# Patient Record
Sex: Female | Born: 1990 | Race: White | Hispanic: No | Marital: Single | Smoking: Current every day smoker
Health system: Southern US, Community
[De-identification: ages and names within clinical notes are randomized; demographics above are authoritative.]

## PROBLEM LIST (undated history)

## (undated) ENCOUNTER — Inpatient Hospital Stay (HOSPITAL_COMMUNITY): Payer: Self-pay

## (undated) ENCOUNTER — Ambulatory Visit (HOSPITAL_COMMUNITY): Admission: EM | Disposition: A | Discharge status: Home / Self Care | Payer: Medicaid Other

## (undated) DIAGNOSIS — B999 Unspecified infectious disease: Secondary | ICD-10-CM

## (undated) DIAGNOSIS — G43909 Migraine, unspecified, not intractable, without status migrainosus: Secondary | ICD-10-CM

## (undated) DIAGNOSIS — D649 Anemia, unspecified: Secondary | ICD-10-CM

## (undated) DIAGNOSIS — J302 Other seasonal allergic rhinitis: Secondary | ICD-10-CM

## (undated) DIAGNOSIS — J45909 Unspecified asthma, uncomplicated: Secondary | ICD-10-CM

## (undated) DIAGNOSIS — S62101A Fracture of unspecified carpal bone, right wrist, initial encounter for closed fracture: Secondary | ICD-10-CM

## (undated) DIAGNOSIS — Z9109 Other allergy status, other than to drugs and biological substances: Secondary | ICD-10-CM

## (undated) HISTORY — PX: INDUCED ABORTION: SHX677

---
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
## 2011-02-02 LAB — URINALYSIS, ROUTINE W REFLEX MICROSCOPIC
Bilirubin Urine: NEGATIVE
Glucose, UA: NEGATIVE mg/dL
Ketones, ur: 15 mg/dL — AB
Protein, ur: NEGATIVE mg/dL
pH: 7.5 (ref 5.0–8.0)

## 2011-02-02 LAB — URINE MICROSCOPIC-ADD ON

## 2011-02-03 LAB — URINE CULTURE
Colony Count: 7000
Culture  Setup Time: 201112110108

## 2011-02-03 LAB — HCG, QUANTITATIVE, PREGNANCY: hCG, Beta Chain, Quant, S: <2 m[IU]/mL (ref <5)

## 2011-02-03 LAB — POCT I-STAT, CHEM 8
BUN: 12 mg/dL (ref 6–23)
Hemoglobin: 14.3 g/dL (ref 12.0–15.0)
Potassium: 3.7 mEq/L (ref 3.5–5.1)
Sodium: 142 mEq/L (ref 135–145)
TCO2: 27 mmol/L (ref 0–100)

## 2011-02-03 LAB — DIFFERENTIAL
Basophils Absolute: 0 10*3/uL (ref 0.0–0.1)
Lymphocytes Relative: 23 % (ref 12–46)
Lymphocytes Relative: 23 % (ref 12–46)
Lymphs Abs: 1.5 10*3/uL (ref 0.7–4.0)
Lymphs Abs: 1.6 10*3/uL (ref 0.7–4.0)
Monocytes Absolute: 0.4 10*3/uL (ref 0.1–1.0)
Monocytes Relative: 5 % (ref 3–12)
Neutro Abs: 4.8 10*3/uL (ref 1.7–7.7)
Neutro Abs: 4.8 10*3/uL (ref 1.7–7.7)
Neutrophils Relative %: 71 % (ref 43–77)

## 2011-02-03 LAB — WET PREP, GENITAL
Trich, Wet Prep: NONE SEEN
Yeast Wet Prep HPF POC: NONE SEEN

## 2011-02-03 LAB — CBC
Hemoglobin: 13.2 g/dL (ref 12.0–15.0)
MCH: 29.3 pg (ref 26.0–34.0)
MCV: 88.6 fL (ref 78.0–100.0)
Platelets: 213 10*3/uL (ref 150–400)
Platelets: 213 10*3/uL (ref 150–400)
RBC: 4.47 MIL/uL (ref 3.87–5.11)
RDW: 13.1 % (ref 11.5–15.5)

## 2011-02-03 LAB — APTT: aPTT: 23 seconds — ABNORMAL LOW (ref 24–37)

## 2011-02-03 LAB — BASIC METABOLIC PANEL
BUN: 9 mg/dL (ref 6–23)
Calcium: 8.9 mg/dL (ref 8.4–10.5)
Chloride: 104 mEq/L (ref 96–112)
Creatinine, Ser: 0.78 mg/dL (ref 0.4–1.2)
GFR calc Af Amer: >60 mL/min (ref >60)
GFR calc non Af Amer: >60 mL/min (ref >60)
Glucose, Bld: 91 mg/dL (ref 70–99)
Potassium: 3.8 mEq/L (ref 3.5–5.1)

## 2011-02-03 LAB — URINALYSIS, ROUTINE W REFLEX MICROSCOPIC
Glucose, UA: NEGATIVE mg/dL
Protein, ur: 30 mg/dL — AB
Specific Gravity, Urine: 1.029 (ref 1.005–1.030)
pH: 6 (ref 5.0–8.0)

## 2011-02-03 LAB — URINE MICROSCOPIC-ADD ON

## 2011-02-03 LAB — PROTIME-INR
INR: 1.02 (ref 0.00–1.49)
Prothrombin Time: 13.6 seconds (ref 11.6–15.2)

## 2011-02-03 LAB — POCT PREGNANCY, URINE: Preg Test, Ur: NEGATIVE

## 2011-02-03 LAB — GC/CHLAMYDIA PROBE AMP, GENITAL: GC Probe Amp, Genital: NEGATIVE

## 2011-02-03 LAB — ABO/RH: ABO/RH(D): O NEG

## 2011-02-08 LAB — WET PREP, GENITAL
Clue Cells Wet Prep HPF POC: NONE SEEN
Trich, Wet Prep: NONE SEEN

## 2011-02-08 LAB — URINALYSIS, ROUTINE W REFLEX MICROSCOPIC
Ketones, ur: 40 mg/dL — AB
Specific Gravity, Urine: 1.025 (ref 1.005–1.030)
pH: 5.5 (ref 5.0–8.0)

## 2011-02-08 LAB — GC/CHLAMYDIA PROBE AMP, GENITAL: GC Probe Amp, Genital: NEGATIVE

## 2011-02-08 LAB — URINE CULTURE

## 2011-02-08 LAB — URINE MICROSCOPIC-ADD ON

## 2011-03-03 LAB — CBC
HCT: 36.8 % (ref 36.0–49.0)
Platelets: 232 10*3/uL (ref 150–400)
WBC: 7.2 10*3/uL (ref 4.5–13.5)

## 2011-03-03 LAB — COMPREHENSIVE METABOLIC PANEL
AST: 23 U/L (ref 0–37)
Albumin: 4.2 g/dL (ref 3.5–5.2)
Alkaline Phosphatase: 67 U/L (ref 47–119)
BUN: 11 mg/dL (ref 6–23)
Chloride: 103 mEq/L (ref 96–112)
Potassium: 3.9 mEq/L (ref 3.5–5.1)
Total Bilirubin: 0.6 mg/dL (ref 0.3–1.2)

## 2011-03-03 LAB — DIFFERENTIAL
Basophils Absolute: 0.1 10*3/uL (ref 0.0–0.1)
Basophils Relative: 1 % (ref 0–1)
Eosinophils Absolute: 0.1 10*3/uL (ref 0.0–1.2)
Eosinophils Relative: 2 % (ref 0–5)
Monocytes Absolute: 0.5 10*3/uL (ref 0.2–1.2)
Neutro Abs: 4.2 10*3/uL (ref 1.7–8.0)

## 2011-03-03 LAB — URINALYSIS, ROUTINE W REFLEX MICROSCOPIC
Bilirubin Urine: NEGATIVE
Hgb urine dipstick: NEGATIVE
Ketones, ur: 15 mg/dL — AB
Protein, ur: NEGATIVE mg/dL
Specific Gravity, Urine: 1.027 (ref 1.005–1.030)
Urobilinogen, UA: 0.2 mg/dL (ref 0.0–1.0)

## 2011-03-03 LAB — URINE MICROSCOPIC-ADD ON

## 2011-03-03 LAB — PREGNANCY, URINE: Preg Test, Ur: NEGATIVE

## 2011-03-09 ENCOUNTER — Inpatient Hospital Stay (INDEPENDENT_AMBULATORY_CARE_PROVIDER_SITE_OTHER)
Admission: RE | Admit: 2011-03-09 | Discharge: 2011-03-09 | Disposition: A | Discharge status: Home / Self Care | Payer: Medicaid Other | Source: Ambulatory Visit | Attending: Family Medicine | Admitting: Family Medicine

## 2011-03-09 DIAGNOSIS — J40 Bronchitis, not specified as acute or chronic: Secondary | ICD-10-CM

## 2011-04-25 ENCOUNTER — Ambulatory Visit (INDEPENDENT_AMBULATORY_CARE_PROVIDER_SITE_OTHER): Payer: Medicaid Other

## 2011-04-25 ENCOUNTER — Inpatient Hospital Stay (INDEPENDENT_AMBULATORY_CARE_PROVIDER_SITE_OTHER)
Admission: RE | Admit: 2011-04-25 | Discharge: 2011-04-25 | Disposition: A | Discharge status: Home / Self Care | Payer: Medicaid Other | Source: Ambulatory Visit | Attending: Family Medicine | Admitting: Family Medicine

## 2011-04-25 DIAGNOSIS — S60229A Contusion of unspecified hand, initial encounter: Secondary | ICD-10-CM

## 2011-04-25 IMAGING — CR DG HAND COMPLETE 3+V*R*
3 series · 3 of 3 positions shown · non-contrast
Comparison: [DATE]

CLINICAL DATA: Injury to the right hand.  Pain across the
metacarpal bones.  History of right wrist fracture.

RIGHT HAND - COMPLETE 3+ VIEW

[view not recorded (1 of 3)]
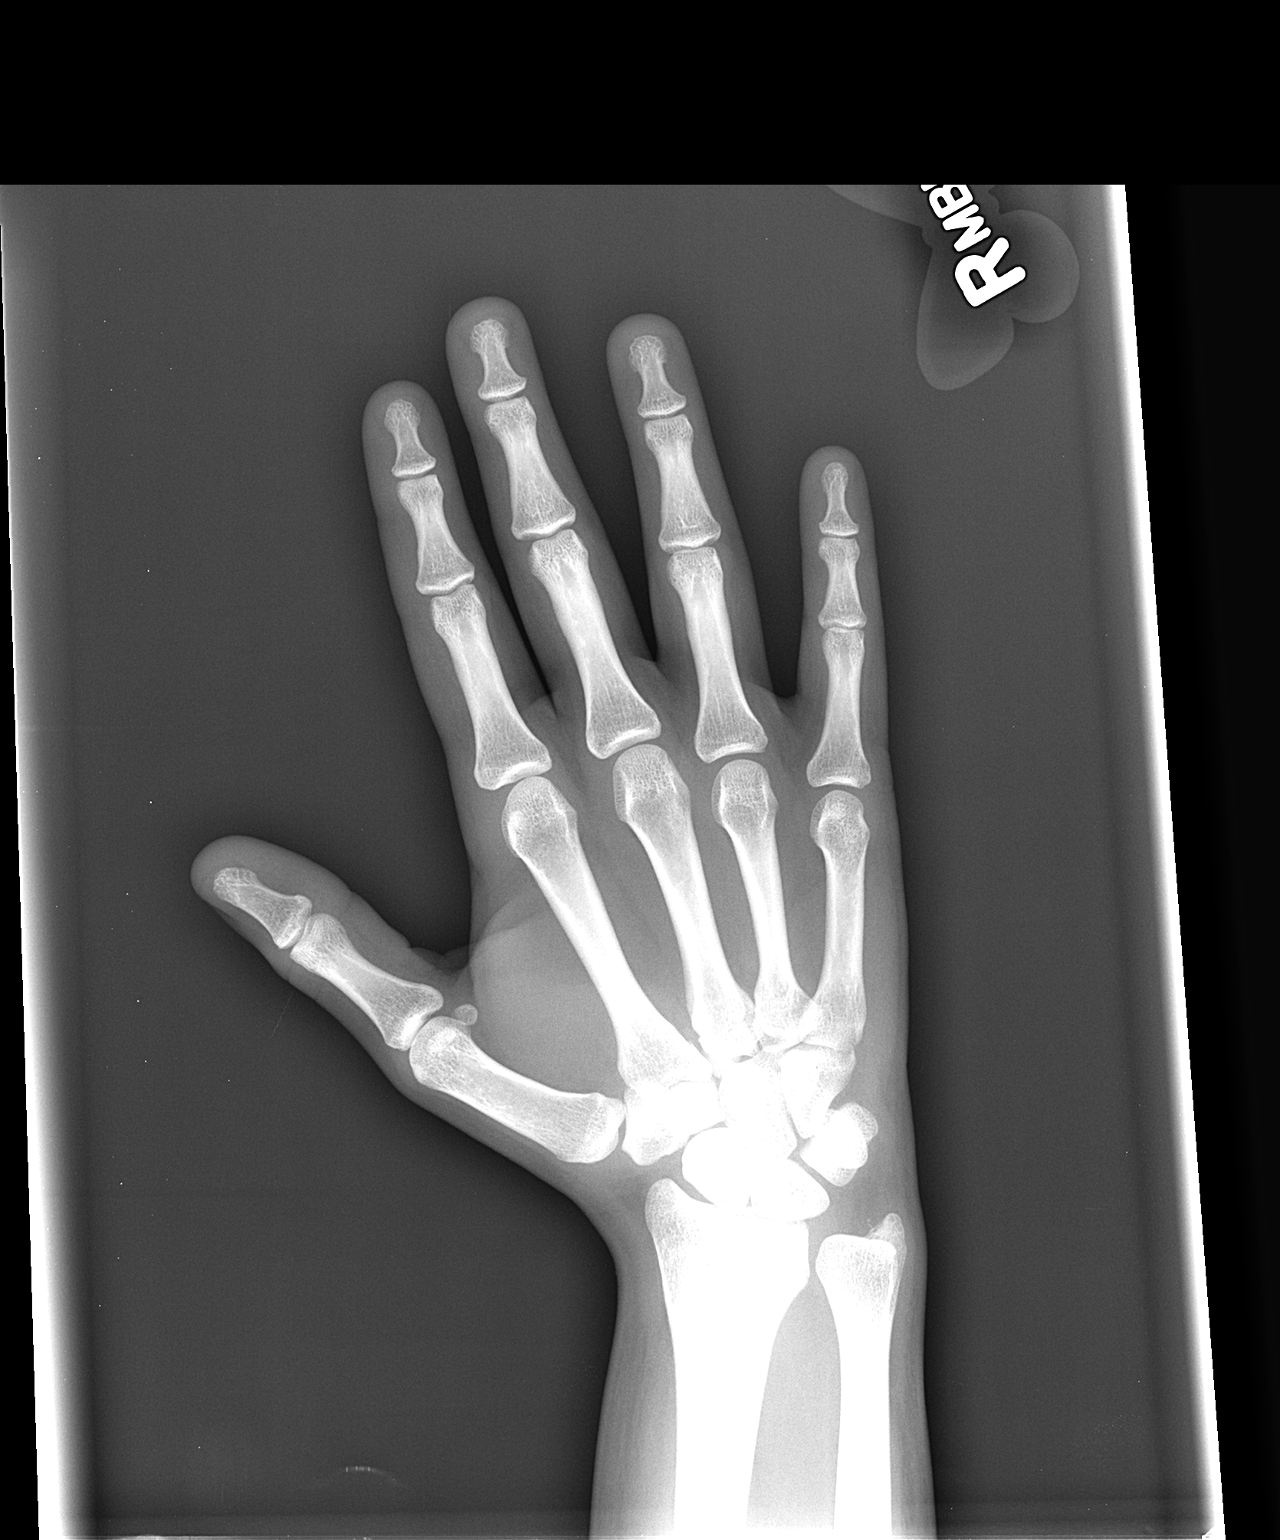

[view not recorded (2 of 3)]
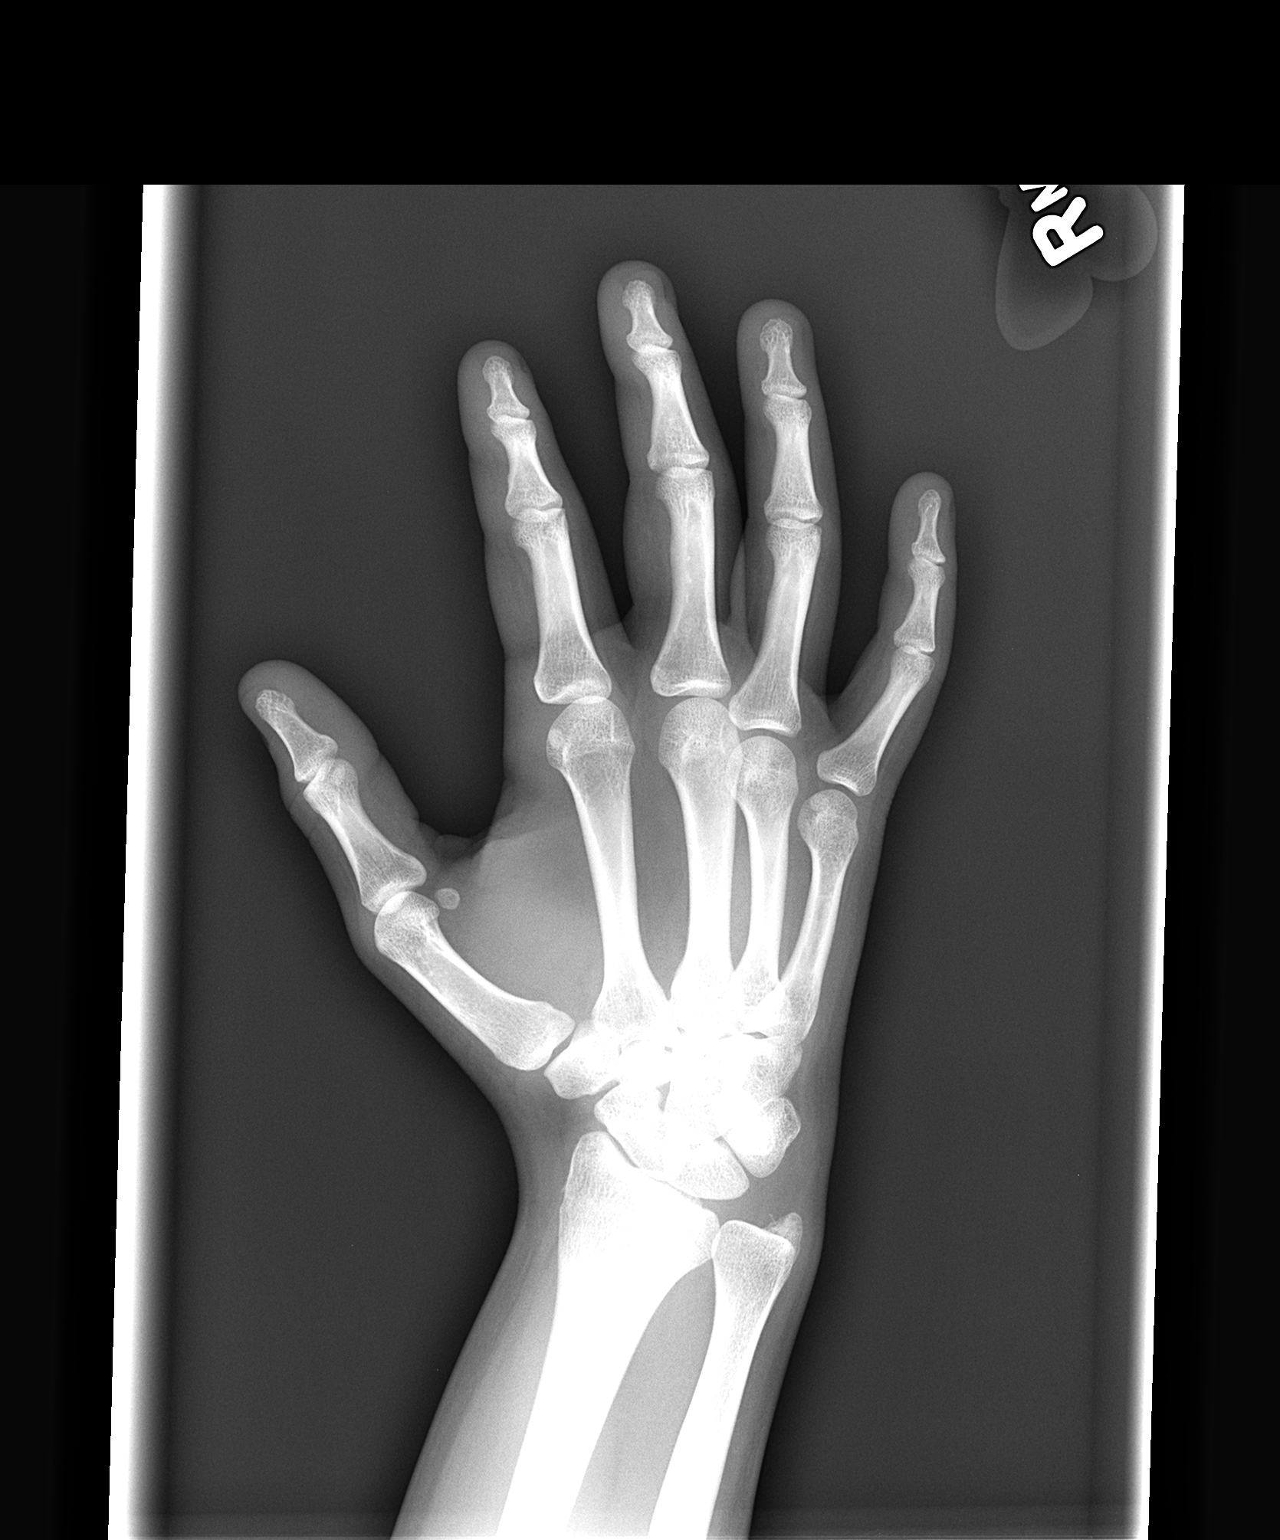

[view not recorded (3 of 3)]
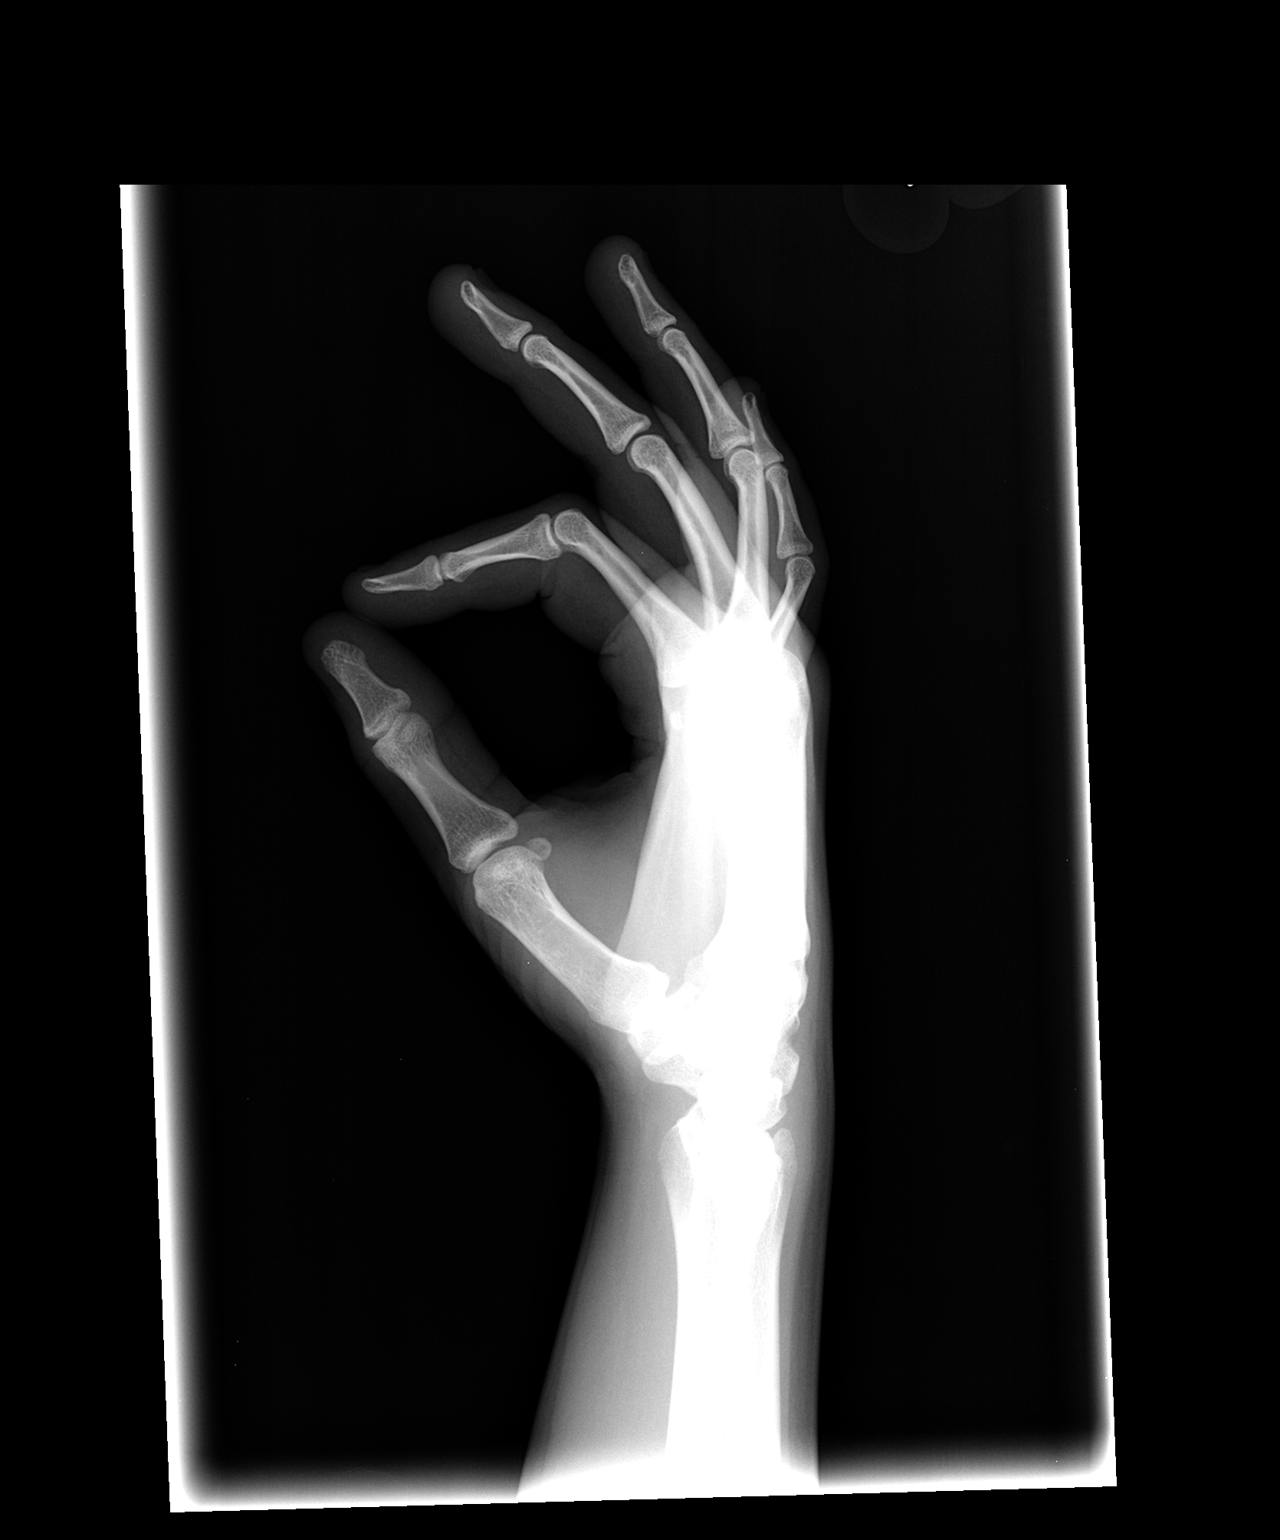

[3 of 3 positions shown; findings below may reference images not displayed]

FINDINGS: The distal radial and ulnar fractures have healed.  Small
ossifications along the ulnar styloid are probably related to
previous fracture.  No evidence for acute fracture or dislocation.
IMPRESSION: No acute osseous abnormality to the right hand.

## 2011-05-05 ENCOUNTER — Observation Stay (HOSPITAL_COMMUNITY)
Admission: EM | Admit: 2011-05-05 | Discharge: 2011-05-06 | Disposition: A | Discharge status: Home / Self Care | Payer: Medicaid Other | Attending: Emergency Medicine | Admitting: Emergency Medicine

## 2011-05-05 DIAGNOSIS — R21 Rash and other nonspecific skin eruption: Secondary | ICD-10-CM | POA: Insufficient documentation

## 2011-05-05 DIAGNOSIS — R197 Diarrhea, unspecified: Secondary | ICD-10-CM | POA: Insufficient documentation

## 2011-05-05 DIAGNOSIS — R112 Nausea with vomiting, unspecified: Principal | ICD-10-CM | POA: Insufficient documentation

## 2011-05-05 DIAGNOSIS — E860 Dehydration: Secondary | ICD-10-CM | POA: Insufficient documentation

## 2011-05-05 DIAGNOSIS — A938 Other specified arthropod-borne viral fevers: Secondary | ICD-10-CM | POA: Insufficient documentation

## 2011-05-05 LAB — POCT I-STAT, CHEM 8
Chloride: 106 mEq/L (ref 96–112)
HCT: 43 % (ref 36.0–46.0)
Hemoglobin: 14.6 g/dL (ref 12.0–15.0)
Potassium: 3.5 mEq/L (ref 3.5–5.1)

## 2011-05-05 LAB — BASIC METABOLIC PANEL
CO2: 24 mEq/L (ref 19–32)
CO2: 24 mEq/L (ref 19–32)
Chloride: 103 mEq/L (ref 96–112)
Creatinine, Ser: 0.49 mg/dL (ref 0.4–1.2)
GFR calc Af Amer: >60 mL/min (ref >60)
GFR calc non Af Amer: >60 mL/min (ref >60)
Glucose, Bld: 92 mg/dL (ref 70–99)
Potassium: 3.2 mEq/L — ABNORMAL LOW (ref 3.5–5.1)
Potassium: 3.1 mEq/L — ABNORMAL LOW (ref 3.5–5.1)
Sodium: 135 mEq/L (ref 135–145)

## 2011-05-05 LAB — URINE MICROSCOPIC-ADD ON

## 2011-05-05 LAB — DIFFERENTIAL
Basophils Absolute: 0 10*3/uL (ref 0.0–0.1)
Basophils Absolute: 0 10*3/uL (ref 0.0–0.1)
Basophils Relative: 0 % (ref 0–1)
Eosinophils Absolute: 0 10*3/uL (ref 0.0–0.7)
Lymphocytes Relative: 4 % — ABNORMAL LOW (ref 12–46)
Lymphs Abs: 0.4 10*3/uL — ABNORMAL LOW (ref 0.7–4.0)
Neutro Abs: 4.9 10*3/uL (ref 1.7–7.7)
Neutrophils Relative %: 92 % — ABNORMAL HIGH (ref 43–77)
Neutrophils Relative %: 79 % — ABNORMAL HIGH (ref 43–77)

## 2011-05-05 LAB — URINALYSIS, ROUTINE W REFLEX MICROSCOPIC
Glucose, UA: NEGATIVE mg/dL
Ketones, ur: >80 mg/dL — AB
pH: 5.5 (ref 5.0–8.0)

## 2011-05-05 LAB — CBC
Hemoglobin: 11.6 g/dL — ABNORMAL LOW (ref 12.0–15.0)
MCV: 89.9 fL (ref 78.0–100.0)
Platelets: 183 10*3/uL (ref 150–400)
RBC: 4.44 MIL/uL (ref 3.87–5.11)
RBC: 3.91 MIL/uL (ref 3.87–5.11)
WBC: 8.8 10*3/uL (ref 4.0–10.5)
WBC: 6.3 10*3/uL (ref 4.0–10.5)

## 2011-05-08 ENCOUNTER — Other Ambulatory Visit: Payer: Self-pay | Admitting: Obstetrics and Gynecology

## 2011-05-08 DIAGNOSIS — N6459 Other signs and symptoms in breast: Secondary | ICD-10-CM

## 2011-05-13 ENCOUNTER — Ambulatory Visit
Admission: RE | Admit: 2011-05-13 | Discharge: 2011-05-13 | Disposition: A | Discharge status: Home / Self Care | Payer: Medicaid Other | Source: Ambulatory Visit | Attending: Obstetrics and Gynecology | Admitting: Obstetrics and Gynecology

## 2011-05-13 DIAGNOSIS — N6459 Other signs and symptoms in breast: Secondary | ICD-10-CM

## 2011-05-22 ENCOUNTER — Other Ambulatory Visit: Payer: Self-pay | Admitting: Orthopedic Surgery

## 2011-05-22 DIAGNOSIS — M25531 Pain in right wrist: Secondary | ICD-10-CM

## 2011-06-01 ENCOUNTER — Other Ambulatory Visit: Payer: Medicaid Other

## 2011-08-24 HISTORY — PX: DILATION AND CURETTAGE OF UTERUS: SHX78

## 2011-11-07 ENCOUNTER — Encounter: Payer: Self-pay | Admitting: Emergency Medicine

## 2011-11-07 ENCOUNTER — Emergency Department (HOSPITAL_COMMUNITY)
Admission: EM | Admit: 2011-11-07 | Discharge: 2011-11-07 | Disposition: A | Discharge status: Home / Self Care | Payer: Self-pay | Attending: Emergency Medicine | Admitting: Emergency Medicine

## 2011-11-07 DIAGNOSIS — F17200 Nicotine dependence, unspecified, uncomplicated: Secondary | ICD-10-CM | POA: Insufficient documentation

## 2011-11-07 DIAGNOSIS — R1032 Left lower quadrant pain: Secondary | ICD-10-CM | POA: Insufficient documentation

## 2011-11-07 DIAGNOSIS — R319 Hematuria, unspecified: Secondary | ICD-10-CM | POA: Insufficient documentation

## 2011-11-07 DIAGNOSIS — R10819 Abdominal tenderness, unspecified site: Secondary | ICD-10-CM | POA: Insufficient documentation

## 2011-11-07 DIAGNOSIS — N390 Urinary tract infection, site not specified: Secondary | ICD-10-CM | POA: Insufficient documentation

## 2011-11-07 HISTORY — DX: Other allergy status, other than to drugs and biological substances: Z91.09

## 2011-11-07 HISTORY — DX: Other seasonal allergic rhinitis: J30.2

## 2011-11-07 LAB — POCT PREGNANCY, URINE: Preg Test, Ur: NEGATIVE

## 2011-11-07 LAB — URINE MICROSCOPIC-ADD ON

## 2011-11-07 LAB — URINALYSIS, ROUTINE W REFLEX MICROSCOPIC
Specific Gravity, Urine: 1.017 (ref 1.005–1.030)
Urobilinogen, UA: 0.2 mg/dL (ref 0.0–1.0)

## 2011-11-07 MED ORDER — CEFTRIAXONE SODIUM 1 G IJ SOLR
1.0000 g | Freq: Once | INTRAMUSCULAR | Status: AC
  Administered 2011-11-07: 1 g via INTRAMUSCULAR
  Filled 2011-11-07: qty 10

## 2011-11-07 MED ORDER — PHENAZOPYRIDINE HCL 200 MG PO TABS
200.0000 mg | ORAL_TABLET | Freq: Once | ORAL | Status: AC
  Administered 2011-11-07: 200 mg via ORAL
  Filled 2011-11-07: qty 1

## 2011-11-07 MED ORDER — CEPHALEXIN 500 MG PO CAPS: 500.0000 mg | ORAL_CAPSULE | Freq: Three times a day (TID) | ORAL | Status: AC

## 2011-11-07 MED ORDER — PHENAZOPYRIDINE HCL 200 MG PO TABS: 200.0000 mg | ORAL_TABLET | Freq: Three times a day (TID) | ORAL | Status: AC

## 2011-11-07 MED ORDER — LIDOCAINE HCL 1 % IJ SOLN
INTRAMUSCULAR | Status: AC
  Administered 2011-11-07: 2 mL
  Filled 2011-11-07: qty 20

## 2011-11-07 MED ORDER — KETOROLAC TROMETHAMINE 60 MG/2ML IM SOLN
60.0000 mg | Freq: Once | INTRAMUSCULAR | Status: AC
  Administered 2011-11-07: 60 mg via INTRAMUSCULAR
  Filled 2011-11-07: qty 2

## 2011-11-07 NOTE — ED Notes (Signed)
Pt states that she has been feeling lower back pain on her left side for approximately 5 days and bleeding while she urinates for the past 4 days. Her urine today was bright red and has now turned a brownish color. Patient states that the pain has awoken her from sleep. Pain at this time 8/10. Describes the pain as a shooting pain in her back. States no hx of UTI's but she does have hx of gallbladder issues. Patient in NAD, VSS.

## 2011-11-07 NOTE — ED Provider Notes (Signed)
History     CSN: [id number] Arrival date & time: 11/07/2011  4:36 PM   First MD Initiated Contact with Patient 11/07/11 1750      Chief Complaint  Patient presents with  . Hematuria  . Back Pain    (Consider location/radiation/quality/duration/timing/severity/associated sxs/prior treatment) HPI  Patient relates about 4 days ago she started seeing some blood in her urine is getting heavier today. Chest some pain in her left lower back. She has dysuria, frequency, dribbling, but states she's not having any nausea, vomiting or fever. She states she was seen at [facility] 2-3 times last year for blood in her urine and states "they just told me to go home". Patient had CT abdomen pelvis done July 2001 which was normal. She states her flank pain hurts with range of motion.  Primary care physician none   Past Medical History  Diagnosis Date  . Seasonal allergies   . Environmental allergies     Past Surgical History  Procedure Date  . Cesarean section 2008  . Dilation and curettage of uterus 08/2011    No family history on file.  History  Substance Use Topics  . Smoking status: Current Everyday Smoker -- 0.5 packs/day    Types: Cigarettes  . Smokeless tobacco: Not on file  . Alcohol Use: No   student and works part-time  OB History    Grav Para Term Preterm Abortions TAB SAB Ect Mult Living                  Review of Systems  All other systems reviewed and are negative.    Allergies  Review of patient's allergies indicates no known allergies.  Home Medications   Current Outpatient Rx  Name Route Sig Dispense Refill  . FLUCONAZOLE 100 MG PO TABS Oral Take 100 mg by mouth daily.        BP 113/60  Pulse 78  Temp(Src) 98 F (36.7 C) (Oral)  SpO2 99%  LMP 10/29/2011 Vital signs normal  Physical Exam  Nursing note and vitals reviewed. Constitutional: She is oriented to person, place, and time. She appears well-developed and well-nourished.  Non-toxic  appearance. She does not appear ill. No distress.  HENT:  Head: Normocephalic and atraumatic.  Right Ear: External ear normal.  Left Ear: External ear normal.  Nose: Nose normal. No mucosal edema or rhinorrhea.  Mouth/Throat: Oropharynx is clear and moist and mucous membranes are normal. No dental abscesses or uvula swelling.  Eyes: Conjunctivae and EOM are normal. Pupils are equal, round, and reactive to light.  Neck: Normal range of motion and full passive range of motion without pain. Neck supple.  Cardiovascular: Normal rate, regular rhythm and normal heart sounds.  Exam reveals no gallop and no friction rub.   No murmur heard. Pulmonary/Chest: Effort normal and breath sounds normal. No respiratory distress. She has no wheezes. She has no rhonchi. She has no rales. She exhibits no tenderness and no crepitus.  Abdominal: Soft. Normal appearance and bowel sounds are normal. She exhibits no distension. There is tenderness. There is no rebound and no guarding.       Patient has some tenderness to palpation over the bladder there is no guarding or rebound.  Genitourinary:       Patient has mild left lower flank pain to percussion.  Musculoskeletal: Normal range of motion. She exhibits no edema and no tenderness.       Moves all extremities well.   Neurological: She is alert  and oriented to person, place, and time. She has normal strength. No cranial nerve deficit.  Skin: Skin is warm, dry and intact. No rash noted. No erythema. No pallor.  Psychiatric: She has a normal mood and affect. Her speech is normal and behavior is normal. Her mood appears not anxious.    ED Course  Procedures (including critical care time)  1850 patient's boyfriend has arrived and gave patient food she was given Toradol 60 mg IM and Rocephin 1 g IM.   Results for orders placed during the hospital encounter of 11/07/11  URINALYSIS, ROUTINE W REFLEX MICROSCOPIC      Component Value Range   Color, Urine AMBER (*)  YELLOW    APPearance TURBID (*) CLEAR    Specific Gravity, Urine 1.017  1.005 - 1.030    pH 6.0  5.0 - 8.0    Glucose, UA NEGATIVE  NEGATIVE (mg/dL)   Hgb urine dipstick LARGE (*) NEGATIVE    Bilirubin Urine NEGATIVE  NEGATIVE    Ketones, ur NEGATIVE  NEGATIVE (mg/dL)   Protein, ur 30 (*) NEGATIVE (mg/dL)   Urobilinogen, UA 0.2  0.0 - 1.0 (mg/dL)   Nitrite POSITIVE (*) NEGATIVE    Leukocytes, UA LARGE (*) NEGATIVE   POCT PREGNANCY, URINE      Component Value Range   Preg Test, Ur NEGATIVE    URINE MICROSCOPIC-ADD ON      Component Value Range   Squamous Epithelial / LPF FEW (*) RARE    WBC, UA TOO NUMEROUS TO COUNT  <3 (WBC/hpf)   RBC / HPF TOO NUMEROUS TO COUNT  <3 (RBC/hpf)   Bacteria, UA MANY (*) RARE    Laboratory analysis consistent with a UTI  Diagnoses that have been ruled out:  Diagnoses that are still under consideration:  Final diagnoses:  Urinary tract infection   New Prescriptions   CEPHALEXIN (KEFLEX) 500 MG CAPSULE    Take 1 capsule (500 mg total) by mouth 3 (three) times daily.   PHENAZOPYRIDINE (PYRIDIUM) 200 MG TABLET    Take 1 tablet (200 mg total) by mouth 3 (three) times daily.    Plan discharge  Devoria Albe, MD, FACEP   MDM          Ward Givens, MD 11/07/11 [phone]

## 2011-11-07 NOTE — ED Notes (Signed)
Lower left back pain x1 week, urinating blood x 3-4 days worse today

## 2011-11-09 LAB — URINE CULTURE
Colony Count: >=100000
Culture  Setup Time: 201212160150

## 2011-11-10 NOTE — ED Notes (Signed)
+   Urine Patient treated with Keflex-sensitive to same-chart appended per protocol MD. 

## 2012-10-31 ENCOUNTER — Inpatient Hospital Stay (HOSPITAL_COMMUNITY)
Admission: AD | Admit: 2012-10-31 | Discharge: 2012-10-31 | Disposition: A | Discharge status: Hospice | Payer: Medicaid Other | Source: Ambulatory Visit | Attending: Obstetrics & Gynecology | Admitting: Obstetrics & Gynecology

## 2012-10-31 ENCOUNTER — Encounter (HOSPITAL_COMMUNITY): Payer: Self-pay | Admitting: *Deleted

## 2012-10-31 DIAGNOSIS — O468X9 Other antepartum hemorrhage, unspecified trimester: Secondary | ICD-10-CM

## 2012-10-31 DIAGNOSIS — O210 Mild hyperemesis gravidarum: Secondary | ICD-10-CM | POA: Insufficient documentation

## 2012-10-31 DIAGNOSIS — O219 Vomiting of pregnancy, unspecified: Secondary | ICD-10-CM

## 2012-10-31 DIAGNOSIS — O0930 Supervision of pregnancy with insufficient antenatal care, unspecified trimester: Secondary | ICD-10-CM | POA: Insufficient documentation

## 2012-10-31 HISTORY — DX: Anemia, unspecified: D64.9

## 2012-10-31 LAB — URINALYSIS, ROUTINE W REFLEX MICROSCOPIC
Ketones, ur: NEGATIVE mg/dL
Leukocytes, UA: NEGATIVE
Nitrite: NEGATIVE
Protein, ur: NEGATIVE mg/dL
Urobilinogen, UA: 0.2 mg/dL (ref 0.0–1.0)
pH: 7.5 (ref 5.0–8.0)

## 2012-10-31 LAB — CBC
MCH: 30.6 pg (ref 26.0–34.0)
MCHC: 33.6 g/dL (ref 30.0–36.0)
Platelets: 194 10*3/uL (ref 150–400)
RDW: 12.5 % (ref 11.5–15.5)

## 2012-10-31 LAB — HCG, QUANTITATIVE, PREGNANCY: hCG, Beta Chain, Quant, S: 32512 m[IU]/mL — ABNORMAL HIGH (ref <5)

## 2012-10-31 MED ORDER — PROMETHAZINE HCL 12.5 MG PO TABS: 12.5000 mg | ORAL_TABLET | Freq: Four times a day (QID) | ORAL | Status: DC | PRN

## 2012-10-31 MED ORDER — PROMETHAZINE HCL 25 MG PO TABS
12.5000 mg | ORAL_TABLET | Freq: Once | ORAL | Status: AC
  Administered 2012-10-31: 12.5 mg via ORAL
  Filled 2012-10-31: qty 1

## 2012-10-31 NOTE — MAU Note (Signed)
Pt says she missed her period in October, spotted for three days in November and one day in December.  Has had mid bilateral pain in her abdomen for the past two weeks she thinks is related to vomiting throughout the day every day.  Denies any bleeding or spotting.

## 2012-10-31 NOTE — MAU Note (Signed)
+  HPT one month ago, vomitting for past 2 weeks, much worse now.  Mid abd pain x 2 days.  Denies vag bleeding.

## 2012-10-31 NOTE — ED Provider Notes (Signed)
History     CSN: [id number]  Arrival date and time: 10/31/12 1603  HPI Michele Avila is a 21 y.o. G62P1021 female of unknown gestational age presenting with c/o N/V and abdominal pain off and on for the past 2 weeks. The patient states that these symptoms are mostly in the morning when they occur however they do last throughout the day on occasion. Today she was nauseous and fatigued all day and vomited x 4. The patient also describes a loose stool this morning. She has had loose stools approximately 3 times over the past 2 weeks. She has also had periods of constipation. The patient has not started [facility] for this pregnancy yet, but states that she would like to contact Dr. Henderson Cloud with [facility] when she gets her medicaid approved.   She denies pelvic pain or pressure. She does not have any LOF, abnormal discharge or VB today. She does describe 2 episodes of spotting that would have been consistent with her periods the last two months. The most recent of which was a month ago. Small amount of spotting noticed with wiping. No bright red blood seen.   Chief Complaint  Patient presents with  . Emesis  . Abdominal Pain     OB History    Grav Para Term Preterm Abortions TAB SAB Ect Mult Living   4 1 1  2 2    1        Past Medical History  Diagnosis Date  . Seasonal allergies   . Environmental allergies     Past Surgical History  Procedure Date  . Cesarean section 2008  . Dilation and curettage of uterus 08/2011    Family History  Problem Relation Age of Onset  . Hypertension Mother   . Cancer Father     liver  . Diabetes Father   . Hypertension Father   . Hypertension Maternal Grandmother   . Kidney disease Maternal Grandmother   . Diabetes Paternal Grandmother   . Hypertension Paternal Grandmother     History   Social History  . Marital Status: Single    Spouse Name: N/A    Number of Children: N/A  . Years of Education: N/A   Occupational History  . Not on  file.   Social History Main Topics  . Smoking status: Former Smoker -- 0.5 packs/day    Types: Cigarettes    Quit date: 08/31/2012  . Smokeless tobacco: Never Used  . Alcohol Use: No  . Drug Use: Yes    Special: Marijuana     Comment: not since before she found out she was pregnant  . Sexually Active: Yes   Other Topics Concern  . Not on file   Social History Narrative  . No narrative on file    Allergies: No Known Allergies  Prescriptions prior to admission  Medication Sig Dispense Refill  . fluconazole (DIFLUCAN) 100 MG tablet Take 100 mg by mouth daily.          Review of Systems   Physical Exam   Blood pressure 129/68, pulse 88, temperature 97.9 F (36.6 C), temperature source Oral, resp. rate 20, height 5\' 7"  (1.702 m), weight 167 lb 12.8 oz (76.114 kg).  Physical Exam  Constitutional: She is oriented to person, place, and time. She appears well-developed and well-nourished. No distress.  HENT:  Head: Normocephalic.  Cardiovascular: Normal rate.   Respiratory: Effort normal.  GI: Soft. She exhibits no distension and no mass. There is tenderness (RUQ and  LUQ). There is no rebound and no guarding.  Neurological: She is oriented to person, place, and time.  Skin: Skin is warm and dry. No rash noted. No erythema. No pallor.  Psychiatric: She has a normal mood and affect.    MAU Course  Procedures  MDM Patient cannot afford Zofran Rx. Phenergan given in MAU. Nausea has improved with phenergan. Patient able to eat and drink here today without N/V  Assessment and Plan  A: N/V in pregnancy No prenatal care  P: Pregnancy confirmation letter given Contact information for local OB providers given. Patient informed to contact OB provider of her choice to discuss starting prenatal care.  Discussed increasing dietary fiber, water intake and starting prenatal vitamins and probiotics Rx for Phenergan e-Rx'd to [facility] at [employer].     ETHIER, Ronnika Collett  N. 10/31/2012, 5:43 PM   Freddi Starr, PA-C 11/08/12 0820  Freddi Starr, PA-C 11/08/12 [phone]

## 2012-11-08 NOTE — ED Provider Notes (Signed)
Attestation of Attending Supervision of Advanced Practitioner (CNM/NP): Evaluation and management procedures were performed by the Advanced Practitioner under my supervision and collaboration. I have reviewed the Advanced Practitioner's note and chart, and I agree with the management and plan.  Chancy Claros H. 11:04 AM

## 2012-11-13 ENCOUNTER — Inpatient Hospital Stay (HOSPITAL_COMMUNITY)
Admission: AD | Admit: 2012-11-13 | Discharge: 2012-11-13 | Disposition: A | Discharge status: Home / Self Care | Payer: Medicaid Other | Source: Ambulatory Visit | Attending: Obstetrics & Gynecology | Admitting: Obstetrics & Gynecology

## 2012-11-13 ENCOUNTER — Encounter (HOSPITAL_COMMUNITY): Payer: Self-pay | Admitting: Obstetrics and Gynecology

## 2012-11-13 ENCOUNTER — Inpatient Hospital Stay (HOSPITAL_COMMUNITY): Payer: Medicaid Other

## 2012-11-13 DIAGNOSIS — O468X9 Other antepartum hemorrhage, unspecified trimester: Secondary | ICD-10-CM

## 2012-11-13 DIAGNOSIS — O209 Hemorrhage in early pregnancy, unspecified: Secondary | ICD-10-CM | POA: Insufficient documentation

## 2012-11-13 DIAGNOSIS — R109 Unspecified abdominal pain: Secondary | ICD-10-CM | POA: Insufficient documentation

## 2012-11-13 DIAGNOSIS — O418X90 Other specified disorders of amniotic fluid and membranes, unspecified trimester, not applicable or unspecified: Secondary | ICD-10-CM

## 2012-11-13 LAB — CBC
Hemoglobin: 12 g/dL (ref 12.0–15.0)
MCH: 30.2 pg (ref 26.0–34.0)
MCHC: 33.3 g/dL (ref 30.0–36.0)
Platelets: 186 10*3/uL (ref 150–400)
RDW: 12.6 % (ref 11.5–15.5)

## 2012-11-13 LAB — HCG, QUANTITATIVE, PREGNANCY: hCG, Beta Chain, Quant, S: 72042 m[IU]/mL — ABNORMAL HIGH (ref <5)

## 2012-11-13 IMAGING — US US OB COMP LESS 14 WK
1 series · 14 of 21 positions shown · non-contrast
Comparison: None for this pregnancy

CLINICAL DATA: Pregnant, bleeding, cramping

OBSTETRIC <14 WK ULTRASOUND
TECHNIQUE: Transabdominal ultrasound was performed for evaluation
of the gestation as well as the maternal uterus and adnexal
regions.

[Series 1: us ob comp less 14 wks · 14 of 21 slices shown]
[im 1/21]
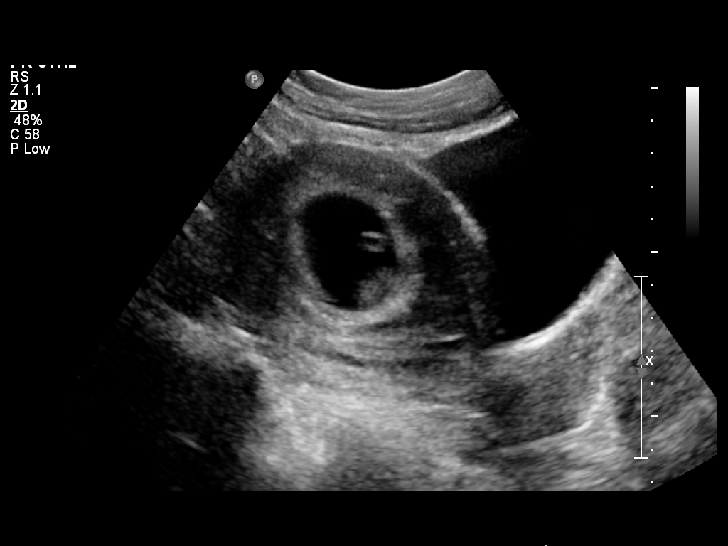
[im 3/21]
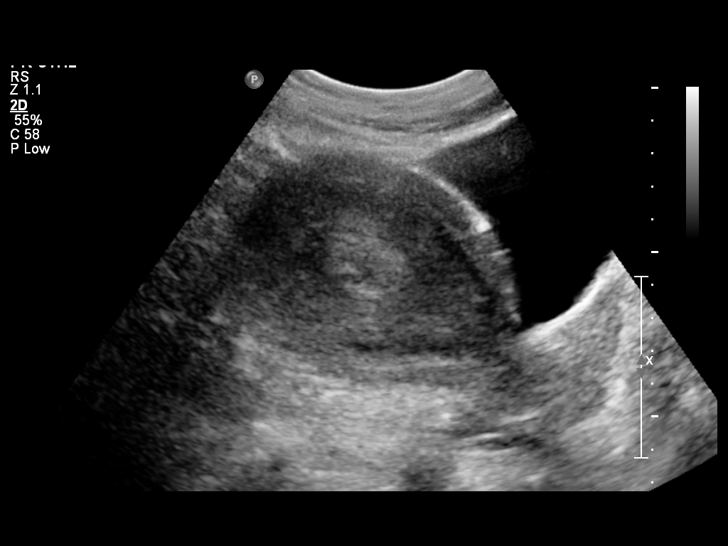
[im 4/21]
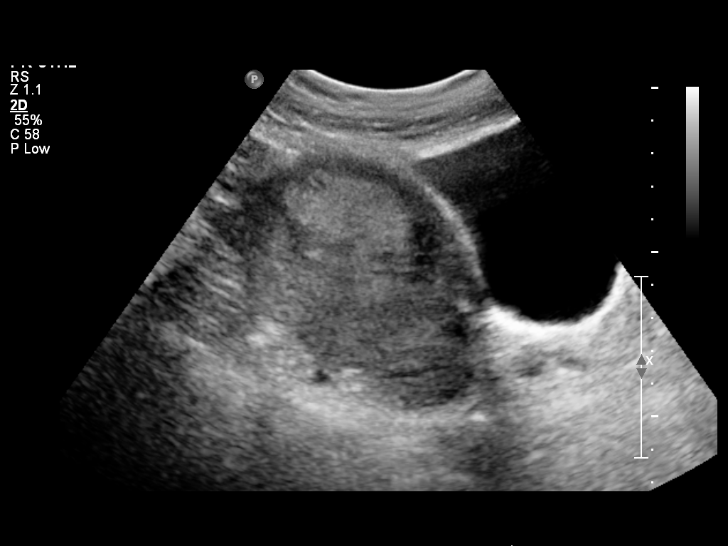
[im 6/21]
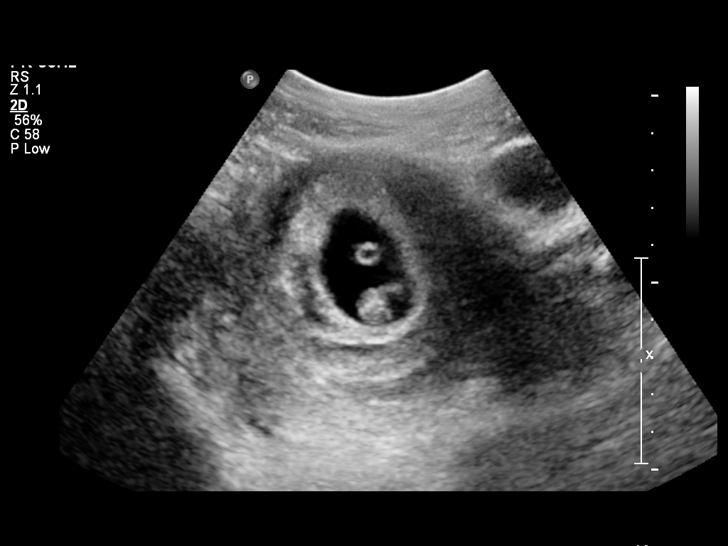
[im 7/21]
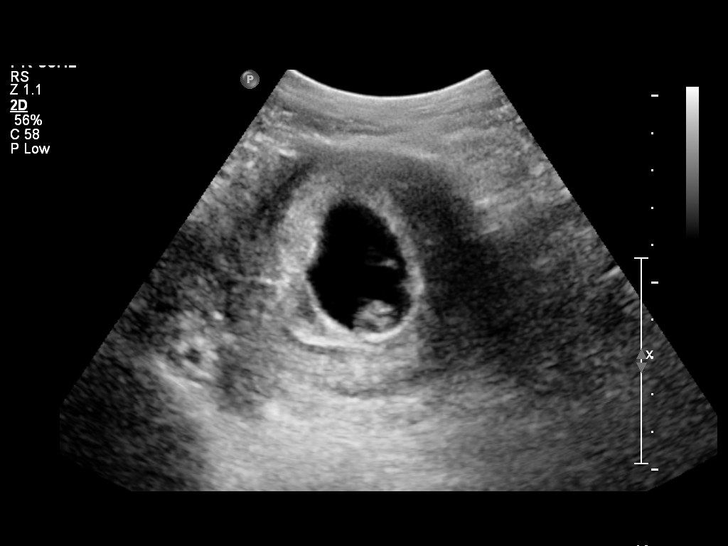
[im 9/21]
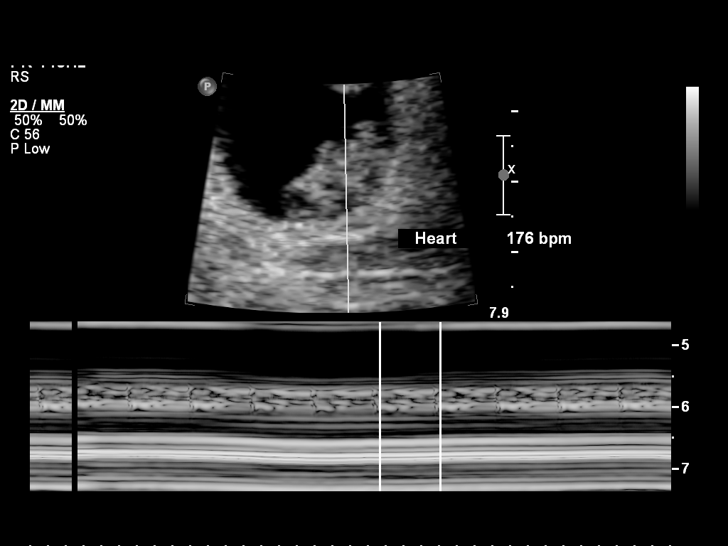
[im 10/21]
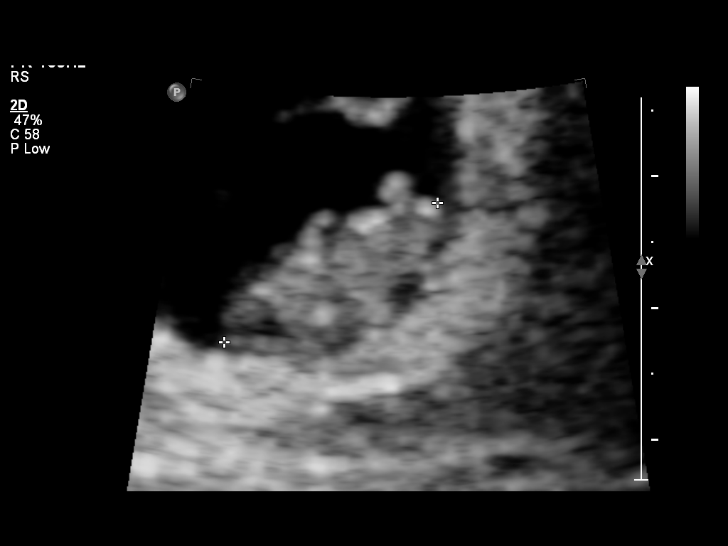
[im 12/21]
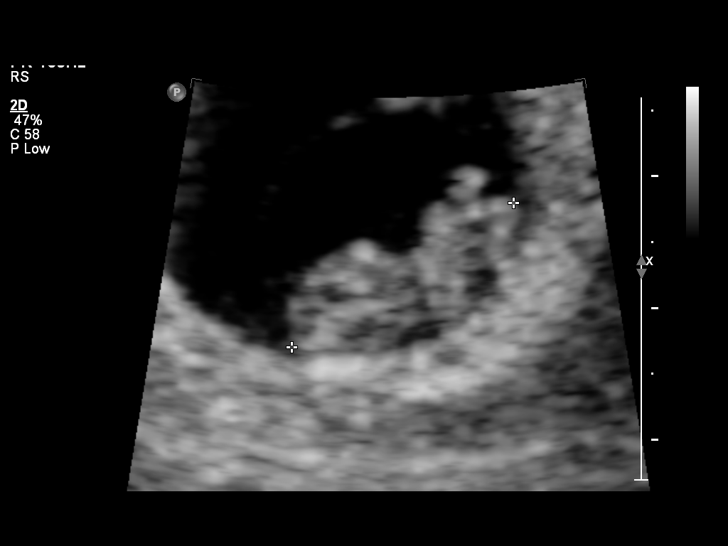
[im 13/21]
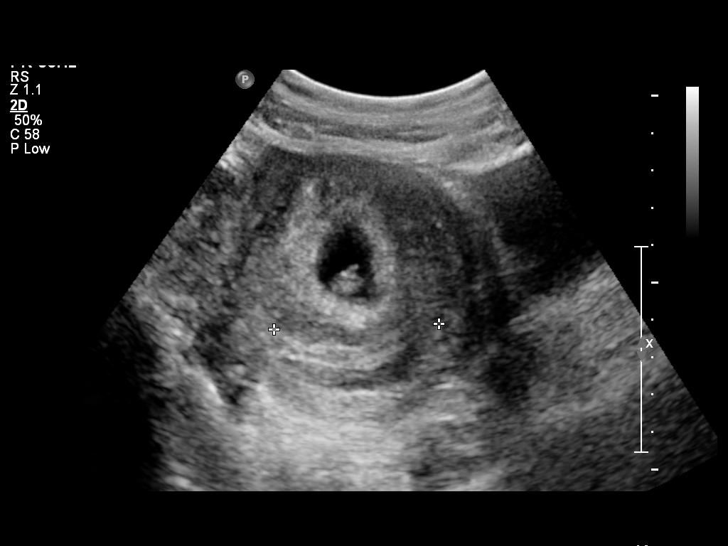
[im 15/21]
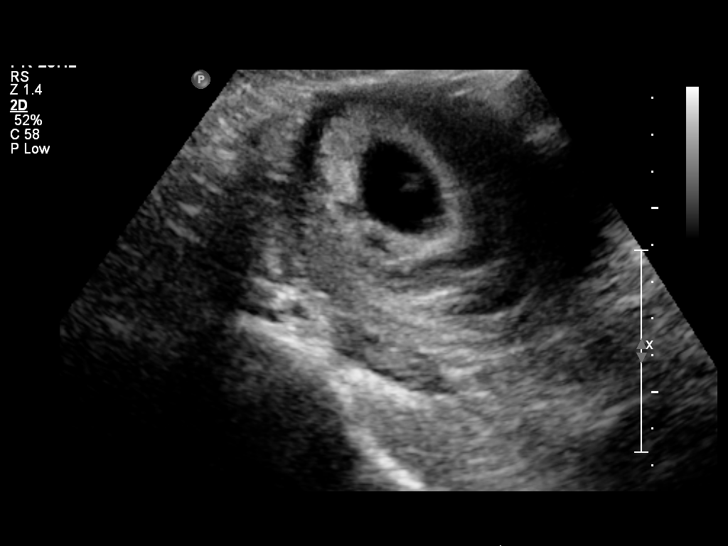
[im 16/21]
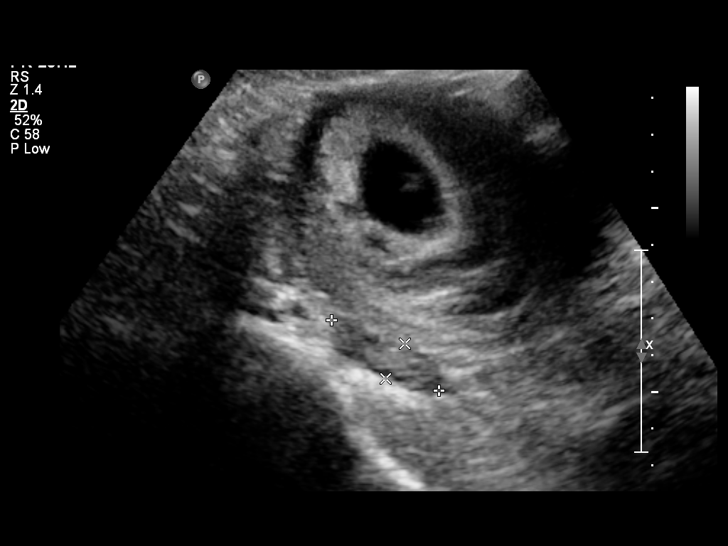
[im 18/21]
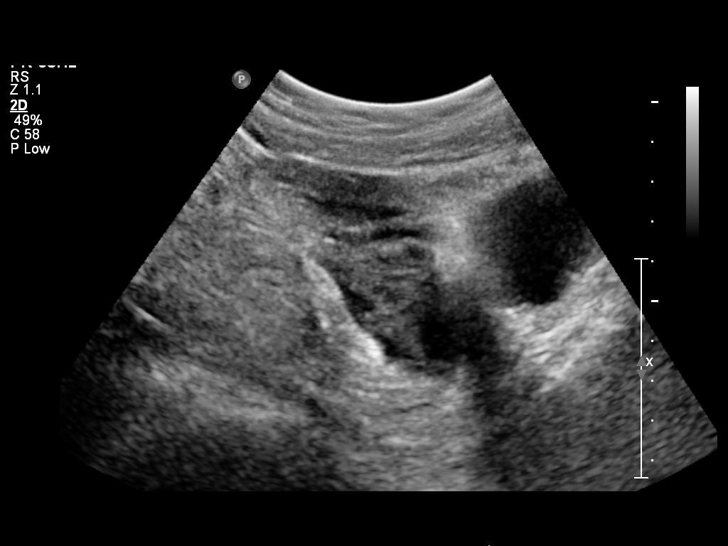
[im 19/21]
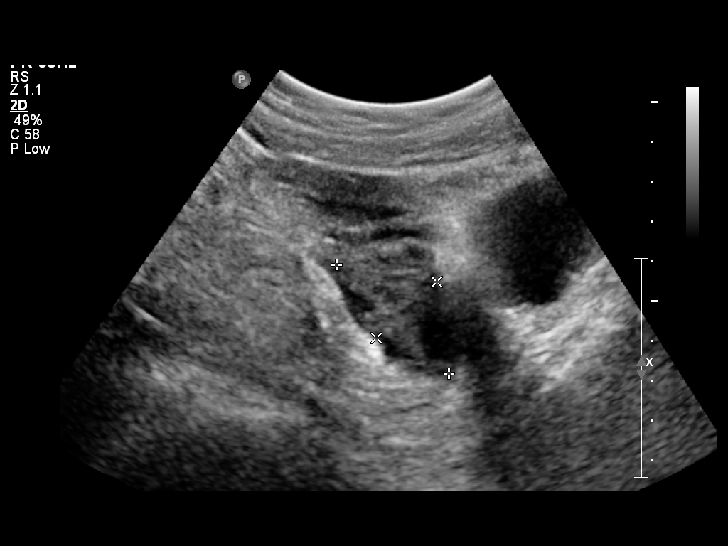
[im 21/21]
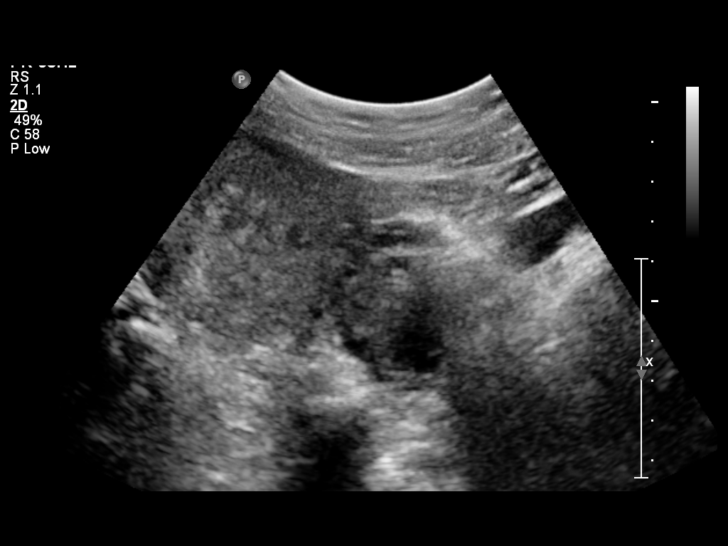

[14 of 21 positions shown; findings below may reference images not displayed]

Intrauterine gestational sac: Visualized/normal in shape.
Yolk sac: Present
Embryo: Present
Cardiac Activity: Present
Heart Rate: 176 bpm

CRL:  20 mm   8 w  5 d        US EDC: [DATE]

Maternal uterus/Adnexae:
Large subchorionic hemorrhage.
Right ovary normal size and morphology, 3.5 x 1.1 x 3.5 cm.
Left ovary normal size and morphology, 3.9 x 2.1 x 2.5 cm.
No adnexal masses or free pelvic fluid.
IMPRESSION: Single live early intrauterine gestation measured at 8 weeks 5 days
EGA by crown-rump length.
Large subchorionic hemorrhage.

## 2012-11-13 NOTE — MAU Note (Signed)
Pt reports she started having abd cramping and heavy vaginal bleeding with clots  That started 1 hr ago

## 2012-11-13 NOTE — MAU Provider Note (Signed)
Chief Complaint: Vaginal Bleeding   First Provider Initiated Contact with Patient 11/13/12 1801     SUBJECTIVE HPI: Michele Avila is a 21 y.o. G4P1021 at [redacted]w[redacted]d by LMP who presents with vaginal bleeding and abdominal cramping. The bleeding and cramping started today around 1530. The patient states that she has had heavy bleeding with clots. She also has lower abdominal cramping and low back pain which she rates at a 8/10 now. The patient has not had prenatal care. She was seen in MAU previously for N/V and sent home with phenergan. She is unsure of her LMP and states that she has been told she is not as far along as she thought she was at a previous visit. She denies fever, N/V today or other abnormal discharge.   Past Medical History  Diagnosis Date  . Seasonal allergies   . Environmental allergies   . Anemia    OB History    Grav Para Term Preterm Abortions TAB SAB Ect Mult Living   4 1 1  2 2    1      # Outc Date GA Lbr Len/2nd Wgt Sex Del Anes PTL Lv   1 TRM 12/08    M LTCS      2 TAB 12/12           3 TAB 8/13           4 CUR              Past Surgical History  Procedure Date  . Cesarean section 2008  . Dilation and curettage of uterus 08/2011  . Induced abortion    History   Social History  . Marital Status: Single    Spouse Name: N/A    Number of Children: N/A  . Years of Education: N/A   Occupational History  . Not on file.   Social History Main Topics  . Smoking status: Former Smoker -- 0.5 packs/day    Types: Cigarettes    Quit date: 08/31/2012  . Smokeless tobacco: Never Used  . Alcohol Use: No  . Drug Use: Yes    Special: Marijuana     Comment: last used 2 days ago, "I'm trying to quit, but I'm around it a lot."  . Sexually Active: Yes   Other Topics Concern  . Not on file   Social History Narrative  . No narrative on file   No current facility-administered medications on file prior to encounter.   Current Outpatient Prescriptions on File Prior  to Encounter  Medication Sig Dispense Refill  . promethazine (PHENERGAN) 12.5 MG tablet Take 1 tablet (12.5 mg total) by mouth every 6 (six) hours as needed for nausea.  30 tablet  0   No Known Allergies  ROS: Pertinent items in HPI  OBJECTIVE Blood pressure 129/87, pulse 114, temperature 97 F (36.1 C), temperature source Oral, resp. rate 18, height 5\' 7"  (1.702 m), weight 168 lb 6.4 oz (76.386 kg), last menstrual period 08/01/2012. GENERAL: Well-developed, well-nourished female in no acute distress.  HEENT: Normocephalic HEART: normal rate and rhythm RESP: normal effort ABDOMEN: Soft, tender to palpation of the lower abdomen at midline. EXTREMITIES: Nontender, no edema NEURO: Alert and oriented SPECULUM EXAM: Moderate blood noted pooling in the vagina on exam. Blood at the cervical os.    BIMANUAL: cervix closed; uterus smaller than stated gestational age and moderately tender to palpation, Mild right adnexal tenderness; no masses  LAB RESULTS Results for orders placed during the hospital encounter  of 11/13/12 (from the past 24 hour(s))  WET PREP, GENITAL     Status: Abnormal   Collection Time   11/13/12  6:13 PM      Component Value Range   Yeast Wet Prep HPF POC NONE SEEN  NONE SEEN   Trich, Wet Prep NONE SEEN  NONE SEEN   Clue Cells Wet Prep HPF POC NONE SEEN  NONE SEEN   WBC, Wet Prep HPF POC FEW (*) NONE SEEN  CBC     Status: Normal   Collection Time   11/13/12  6:34 PM      Component Value Range   WBC 8.7  4.0 - 10.5 K/uL   RBC 3.98  3.87 - 5.11 MIL/uL   Hemoglobin 12.0  12.0 - 15.0 g/dL   HCT 16.1  09.6 - 04.5 %   MCV 90.5  78.0 - 100.0 fL   MCH 30.2  26.0 - 34.0 pg   MCHC 33.3  30.0 - 36.0 g/dL   RDW 40.9  81.1 - 91.4 %   Platelets 186  150 - 400 K/uL  ABO/RH     Status: Normal (Preliminary result)   Collection Time   11/13/12  6:34 PM      Component Value Range   ABO/RH(D) O NEG    HCG, QUANTITATIVE, PREGNANCY     Status: Abnormal   Collection Time    11/13/12  6:34 PM      Component Value Range   hCG, Beta Chain, Quant, S 72042 (*) <5 mIU/mL    IMAGING *RADIOLOGY REPORT*  Clinical Data: Pregnant, bleeding, cramping  OBSTETRIC <14 WK ULTRASOUND   Technique: Transabdominal ultrasound was performed for evaluation  of the gestation as well as the maternal uterus and adnexal  regions.   Comparison: None for this pregnancy  Intrauterine gestational sac: Visualized/normal in shape.  Yolk sac: Present  Embryo: Present  Cardiac Activity: Present  Heart Rate: 176 bpm  CRL: 20 mm 8 w 5 d [name] EDC: 06/20/2013   Maternal uterus/Adnexae:  Large subchorionic hemorrhage.  Right ovary normal size and morphology, 3.5 x 1.1 x 3.5 cm.  Left ovary normal size and morphology, 3.9 x 2.1 x 2.5 cm.  No adnexal masses or free pelvic fluid.   IMPRESSION:  Single live early intrauterine gestation measured at 8 weeks 5 days  EGA by crown-rump length.  Large subchorionic hemorrhage.   Original Report Authenticated By: Ulyses Southward, M.D.   MAU COURSE Unable to obtain fetal cardiac activity on bedside US  ASSESSMENT 1. Subchorionic hemorrhage     PLAN Discharge home Bleeding/miscarriage precautions discussed Patient encouraged to establish prenatal care as soon as possible Patient instructions include subchorionic hematoma, pelvic rest and vaginal bleeding in pregnancy Patient cautioned to return to MAU if her condition were to change or worsen       Follow-up Information    Follow up with [facility]. (As needed if symptoms worsen)    Contact information:   [address] [place] [address] [phone]          Medication List     As of 11/13/2012  7:37 PM    TAKE these medications         prenatal multivitamin Tabs   Take 1 tablet by mouth daily.      promethazine 12.5 MG tablet   Commonly known as: PHENERGAN   Take 1 tablet (12.5 mg total) by mouth every 6  (six)  hours as needed for nausea.           Freddi Starr, PA-C 11/13/2012  7:37 PM

## 2012-11-14 LAB — GC/CHLAMYDIA PROBE AMP: GC Probe RNA: NEGATIVE

## 2012-11-14 LAB — ABO/RH: ABO/RH(D): O NEG

## 2013-01-13 ENCOUNTER — Encounter (HOSPITAL_COMMUNITY): Payer: Self-pay | Admitting: *Deleted

## 2013-01-13 ENCOUNTER — Inpatient Hospital Stay (HOSPITAL_COMMUNITY)
Admission: AD | Admit: 2013-01-13 | Discharge: 2013-01-13 | Disposition: A | Discharge status: Home / Self Care | Payer: Self-pay | Source: Ambulatory Visit | Attending: Obstetrics and Gynecology | Admitting: Obstetrics and Gynecology

## 2013-01-13 DIAGNOSIS — N949 Unspecified condition associated with female genital organs and menstrual cycle: Secondary | ICD-10-CM

## 2013-01-13 DIAGNOSIS — R102 Pelvic and perineal pain: Secondary | ICD-10-CM

## 2013-01-13 DIAGNOSIS — R109 Unspecified abdominal pain: Secondary | ICD-10-CM | POA: Insufficient documentation

## 2013-01-13 DIAGNOSIS — O34219 Maternal care for unspecified type scar from previous cesarean delivery: Secondary | ICD-10-CM | POA: Insufficient documentation

## 2013-01-13 DIAGNOSIS — O26899 Other specified pregnancy related conditions, unspecified trimester: Secondary | ICD-10-CM

## 2013-01-13 DIAGNOSIS — O99891 Other specified diseases and conditions complicating pregnancy: Secondary | ICD-10-CM | POA: Insufficient documentation

## 2013-01-13 DIAGNOSIS — O9989 Other specified diseases and conditions complicating pregnancy, childbirth and the puerperium: Secondary | ICD-10-CM

## 2013-01-13 LAB — URINALYSIS, ROUTINE W REFLEX MICROSCOPIC
Bilirubin Urine: NEGATIVE
Hgb urine dipstick: NEGATIVE
Ketones, ur: 15 mg/dL — AB
Nitrite: NEGATIVE
Protein, ur: NEGATIVE mg/dL
Urobilinogen, UA: 0.2 mg/dL (ref 0.0–1.0)

## 2013-01-13 LAB — WET PREP, GENITAL: Trich, Wet Prep: NONE SEEN

## 2013-01-13 MED ORDER — ONDANSETRON 8 MG PO TBDP
8.0000 mg | ORAL_TABLET | Freq: Once | ORAL | Status: AC
  Administered 2013-01-13: 8 mg via ORAL
  Filled 2013-01-13: qty 1

## 2013-01-13 MED ORDER — OXYCODONE-ACETAMINOPHEN 5-325 MG PO TABS
1.0000 | ORAL_TABLET | Freq: Once | ORAL | Status: AC
  Administered 2013-01-13: 1 via ORAL
  Filled 2013-01-13: qty 1

## 2013-01-13 NOTE — MAU Note (Signed)
Pt has been having abd cramping x 1 week and has had a visus with V/D x 4 days ago.  Last vomiting yesterday and diarrhea was this am.  Pt was dx with subchorionic hem by ultrasound last visit here per pt.  Pt has had a H/A x 2 weeks.  Denies vaginal bleeding or ROM.

## 2013-01-13 NOTE — MAU Provider Note (Signed)
History     CSN: [id number]  Arrival date and time: 01/13/13 1843   None     Chief Complaint  Patient presents with  . Abdominal Cramping   HPI Michele Avila is 22 y.o. [id number] [redacted]w[redacted]d weeks presenting with lower lateral wall pain.  Hx of C-Section for large baby 2008.  Feels the pain there as well.   Recent viral illness with a little diarrhea, hot and cold.  Denies vaginal bleeding or discharge.  Has a headache.  Denies history of  Migraines.   Took 1 tylenol this am eases it for about an hour.  She is very concerned because she has not started prenatal care.  Hx of large baby, previous C-Section and late care with that pregnancy.  Tried to get in for care today without success because her medicaid is pending.    Past Medical History  Diagnosis Date  . Seasonal allergies   . Environmental allergies   . Anemia     Past Surgical History  Procedure Laterality Date  . Cesarean section  2008  . Dilation and curettage of uterus  08/2011  . Induced abortion      Family History  Problem Relation Age of Onset  . Hypertension Mother   . Cancer Father     liver  . Diabetes Father   . Hypertension Father   . Hypertension Maternal Grandmother   . Kidney disease Maternal Grandmother   . Diabetes Paternal Grandmother   . Hypertension Paternal Grandmother     History  Substance Use Topics  . Smoking status: Current Some Day Smoker -- 0.25 packs/day    Types: Cigarettes  . Smokeless tobacco: Never Used  . Alcohol Use: No     Comment: occassionally     Allergies:  Allergies  Allergen Reactions  . Latex     Rash at site    Prescriptions prior to admission  Medication Sig Dispense Refill  . acetaminophen (TYLENOL) 500 MG tablet Take 500 mg by mouth every 6 (six) hours as needed for pain.      . Prenatal Vit-Fe Fumarate-FA (PRENATAL MULTIVITAMIN) TABS Take 1 tablet by mouth daily.      . promethazine (PHENERGAN) 12.5 MG tablet Take 1 tablet (12.5 mg total) by mouth every 6  (six) hours as needed for nausea.  30 tablet  0    Review of Systems  Constitutional: Negative.   Respiratory: Negative.   Cardiovascular: Negative.   Gastrointestinal: Positive for abdominal pain (over previous C-Sect scar, and lateral/side wall pain).  Genitourinary:       Neg for vaginal bleeding or discharge   Physical Exam   Blood pressure 126/84, pulse 90, temperature 98.4 F (36.9 C), temperature source Oral, resp. rate 16, height 5\' 7"  (1.702 m), weight 168 lb (76.204 kg), last menstrual period 08/01/2012, SpO2 100.00%.  Physical Exam  Constitutional: She is oriented to person, place, and time. She appears well-developed and well-nourished. No distress.  HENT:  Head: Normocephalic.  Neck: Normal range of motion.  Cardiovascular: Normal rate.   Respiratory: Effort normal.  GI: Soft. She exhibits no distension and no mass. There is tenderness (mild tenderness over the c-section incision). There is no rebound and no guarding.  Tenderness over the round ligaments bilaterally  Genitourinary: There is no rash, tenderness or lesion on the right labia. There is no rash, tenderness or lesion on the left labia. Uterus is enlarged (measures 17 week size, nontender). Cervix exhibits no motion tenderness and no discharge. No  bleeding around the vagina. No vaginal discharge found.  Neurological: She is alert and oriented to person, place, and time.  Skin: Skin is warm and dry.  Psychiatric: She has a normal mood and affect. Her behavior is normal.   FHR 150  Results for orders placed during the hospital encounter of 01/13/13 (from the past 24 hour(s))  URINALYSIS, ROUTINE W REFLEX MICROSCOPIC     Status: Abnormal   Collection Time    01/13/13  7:16 PM      Result Value Range   Color, Urine YELLOW  YELLOW   APPearance CLEAR  CLEAR   Specific Gravity, Urine >1.030 (*) 1.005 - 1.030   pH 6.0  5.0 - 8.0   Glucose, UA NEGATIVE  NEGATIVE mg/dL   Hgb urine dipstick NEGATIVE  NEGATIVE    Bilirubin Urine NEGATIVE  NEGATIVE   Ketones, ur 15 (*) NEGATIVE mg/dL   Protein, ur NEGATIVE  NEGATIVE mg/dL   Urobilinogen, UA 0.2  0.0 - 1.0 mg/dL   Nitrite NEGATIVE  NEGATIVE   Leukocytes, UA NEGATIVE  NEGATIVE  WET PREP, GENITAL     Status: Abnormal   Collection Time    01/13/13  9:29 PM      Result Value Range   Yeast Wet Prep HPF POC NONE SEEN  NONE SEEN   Trich, Wet Prep NONE SEEN  NONE SEEN   Clue Cells Wet Prep HPF POC NONE SEEN  NONE SEEN   WBC, Wet Prep HPF POC MODERATE (*) NONE SEEN   MAU Course  Procedures  GC/CHL culture to lab  MDM  ZOfran 8mg  ODT and Percocet 1 tab po given for headache.  She admits to not being comfortable taking meds for her headache.  Medication helped relieve her headache sxs.    Assessment and Plan  A:  109w3d gestation       Round ligament pain  P:  Tylenol prn discomfort       Referral to [facility] to begin prenatal care       Jovoni Borkenhagen,EVE M 01/13/2013, 9:58 PM

## 2013-01-14 NOTE — MAU Provider Note (Signed)
Attestation of Attending Supervision of Advanced Practitioner: Evaluation and management procedures were performed by the PA/NP/CNM/OB Fellow under my supervision/collaboration. Chart reviewed and agree with management and plan.  Vihan Santagata V 01/14/2013 5:50 AM

## 2013-01-18 LAB — GC/CHLAMYDIA PROBE AMP
CT Probe RNA: NEGATIVE
GC Probe RNA: NEGATIVE

## 2013-02-11 ENCOUNTER — Inpatient Hospital Stay (HOSPITAL_COMMUNITY): Payer: Medicaid Other

## 2013-02-11 ENCOUNTER — Inpatient Hospital Stay (HOSPITAL_COMMUNITY)
Admission: AD | Admit: 2013-02-11 | Discharge: 2013-02-11 | Disposition: A | Discharge status: Home / Self Care | Payer: Medicaid Other | Source: Ambulatory Visit | Attending: Obstetrics and Gynecology | Admitting: Obstetrics and Gynecology

## 2013-02-11 ENCOUNTER — Encounter (HOSPITAL_COMMUNITY): Payer: Self-pay | Admitting: Obstetrics and Gynecology

## 2013-02-11 DIAGNOSIS — N898 Other specified noninflammatory disorders of vagina: Secondary | ICD-10-CM

## 2013-02-11 DIAGNOSIS — R109 Unspecified abdominal pain: Secondary | ICD-10-CM | POA: Insufficient documentation

## 2013-02-11 DIAGNOSIS — O99891 Other specified diseases and conditions complicating pregnancy: Secondary | ICD-10-CM | POA: Insufficient documentation

## 2013-02-11 DIAGNOSIS — O26899 Other specified pregnancy related conditions, unspecified trimester: Secondary | ICD-10-CM

## 2013-02-11 DIAGNOSIS — N949 Unspecified condition associated with female genital organs and menstrual cycle: Secondary | ICD-10-CM | POA: Insufficient documentation

## 2013-02-11 LAB — WET PREP, GENITAL
Clue Cells Wet Prep HPF POC: NONE SEEN
Yeast Wet Prep HPF POC: NONE SEEN

## 2013-02-11 NOTE — MAU Note (Signed)
Pt is here today with complaints of increased vaginal discharge. She noticed a large gush yesterday of clear, mucousy discharge. She has not had to wear a pad with the discharge, its just more than normal.

## 2013-02-11 NOTE — MAU Provider Note (Signed)
History     CSN: [id number]  Arrival date and time: 02/11/13 1149   None     Chief Complaint  Patient presents with  . Vaginal Discharge   HPI Michele Avila is 22 y.o. [phone] [redacted]w[redacted]d weeks presenting with noticed a large amount of mucous when she wiped yesterday.  Since that time she has notices clear leaking of fluid that soaked her pants.  Does not have an odor.  Denies vaginal bleeding.  Reports crampy feeling for 4 days in her lower back.  Felt fetal movement last night.     Past Medical History  Diagnosis Date  . Seasonal allergies   . Environmental allergies   . Anemia     Past Surgical History  Procedure Laterality Date  . Cesarean section  2008  . Dilation and curettage of uterus  08/2011  . Induced abortion      Family History  Problem Relation Age of Onset  . Hypertension Mother   . Cancer Father     liver  . Diabetes Father   . Hypertension Father   . Hypertension Maternal Grandmother   . Kidney disease Maternal Grandmother   . Diabetes Paternal Grandmother   . Hypertension Paternal Grandmother     History  Substance Use Topics  . Smoking status: Current Some Day Smoker -- 0.25 packs/day    Types: Cigarettes  . Smokeless tobacco: Never Used  . Alcohol Use: No     Comment: occassionally     Allergies:  Allergies  Allergen Reactions  . Latex Hives and Rash    Prescriptions prior to admission  Medication Sig Dispense Refill  . acetaminophen (TYLENOL) 500 MG tablet Take 500 mg by mouth daily as needed for pain.       . Prenatal Vit-Fe Fumarate-FA (PRENATAL MULTIVITAMIN) TABS Take 1 tablet by mouth daily.      . promethazine (PHENERGAN) 12.5 MG tablet Take 1 tablet (12.5 mg total) by mouth every 6 (six) hours as needed for nausea.  30 tablet  0    Review of Systems  Constitutional: Positive for fever. Negative for chills.  Gastrointestinal: Positive for vomiting. Negative for nausea (X1 today) and abdominal pain.  Genitourinary: Negative for  dysuria and frequency.       Neg for vaginal bleeding  Musculoskeletal: Positive for back pain (lower back ).   Physical Exam   Blood pressure 127/83, pulse 86, temperature 98.1 F (36.7 C), temperature source Oral, resp. rate 18, last menstrual period 08/01/2012.  Physical Exam  Constitutional: She is oriented to person, place, and time. She appears well-developed and well-nourished. No distress.  HENT:  Head: Normocephalic.  Neck: Normal range of motion.  Cardiovascular: Normal rate.   Respiratory: Effort normal.  GI: Soft. She exhibits no distension and no mass. There is no tenderness. There is no rebound and no guarding.  Genitourinary: There is no rash, tenderness or lesion on the right labia. There is no rash, tenderness or lesion on the left labia. Uterus is enlarged. Uterus is not tender. Cervix exhibits no motion tenderness, no discharge and no friability. No bleeding around the vagina. Vaginal discharge (small amount of white mucous without odor,  No pooling of lfuid) found.  Cervix is long, closed, nontender  Neurological: She is alert and oriented to person, place, and time.  Skin: Skin is warm and dry.  Psychiatric: She has a normal mood and affect. Her behavior is normal.   Results for orders placed during the hospital encounter of 02/11/13 (  from the past 24 hour(s))  WET PREP, GENITAL     Status: Abnormal   Collection Time    02/11/13  1:02 PM      Result Value Range   Yeast Wet Prep HPF POC NONE SEEN  NONE SEEN   Trich, Wet Prep NONE SEEN  NONE SEEN   Clue Cells Wet Prep HPF POC NONE SEEN  NONE SEEN   WBC, Wet Prep HPF POC FEW (*) NONE SEEN  POCT FERN TEST     Status: None   Collection Time    02/11/13  1:16 PM      Result Value Range   POCT Fern Test Negative = intact amniotic membranes      MAU Course  Procedures  MDM 13:18  Reported MSE to Dr. Henderson Cloud.  Order given for ultrasound for fluid level and cervical length.   Patient now ready for ultrasound.   Care turned over to D.Stewart Pimenta at 14:00 Assessment and Plan    KEY,EVE M 02/11/2013, 12:24 PM   *RADIOLOGY REPORT*  Clinical Data: Evaluate amniotic fluid and cervical length,  question premature rupture of membranes  ULTRAOUND OB LIMITED - NRPT [facility]  Technique: Limited OB ultrasound  Comparison: 11/13/2012  Findings: There is a single live intrauterine gestation in cephalic  presentation. Anterior placenta is noted. No placenta previa.  Fetal heart rate is 150 bpm. Normal amniotic fluid is noted.  Largest vertical pocket measures 4.5 cm.  The cervix is closed. Cervical length measures 3.9 cm.  IMPRESSION:  Single live intrauterine gestation. Fetal heart150 bpm. Cephalic  presentation. Normal amniotic fluid with the largest vertical  pocket measuring 4.5 cm. The cervix is closed measuring 3.9 cm in  length.  Original Report Authenticated By: Natasha Mead, M.D.   1515: pt reassured and Dr. Henderson Cloud notified of [name] report.

## 2013-03-20 LAB — OB RESULTS CONSOLE RUBELLA ANTIBODY, IGM: Rubella: IMMUNE

## 2013-03-20 LAB — OB RESULTS CONSOLE HEPATITIS B SURFACE ANTIGEN: Hepatitis B Surface Ag: NEGATIVE

## 2013-05-07 ENCOUNTER — Inpatient Hospital Stay (HOSPITAL_COMMUNITY)
Admission: AD | Admit: 2013-05-07 | Discharge: 2013-05-07 | Disposition: A | Discharge status: Home / Self Care | Payer: Medicaid Other | Source: Ambulatory Visit | Attending: Obstetrics & Gynecology | Admitting: Obstetrics & Gynecology

## 2013-05-07 ENCOUNTER — Encounter (HOSPITAL_COMMUNITY): Payer: Self-pay | Admitting: *Deleted

## 2013-05-07 DIAGNOSIS — N390 Urinary tract infection, site not specified: Secondary | ICD-10-CM | POA: Insufficient documentation

## 2013-05-07 DIAGNOSIS — O2343 Unspecified infection of urinary tract in pregnancy, third trimester: Secondary | ICD-10-CM

## 2013-05-07 DIAGNOSIS — O4700 False labor before 37 completed weeks of gestation, unspecified trimester: Secondary | ICD-10-CM | POA: Insufficient documentation

## 2013-05-07 DIAGNOSIS — O2390 Unspecified genitourinary tract infection in pregnancy, unspecified trimester: Secondary | ICD-10-CM | POA: Insufficient documentation

## 2013-05-07 LAB — URINALYSIS, ROUTINE W REFLEX MICROSCOPIC
Bilirubin Urine: NEGATIVE
Glucose, UA: NEGATIVE mg/dL
Hgb urine dipstick: NEGATIVE
Protein, ur: 30 mg/dL — AB
Specific Gravity, Urine: 1.02 (ref 1.005–1.030)
Urobilinogen, UA: 1 mg/dL (ref 0.0–1.0)

## 2013-05-07 LAB — URINE MICROSCOPIC-ADD ON

## 2013-05-07 MED ORDER — NITROFURANTOIN MONOHYD MACRO 100 MG PO CAPS: 100.0000 mg | ORAL_CAPSULE | Freq: Two times a day (BID) | ORAL | Status: DC

## 2013-05-07 NOTE — MAU Note (Signed)
Pt states she has constant lower back pain and has pains that come and go in her abd

## 2013-05-07 NOTE — MAU Provider Note (Signed)
History     CSN: [id number]  Arrival date and time: 05/07/13 4098   First Provider Initiated Contact with Patient 05/07/13 [phone]      Chief Complaint  Patient presents with  . Contractions   HPI  Pt is a [id number] here at 33.5 wks IUP with report of intermittent cramping that started this AM.  No report of abnormal vaginal discharge, vaginal bleeding, leaking of fluid, or recent intercourse.  Denies problems with current pregnancy.    Past Medical History  Diagnosis Date  . Seasonal allergies   . Environmental allergies   . Anemia     Past Surgical History  Procedure Laterality Date  . Cesarean section  2008  . Dilation and curettage of uterus  08/2011  . Induced abortion      Family History  Problem Relation Age of Onset  . Hypertension Mother   . Cancer Father     liver  . Diabetes Father   . Hypertension Father   . Hypertension Maternal Grandmother   . Kidney disease Maternal Grandmother   . Diabetes Paternal Grandmother   . Hypertension Paternal Grandmother     History  Substance Use Topics  . Smoking status: Current Some Day Smoker -- 0.25 packs/day    Types: Cigarettes  . Smokeless tobacco: Never Used  . Alcohol Use: No     Comment: occassionally     Allergies:  Allergies  Allergen Reactions  . Latex Hives and Rash    Prescriptions prior to admission  Medication Sig Dispense Refill  . acetaminophen (TYLENOL) 500 MG tablet Take 500 mg by mouth daily as needed for pain.       . Prenatal Vit-Fe Fumarate-FA (PRENATAL MULTIVITAMIN) TABS Take 1 tablet by mouth daily.      . promethazine (PHENERGAN) 12.5 MG tablet Take 1 tablet (12.5 mg total) by mouth every 6 (six) hours as needed for nausea.  30 tablet  0    Review of Systems  Gastrointestinal: Positive for abdominal pain (cramping). Negative for nausea, vomiting and diarrhea.  Genitourinary: Negative.   All other systems reviewed and are negative.   Physical Exam   Blood pressure 111/68, pulse  96, temperature 98 F (36.7 C), temperature source Oral, resp. rate 16, height 5\' 7"  (1.702 m), weight 91.343 kg (201 lb 6 oz), last menstrual period 08/01/2012, SpO2 100.00%.  Physical Exam  Constitutional: She is oriented to person, place, and time. She appears well-developed and well-nourished. No distress.  HENT:  Head: Normocephalic.  Neck: Normal range of motion. Neck supple.  Cardiovascular: Normal rate, regular rhythm and normal heart sounds.   Respiratory: Effort normal and breath sounds normal.  GI: Soft. There is no tenderness.  Genitourinary: No bleeding around the vagina. Vaginal discharge (mucusy) found.  Neurological: She is alert and oriented to person, place, and time.  Skin: Skin is warm and dry.   Dilation: Closed Exam by:: Muhummad CNM  MAU Course  Procedures  Results for orders placed during the hospital encounter of 05/07/13 (from the past 24 hour(s))  URINALYSIS, ROUTINE W REFLEX MICROSCOPIC     Status: Abnormal   Collection Time    05/07/13  7:00 AM      Result Value Range   Color, Urine YELLOW  YELLOW   APPearance CLEAR  CLEAR   Specific Gravity, Urine 1.020  1.005 - 1.030   pH 7.0  5.0 - 8.0   Glucose, UA NEGATIVE  NEGATIVE mg/dL   Hgb urine dipstick NEGATIVE  NEGATIVE  Bilirubin Urine NEGATIVE  NEGATIVE   Ketones, ur NEGATIVE  NEGATIVE mg/dL   Protein, ur 30 (*) NEGATIVE mg/dL   Urobilinogen, UA 1.0  0.0 - 1.0 mg/dL   Nitrite NEGATIVE  NEGATIVE   Leukocytes, UA MODERATE (*) NEGATIVE  URINE MICROSCOPIC-ADD ON     Status: Abnormal   Collection Time    05/07/13  7:00 AM      Result Value Range   Squamous Epithelial / LPF MANY (*) RARE   WBC, UA 21-50  <3 WBC/hpf   RBC / HPF 0-2  <3 RBC/hpf   Bacteria, UA MANY (*) RARE   FHR 120's, +accels Toco - irregular   Assessment and Plan  Urinary Tract Infection Category I FHR Tracing  Plan: DC to home RX Macrobid Urine culture Follow-up prn  [facility] 05/07/2013, 7:28 AM

## 2013-05-08 LAB — URINE CULTURE

## 2013-05-22 ENCOUNTER — Other Ambulatory Visit: Payer: Self-pay | Admitting: Obstetrics and Gynecology

## 2013-05-22 LAB — OB RESULTS CONSOLE GBS: GBS: NEGATIVE

## 2013-06-05 ENCOUNTER — Inpatient Hospital Stay (HOSPITAL_COMMUNITY)
Admission: AD | Admit: 2013-06-05 | Discharge: 2013-06-05 | Disposition: A | Discharge status: Home / Self Care | Payer: Medicaid Other | Source: Ambulatory Visit | Attending: Obstetrics & Gynecology | Admitting: Obstetrics & Gynecology

## 2013-06-05 ENCOUNTER — Encounter (HOSPITAL_COMMUNITY): Payer: Self-pay | Admitting: *Deleted

## 2013-06-05 DIAGNOSIS — O479 False labor, unspecified: Secondary | ICD-10-CM | POA: Insufficient documentation

## 2013-06-05 HISTORY — DX: Unspecified infectious disease: B99.9

## 2013-06-05 HISTORY — DX: Fracture of unspecified carpal bone, right wrist, initial encounter for closed fracture: S62.101A

## 2013-06-05 NOTE — MAU Note (Signed)
Contractions woke her at 0530.  No leaking or bleeding.

## 2013-06-08 ENCOUNTER — Encounter (HOSPITAL_COMMUNITY): Payer: Self-pay | Admitting: Pharmacy Technician

## 2013-06-14 ENCOUNTER — Encounter (HOSPITAL_COMMUNITY)
Admission: RE | Admit: 2013-06-14 | Discharge: 2013-06-14 | Disposition: A | Discharge status: Home / Self Care | Payer: Medicaid Other | Source: Ambulatory Visit | Attending: Obstetrics & Gynecology | Admitting: Obstetrics & Gynecology

## 2013-06-14 ENCOUNTER — Encounter (HOSPITAL_COMMUNITY): Payer: Self-pay

## 2013-06-14 HISTORY — DX: Unspecified asthma, uncomplicated: J45.909

## 2013-06-14 LAB — CBC
MCV: 87.7 fL (ref 78.0–100.0)
Platelets: 226 10*3/uL (ref 150–400)
RBC: 4.06 MIL/uL (ref 3.87–5.11)
WBC: 8.3 10*3/uL (ref 4.0–10.5)

## 2013-06-14 LAB — TYPE AND SCREEN: ABO/RH(D): O NEG

## 2013-06-14 LAB — RPR: RPR Ser Ql: NONREACTIVE

## 2013-06-14 NOTE — H&P (Signed)
22 y.o. [id number]  Estimated Date of Delivery: 06/18/13 admitted at 39/[redacted] weeks gestation for repeat C/S.  Prenatal Transfer Tool  Maternal Diabetes: No Genetic Screening: Normal Maternal Ultrasounds/Referrals: Normal Fetal Ultrasounds or other Referrals:  None Maternal Substance Abuse:  No Significant Maternal Medications:  None Significant Maternal Lab Results: None Other Significant Pregnancy Complications:  Previous cesarean delivery.  Afebrile, VSS Heart and Lungs: No active disease Abdomen: soft, gravid, EFW LGA (>90%ile). Cervical exam:  2/50  Impression: Previous cesarean delivery.  Patient desires repeat.  Plan:  Repeat C/S.

## 2013-06-14 NOTE — Patient Instructions (Addendum)
21 Joany Khatib  06/14/2013   Your procedure is scheduled on:  06/16/13  Enter through the Main Entrance of [facility] at 11 AM.  Pick up the phone at the desk and dial [phone].   Call this number if you have problems the morning of surgery: [phone]   Remember:   Do not eat food:After Midnight.  Do not drink clear liquids: 4 Hours before arrival.  Take these medicines the morning of surgery with A SIP OF WATER: NA    Do not wear jewelry, make-up or nail polish.  Do not wear lotions, powders, or perfumes. You may wear deodorant.  Do not shave 48 hours prior to surgery.  Do not bring valuables to the hospital.  [facility] is not responsible                  for any belongings or valuables brought to the hospital.  Contacts, dentures or bridgework may not be worn into surgery.  Leave suitcase in the car. After surgery it may be brought to your room.  For patients admitted to the hospital, checkout time is 11:00 AM the day of                discharge.   Patients discharged the day of surgery will not be allowed to drive                   home.  Name and phone number of your driver: NA  Special Instructions: Shower using CHG 2 nights before surgery and the night before surgery.  If you shower the day of surgery use CHG.  Use special wash - you have one bottle of CHG for all showers.  You should use approximately 1/3 of the bottle for each shower.   Please read over the following fact sheets that you were given: Surgical Site Infection Prevention

## 2013-06-15 ENCOUNTER — Encounter (HOSPITAL_COMMUNITY): Payer: Self-pay | Admitting: Anesthesiology

## 2013-06-15 ENCOUNTER — Encounter (HOSPITAL_COMMUNITY): Payer: Self-pay | Admitting: *Deleted

## 2013-06-15 ENCOUNTER — Inpatient Hospital Stay (HOSPITAL_COMMUNITY)
Admission: AD | Admit: 2013-06-15 | Discharge: 2013-06-17 | DRG: 766 | Disposition: A | Discharge status: Home / Self Care | Payer: Medicaid Other | Source: Ambulatory Visit | Attending: Obstetrics and Gynecology | Admitting: Obstetrics and Gynecology

## 2013-06-15 ENCOUNTER — Inpatient Hospital Stay (HOSPITAL_COMMUNITY): Payer: Medicaid Other | Admitting: Anesthesiology

## 2013-06-15 ENCOUNTER — Encounter (HOSPITAL_COMMUNITY)
Admission: AD | Disposition: A | Discharge status: Home / Self Care | Payer: Self-pay | Source: Ambulatory Visit | Attending: Obstetrics and Gynecology

## 2013-06-15 DIAGNOSIS — O34219 Maternal care for unspecified type scar from previous cesarean delivery: Principal | ICD-10-CM | POA: Diagnosis present

## 2013-06-15 SURGERY — Surgical Case
Anesthesia: Spinal | Site: Abdomen | Wound class: Clean Contaminated

## 2013-06-15 MED ORDER — ONDANSETRON HCL 4 MG/2ML IJ SOLN
INTRAMUSCULAR | Status: DC | PRN
  Administered 2013-06-15: 4 mg via INTRAVENOUS

## 2013-06-15 MED ORDER — SCOPOLAMINE 1 MG/3DAYS TD PT72
1.0000 | MEDICATED_PATCH | TRANSDERMAL | Status: DC
  Administered 2013-06-15: 1.5 mg via TRANSDERMAL
  Filled 2013-06-15: qty 1

## 2013-06-15 MED ORDER — KETOROLAC TROMETHAMINE 30 MG/ML IJ SOLN: 30.0000 mg | Freq: Four times a day (QID) | INTRAMUSCULAR | Status: DC | PRN

## 2013-06-15 MED ORDER — PHENYLEPHRINE 40 MCG/ML (10ML) SYRINGE FOR IV PUSH (FOR BLOOD PRESSURE SUPPORT)
PREFILLED_SYRINGE | INTRAVENOUS | Status: AC
  Filled 2013-06-15: qty 5

## 2013-06-15 MED ORDER — LACTATED RINGERS IV SOLN
INTRAVENOUS | Status: DC
  Administered 2013-06-15: 23:00:00 via INTRAVENOUS

## 2013-06-15 MED ORDER — DIPHENHYDRAMINE HCL 50 MG/ML IJ SOLN: 25.0000 mg | INTRAMUSCULAR | Status: DC | PRN

## 2013-06-15 MED ORDER — ONDANSETRON HCL 4 MG PO TABS: 4.0000 mg | ORAL_TABLET | ORAL | Status: DC | PRN

## 2013-06-15 MED ORDER — MEPERIDINE HCL 25 MG/ML IJ SOLN
INTRAMUSCULAR | Status: AC
  Administered 2013-06-15: 12.5 mg via INTRAVENOUS
  Filled 2013-06-15: qty 1

## 2013-06-15 MED ORDER — KETOROLAC TROMETHAMINE 60 MG/2ML IM SOLN
INTRAMUSCULAR | Status: AC
  Administered 2013-06-15: 60 mg via INTRAMUSCULAR
  Filled 2013-06-15: qty 2

## 2013-06-15 MED ORDER — DIPHENHYDRAMINE HCL 50 MG/ML IJ SOLN: 12.5000 mg | INTRAMUSCULAR | Status: DC | PRN

## 2013-06-15 MED ORDER — NALOXONE HCL 1 MG/ML IJ SOLN: 1.0000 ug/kg/h | INTRAVENOUS | Status: DC | PRN

## 2013-06-15 MED ORDER — NALBUPHINE HCL 10 MG/ML IJ SOLN
5.0000 mg | INTRAMUSCULAR | Status: DC | PRN
  Filled 2013-06-15: qty 1

## 2013-06-15 MED ORDER — DIPHENHYDRAMINE HCL 25 MG PO CAPS: 25.0000 mg | ORAL_CAPSULE | ORAL | Status: DC | PRN

## 2013-06-15 MED ORDER — SODIUM CHLORIDE 0.9 % IJ SOLN: 3.0000 mL | INTRAMUSCULAR | Status: DC | PRN

## 2013-06-15 MED ORDER — METOCLOPRAMIDE HCL 5 MG/ML IJ SOLN: 10.0000 mg | Freq: Three times a day (TID) | INTRAMUSCULAR | Status: DC | PRN

## 2013-06-15 MED ORDER — NALOXONE HCL 0.4 MG/ML IJ SOLN: 0.4000 mg | INTRAMUSCULAR | Status: DC | PRN

## 2013-06-15 MED ORDER — SIMETHICONE 80 MG PO CHEW
80.0000 mg | CHEWABLE_TABLET | Freq: Three times a day (TID) | ORAL | Status: DC
  Administered 2013-06-15 – 2013-06-17 (×7): 80 mg via ORAL

## 2013-06-15 MED ORDER — NALBUPHINE HCL 10 MG/ML IJ SOLN
5.0000 mg | INTRAMUSCULAR | Status: DC | PRN
  Filled 2013-06-15 (×2): qty 1

## 2013-06-15 MED ORDER — OXYTOCIN 10 UNIT/ML IJ SOLN
40.0000 [IU] | INTRAVENOUS | Status: DC | PRN
  Administered 2013-06-15: 40 [IU] via INTRAVENOUS

## 2013-06-15 MED ORDER — WITCH HAZEL-GLYCERIN EX PADS: 1.0000 "application " | MEDICATED_PAD | CUTANEOUS | Status: DC | PRN

## 2013-06-15 MED ORDER — MORPHINE SULFATE (PF) 0.5 MG/ML IJ SOLN
INTRAMUSCULAR | Status: DC | PRN
  Administered 2013-06-15: .15 mg via INTRATHECAL

## 2013-06-15 MED ORDER — 0.9 % SODIUM CHLORIDE (POUR BTL) OPTIME
TOPICAL | Status: DC | PRN
  Administered 2013-06-15: 1000 mL

## 2013-06-15 MED ORDER — SENNOSIDES-DOCUSATE SODIUM 8.6-50 MG PO TABS
2.0000 | ORAL_TABLET | Freq: Every day | ORAL | Status: DC
  Administered 2013-06-16: 2 via ORAL

## 2013-06-15 MED ORDER — OXYTOCIN 40 UNITS IN LACTATED RINGERS INFUSION - SIMPLE MED: 62.5000 mL/h | INTRAVENOUS | Status: AC

## 2013-06-15 MED ORDER — DIPHENHYDRAMINE HCL 25 MG PO CAPS
25.0000 mg | ORAL_CAPSULE | Freq: Four times a day (QID) | ORAL | Status: DC | PRN
  Administered 2013-06-16: 25 mg via ORAL
  Filled 2013-06-15: qty 1

## 2013-06-15 MED ORDER — PRENATAL MULTIVITAMIN CH
1.0000 | ORAL_TABLET | Freq: Every day | ORAL | Status: DC
  Administered 2013-06-16 – 2013-06-17 (×2): 1 via ORAL
  Filled 2013-06-15 (×2): qty 1

## 2013-06-15 MED ORDER — CITRIC ACID-SODIUM CITRATE 334-500 MG/5ML PO SOLN
30.0000 mL | Freq: Once | ORAL | Status: AC
  Administered 2013-06-15: 30 mL via ORAL
  Filled 2013-06-15: qty 15

## 2013-06-15 MED ORDER — SIMETHICONE 80 MG PO CHEW: 80.0000 mg | CHEWABLE_TABLET | ORAL | Status: DC | PRN

## 2013-06-15 MED ORDER — TETANUS-DIPHTH-ACELL PERTUSSIS 5-2.5-18.5 LF-MCG/0.5 IM SUSP
0.5000 mL | Freq: Once | INTRAMUSCULAR | Status: AC
  Administered 2013-06-16: 0.5 mL via INTRAMUSCULAR

## 2013-06-15 MED ORDER — MEPERIDINE HCL 25 MG/ML IJ SOLN: 6.2500 mg | INTRAMUSCULAR | Status: DC | PRN

## 2013-06-15 MED ORDER — ONDANSETRON HCL 4 MG/2ML IJ SOLN: 4.0000 mg | INTRAMUSCULAR | Status: DC | PRN

## 2013-06-15 MED ORDER — CEFAZOLIN SODIUM-DEXTROSE 2-3 GM-% IV SOLR
2.0000 g | Freq: Once | INTRAVENOUS | Status: AC
  Administered 2013-06-15: 2 g via INTRAVENOUS
  Filled 2013-06-15: qty 50

## 2013-06-15 MED ORDER — ZOLPIDEM TARTRATE 5 MG PO TABS: 5.0000 mg | ORAL_TABLET | Freq: Every evening | ORAL | Status: DC | PRN

## 2013-06-15 MED ORDER — FENTANYL CITRATE 0.05 MG/ML IJ SOLN
INTRAMUSCULAR | Status: AC
  Filled 2013-06-15: qty 2

## 2013-06-15 MED ORDER — FENTANYL CITRATE 0.05 MG/ML IJ SOLN
INTRAMUSCULAR | Status: DC | PRN
  Administered 2013-06-15: 25 ug via INTRATHECAL

## 2013-06-15 MED ORDER — ONDANSETRON HCL 4 MG/2ML IJ SOLN: 4.0000 mg | Freq: Three times a day (TID) | INTRAMUSCULAR | Status: DC | PRN

## 2013-06-15 MED ORDER — MENTHOL 3 MG MT LOZG: 1.0000 | LOZENGE | OROMUCOSAL | Status: DC | PRN

## 2013-06-15 MED ORDER — KETOROLAC TROMETHAMINE 30 MG/ML IJ SOLN: 30.0000 mg | Freq: Four times a day (QID) | INTRAMUSCULAR | Status: AC | PRN

## 2013-06-15 MED ORDER — LANOLIN HYDROUS EX OINT: 1.0000 "application " | TOPICAL_OINTMENT | CUTANEOUS | Status: DC | PRN

## 2013-06-15 MED ORDER — PHENYLEPHRINE HCL 10 MG/ML IJ SOLN
INTRAMUSCULAR | Status: DC | PRN
  Administered 2013-06-15: 80 ug via INTRAVENOUS
  Administered 2013-06-15 (×3): 40 ug via INTRAVENOUS

## 2013-06-15 MED ORDER — LACTATED RINGERS IV SOLN
INTRAVENOUS | Status: DC
  Administered 2013-06-15 (×5): via INTRAVENOUS

## 2013-06-15 MED ORDER — BUPIVACAINE IN DEXTROSE 0.75-8.25 % IT SOLN
INTRATHECAL | Status: DC | PRN
  Administered 2013-06-15: 14 mg via INTRATHECAL

## 2013-06-15 MED ORDER — OXYCODONE-ACETAMINOPHEN 5-325 MG PO TABS
1.0000 | ORAL_TABLET | ORAL | Status: DC | PRN
  Administered 2013-06-16: 1 via ORAL
  Administered 2013-06-16 (×2): 2 via ORAL
  Administered 2013-06-16: 1 via ORAL
  Administered 2013-06-17: 2 via ORAL
  Administered 2013-06-17: 1 via ORAL
  Administered 2013-06-17: 2 via ORAL
  Filled 2013-06-15: qty 2
  Filled 2013-06-15: qty 1
  Filled 2013-06-15 (×3): qty 2
  Filled 2013-06-15: qty 1

## 2013-06-15 MED ORDER — MORPHINE SULFATE 0.5 MG/ML IJ SOLN
INTRAMUSCULAR | Status: AC
  Filled 2013-06-15: qty 10

## 2013-06-15 MED ORDER — IBUPROFEN 600 MG PO TABS
600.0000 mg | ORAL_TABLET | Freq: Four times a day (QID) | ORAL | Status: DC
  Administered 2013-06-16 – 2013-06-17 (×7): 600 mg via ORAL
  Filled 2013-06-15 (×7): qty 1

## 2013-06-15 MED ORDER — ONDANSETRON HCL 4 MG/2ML IJ SOLN
INTRAMUSCULAR | Status: AC
  Filled 2013-06-15: qty 2

## 2013-06-15 MED ORDER — DIBUCAINE 1 % RE OINT: 1.0000 "application " | TOPICAL_OINTMENT | RECTAL | Status: DC | PRN

## 2013-06-15 MED ORDER — NALOXONE HCL 1 MG/ML IJ SOLN: 1.0000 ug/kg/h | INTRAMUSCULAR | Status: DC | PRN

## 2013-06-15 MED ORDER — HYDROMORPHONE HCL PF 1 MG/ML IJ SOLN
1.0000 mg | Freq: Once | INTRAMUSCULAR | Status: AC
  Administered 2013-06-15: 1 mg via INTRAVENOUS
  Filled 2013-06-15: qty 1

## 2013-06-15 MED ORDER — OXYTOCIN 10 UNIT/ML IJ SOLN
INTRAMUSCULAR | Status: AC
  Filled 2013-06-15: qty 4

## 2013-06-15 MED ORDER — FAMOTIDINE IN NACL 20-0.9 MG/50ML-% IV SOLN
20.0000 mg | Freq: Once | INTRAVENOUS | Status: AC
  Administered 2013-06-15: 20 mg via INTRAVENOUS
  Filled 2013-06-15: qty 50

## 2013-06-15 MED ORDER — NALBUPHINE HCL 10 MG/ML IJ SOLN
5.0000 mg | INTRAMUSCULAR | Status: DC | PRN
  Administered 2013-06-15: 5 mg via INTRAVENOUS
  Filled 2013-06-15: qty 1

## 2013-06-15 MED ORDER — FENTANYL CITRATE 0.05 MG/ML IJ SOLN: 25.0000 ug | INTRAMUSCULAR | Status: DC | PRN

## 2013-06-15 SURGICAL SUPPLY — 29 items
CLAMP CORD UMBIL (MISCELLANEOUS) ×2 IMPLANT
CLOTH BEACON ORANGE TIMEOUT ST (SAFETY) ×2 IMPLANT
DRAPE LG THREE QUARTER DISP (DRAPES) ×2 IMPLANT
DRSG OPSITE POSTOP 4X10 (GAUZE/BANDAGES/DRESSINGS) ×2 IMPLANT
DURAPREP 26ML APPLICATOR (WOUND CARE) ×2 IMPLANT
ELECT REM PT RETURN 9FT ADLT (ELECTROSURGICAL) ×2
ELECTRODE REM PT RTRN 9FT ADLT (ELECTROSURGICAL) ×1 IMPLANT
EXTRACTOR VACUUM M CUP 4 TUBE (SUCTIONS) IMPLANT
GLOVE BIO SURGEON STRL SZ 6.5 (GLOVE) ×2 IMPLANT
GLOVE BIOGEL PI IND STRL 6.5 (GLOVE) ×1 IMPLANT
GLOVE BIOGEL PI INDICATOR 6.5 (GLOVE) ×1
GOWN STRL REIN XL XLG (GOWN DISPOSABLE) ×4 IMPLANT
KIT ABG SYR 3ML LUER SLIP (SYRINGE) IMPLANT
NEEDLE HYPO 25X5/8 SAFETYGLIDE (NEEDLE) IMPLANT
NS IRRIG 1000ML POUR BTL (IV SOLUTION) ×2 IMPLANT
PACK C SECTION WH (CUSTOM PROCEDURE TRAY) ×2 IMPLANT
PAD OB MATERNITY 4.3X12.25 (PERSONAL CARE ITEMS) ×2 IMPLANT
RTRCTR C-SECT PINK 25CM LRG (MISCELLANEOUS) ×2 IMPLANT
STAPLER VISISTAT 35W (STAPLE) IMPLANT
SUT MON AB 2-0 CT1 27 (SUTURE) ×2 IMPLANT
SUT MON AB 4-0 PS1 27 (SUTURE) IMPLANT
SUT PDS AB 0 CTX 60 (SUTURE) IMPLANT
SUT PLAIN 2 0 XLH (SUTURE) IMPLANT
SUT VIC AB 0 CTX 36 (SUTURE) ×4
SUT VIC AB 0 CTX36XBRD ANBCTRL (SUTURE) ×4 IMPLANT
SUT VIC AB 4-0 KS 27 (SUTURE) IMPLANT
TOWEL OR 17X24 6PK STRL BLUE (TOWEL DISPOSABLE) ×6 IMPLANT
TRAY FOLEY CATH 14FR (SET/KITS/TRAYS/PACK) ×2 IMPLANT
WATER STERILE IRR 1000ML POUR (IV SOLUTION) IMPLANT

## 2013-06-15 NOTE — Anesthesia Postprocedure Evaluation (Signed)
  Anesthesia Post-op Note  Patient: Michele Avila  Procedure(s) Performed: Procedure(s): CESAREAN SECTION (N/A)  Patient Location: PACU  Anesthesia Type:Spinal  Level of Consciousness: awake, alert  and oriented  Airway and Oxygen Therapy: Patient Spontanous Breathing  Post-op Pain: none  Post-op Assessment: Post-op Vital signs reviewed, Patient's Cardiovascular Status Stable, Respiratory Function Stable, Patent Airway, No signs of Nausea or vomiting, Pain level controlled, No headache and No backache  Post-op Vital Signs: Reviewed and stable  Complications: No apparent anesthesia complications

## 2013-06-15 NOTE — MAU Note (Signed)
Patient states she has had mucus discharge and thinks her water might have broken while urinating. States she is having contractions every 7 minutes. Patient is scheduled for a repeat cesarean section tomorrow.

## 2013-06-15 NOTE — Brief Op Note (Signed)
06/15/2013  1:18 PM  PATIENT:  Michele Avila  22 y.o. female  PRE-OPERATIVE DIAGNOSIS:  previous cesarean section, ruptured in labor  POST-OPERATIVE DIAGNOSIS:  previous cesarean section, ruptured in labor  PROCEDURE:  Procedure(s): CESAREAN SECTION (N/A)  SURGEON:  Surgeon(s) and Role:    * Philip Aspen, DO - Primary  PHYSICIAN ASSISTANT:   ASSISTANTS: none   ANESTHESIA:   spinal  EBL:  Total I/O In: 3600 [I.V.:3600] Out: 800 [Urine:200; Blood:600]  SPECIMEN:  Source of Specimen:  cord blood  DISPOSITION OF SPECIMEN:  N/A  PLAN OF CARE: Admit to inpatient   PATIENT DISPOSITION:  PACU - hemodynamically stable.

## 2013-06-15 NOTE — Anesthesia Preprocedure Evaluation (Signed)
Anesthesia Evaluation  Patient identified by MRN, date of birth, ID band Patient awake    Reviewed: Allergy & Precautions, H&P , NPO status , Patient's Chart, lab work & pertinent test results  History of Anesthesia Complications (+) PONV  Airway Mallampati: III TM Distance: >3 FB Neck ROM: Full    Dental no notable dental hx. (+) Teeth Intact   Pulmonary neg pulmonary ROS, asthma ,  breath sounds clear to auscultation  Pulmonary exam normal       Cardiovascular negative cardio ROS  Rhythm:Regular Rate:Normal     Neuro/Psych negative neurological ROS  negative psych ROS   GI/Hepatic negative GI ROS, Neg liver ROS,   Endo/Other  negative endocrine ROS  Renal/GU negative Renal ROS  negative genitourinary   Musculoskeletal negative musculoskeletal ROS (+)   Abdominal   Peds  Hematology  (+) Blood dyscrasia, anemia ,   Anesthesia Other Findings   Reproductive/Obstetrics Previous C/Section in labor                           Anesthesia Physical Anesthesia Plan  ASA: II and emergent  Anesthesia Plan: Spinal   Post-op Pain Management:    Induction:   Airway Management Planned: Natural Airway  Additional Equipment:   Intra-op Plan:   Post-operative Plan:   Informed Consent: I have reviewed the patients History and Physical, chart, labs and discussed the procedure including the risks, benefits and alternatives for the proposed anesthesia with the patient or authorized representative who has indicated his/her understanding and acceptance.   Dental advisory given  Plan Discussed with: CRNA, Anesthesiologist and Surgeon  Anesthesia Plan Comments:         Anesthesia Quick Evaluation

## 2013-06-15 NOTE — MAU Note (Signed)
3cm at office visit on Tuesday. Is having bloody tinged mucus when wipes. Unsure if leaking; states has lost mucus plug yesterday. Contracting about every 7 minutes per patient.

## 2013-06-15 NOTE — Transfer of Care (Signed)
Immediate Anesthesia Transfer of Care Note  Patient: Michele Avila  Procedure(s) Performed: Procedure(s): CESAREAN SECTION (N/A)  Patient Location: PACU  Anesthesia Type:Spinal  Level of Consciousness: awake, alert  and oriented  Airway & Oxygen Therapy: Patient Spontanous Breathing  Post-op Assessment: Report given to PACU RN and Post -op Vital signs reviewed and stable  Post vital signs: Reviewed and stable  Complications: No apparent anesthesia complications

## 2013-06-15 NOTE — MAU Provider Note (Signed)
S: 22 y.o. [id number] @[redacted]w[redacted]d  presents to MAU with leaking of mucous and small amount of clear fluid this morning. This occurs only when when she uses the bathroom and has not required a pad. She reports good fetal movement, denies vaginal bleeding, vaginal itching/burning, urinary symptoms, h/a, dizziness, n/v, or fever/chills.    O: BP 125/87  Pulse 89  Temp(Src) 97.5 F (36.4 C) (Oral)  Resp 18  Ht 5\' 8"  (1.727 m)  Wt 94.348 kg (208 lb)  BMI 31.63 kg/m2  SpO2 99%  LMP 08/01/2012 Results for orders placed during the hospital encounter of 06/15/13 (from the past 24 hour(s))  AMNISURE RUPTURE OF MEMBRANE (ROM)     Status: None   Collection Time    06/15/13 10:22 AM      Result Value Range   Amnisure ROM POSITIVE    POCT FERN TEST     Status: None   Collection Time    06/15/13 10:37 AM      Result Value Range   POCT Fern Test Negative = intact amniotic membranes     Speculum exam: Negative pooling of fluid with cough/bearing down but significant mucous like discharge and shiny clear but thick discharge Dilation: 3 Effacement (%): 60 Cervical Position: Anterior Station: -3 Presentation: Vertex Exam by:: Estill Bamberg CNM A: Amnisure positive SROM at term with hx previous C/S and desired repeat  P: RN to call Dr Marko Stai Certified Nurse-Midwife

## 2013-06-15 NOTE — Anesthesia Procedure Notes (Signed)
Spinal  Patient location during procedure: OR Start time: 06/15/2013 12:09 PM Staffing Anesthesiologist: Quartez Lagos A. Performed by: anesthesiologist  Preanesthetic Checklist Completed: patient identified, site marked, surgical consent, pre-op evaluation, timeout performed, IV checked, risks and benefits discussed and monitors and equipment checked Spinal Block Patient position: sitting Prep: site prepped and draped and DuraPrep Patient monitoring: heart rate, cardiac monitor, continuous pulse ox and blood pressure Approach: midline Location: L3-4 Injection technique: single-shot Needle Needle type: Sprotte  Needle gauge: 24 G Needle length: 9 cm Assessment Sensory level: T3 Events: paresthesia Additional Notes Patient tolerated procedure well. Transient paresthesia right leg. Adequate sensory level.

## 2013-06-15 NOTE — H&P (Signed)
22 y.o. [id number] Estimated Date of Delivery: 06/18/13 admitted at 39/[redacted] weeks gestation for repeat C/S.  Presents with suspected SROM, fern neg but Amnisure positive.  She reports painful regular contractions.  Latex allergy.  Prenatal Transfer Tool   Maternal Diabetes: No  Genetic Screening: Normal  Maternal Ultrasounds/Referrals: Normal  Fetal Ultrasounds or other Referrals: None  Maternal Substance Abuse: No  Significant Maternal Medications: None  Significant Maternal Lab Results: None  Other Significant Pregnancy Complications: Previous cesarean delivery.   Uncomplicated pregnancy.  Afebrile, VSS  Heart and Lungs: No active disease  Abdomen: soft, gravid, EFW LGA (>90%ile).  Cervical exam: 2/50  Impression: Previous cesarean delivery. Patient desires repeat.  Plan: Repeat C/S. 2g Ancef, latex free OR setup

## 2013-06-16 ENCOUNTER — Encounter (HOSPITAL_COMMUNITY): Payer: Self-pay | Admitting: Obstetrics and Gynecology

## 2013-06-16 ENCOUNTER — Inpatient Hospital Stay (HOSPITAL_COMMUNITY)
Admission: RE | Admit: 2013-06-16 | Payer: Medicaid Other | Source: Ambulatory Visit | Admitting: Obstetrics & Gynecology

## 2013-06-16 LAB — CBC
MCV: 86.8 fL (ref 78.0–100.0)
Platelets: 183 10*3/uL (ref 150–400)
RBC: 3.26 MIL/uL — ABNORMAL LOW (ref 3.87–5.11)
WBC: 8 10*3/uL (ref 4.0–10.5)

## 2013-06-16 SURGERY — Surgical Case
Anesthesia: Regional

## 2013-06-16 MED ORDER — PNEUMOCOCCAL VAC POLYVALENT 25 MCG/0.5ML IJ INJ
0.5000 mL | INJECTION | Freq: Once | INTRAMUSCULAR | Status: AC
  Administered 2013-06-17: 0.5 mL via INTRAMUSCULAR
  Filled 2013-06-16: qty 0.5

## 2013-06-16 NOTE — Progress Notes (Signed)
Patient is eating, ambulating, voiding.  Pain control is good.  No flatus.  Appropriate lochia.  Filed Vitals:   06/16/13 0500 06/16/13 0535 06/16/13 0600 06/16/13 0810  BP:  106/73  116/76  Pulse:  62  67  Temp:  97.5 F (36.4 C)  97.7 F (36.5 C)  TempSrc:  Oral  Oral  Resp:  18  16  Height:      Weight:      SpO2: 99%  97% 99%    Fundus firm Perineum without swelling. No CT Inc: c/d/i  Lab Results  Component Value Date   WBC 8.0 06/16/2013   HGB 9.5* 06/16/2013   HCT 28.3* 06/16/2013   MCV 86.8 06/16/2013   PLT 183 06/16/2013    --/--/O NEG (07/23 0845)  A/P Post op day #1 s/p repeat c/s Circ in office.  Routine care.  Expect d/c 7/26.    Philip Aspen

## 2013-06-16 NOTE — Anesthesia Postprocedure Evaluation (Signed)
  Anesthesia Post-op Note  Patient: Michele Avila  Procedure(s) Performed: Procedure(s): CESAREAN SECTION (N/A)  Patient Location: PACU and Mother/Baby  Anesthesia Type:Spinal  Level of Consciousness: awake, alert  and oriented  Airway and Oxygen Therapy: Patient Spontanous Breathing  Post-op Pain: none  Post-op Assessment: Patient's Cardiovascular Status Stable and Respiratory Function Stable  Post-op Vital Signs: Reviewed and stable  Complications: No apparent anesthesia complications

## 2013-06-16 NOTE — Op Note (Signed)
NAMESLOAN, GALENTINE               ACCOUNT NO.:  [id number]  MEDICAL RECORD NO.:  [id number]  LOCATION:  9125                          FACILITY:  [facility]  PHYSICIAN:  Philip Aspen, DO    DATE OF BIRTH:  1991-10-03  DATE OF PROCEDURE:  06/15/2013 DATE OF DISCHARGE:                              OPERATIVE REPORT   PREOPERATIVE DIAGNOSIS:  Repeat cesarean section.  POSTOPERATIVE DIAGNOSIS:  Repeat cesarean section.  PROCEDURE:  Low-transverse cesarean section with lysis of adhesions.  SURGEON:  Philip Aspen, DO  ANESTHESIA:  Spinal.  FLUIDS:  3600 mL.  BLOOD LOSS:  600 mL.  URINE OUTPUT:  200 mL.  FINDINGS:  Normal uterus, bilateral tubes and ovaries, omental adhesions to the anterior abdominal wall, filmy adhesions lateral to the lower uterine segment, and a female infant in cephalic presentation with Apgars of 8 and 9.  COMPLICATIONS:  None.  CONDITION:  Stable to PACU.  DESCRIPTION OF PROCEDURE:  The patient was taken to the operating room, where spinal anesthesia was administered and found to be adequate.  She was then prepped and draped in the normal sterile fashion in the dorsal supine position with a leftward tilt.  A Pfannenstiel skin incision was made with a scalpel and carried down to the underlying layer of fascia with Bovie cautery.  The fascia was incised with a scalpel at the midline and extended laterally with Mayo scissors.  Kocher clamps were placed at the superior aspect of the fascial incision, and the rectus muscles were dissected off bluntly and sharply.  Of note, the rectus muscles were separated, and scar tissue involving the peritoneal cavity was coming through those rectus muscles.  Kocher clamps were then placed at the inferior aspect of the fascial incision, and the rectus muscles were dissected off bluntly and sharply.  The rectus muscles were separated, and the peritoneal cavity was entered bluntly.  This incision was extended by manual  lateral traction.  Metzenbaum scissors were used to further extend the peritoneal incision by transecting the urachus. The abdominal cavity was inspected manually, and no adhesive disease was found in the upper abdomen.  The [employer] was placed, and a bladder flap was then created with good visualization of the bladder. Scalpel was used to create a low-transverse cesarean incision.  The amniotic sac was entered bluntly with Allis clamps and the incision extended by caudal and cephalic traction.  The infant's head was elevated and delivered without difficulty through that incision followed by the remainder of the body.  The cord was clamped and cut, and the infant was handed off to awaiting Neonatology.  The placenta was removed by gentle traction on the umbilical cord and external uterine massage. All clot and debris were removed from the uterine cavity, and the uterine incision was closed with Vicryl in a running, locked fashion with a second layer of horizontal Lembert imbrication.  One additional figure-of-eight was placed along the uterine incision, just right of medial.  Excellent hemostasis was noted.  The Alexis self-retractor was removed, and additional clot and debris was removed from the pelvis. The peritoneum was grasped with curved Kelly clamps, and a large Richardson retractor was used to visualize  the upper most point.  It was noted that a loop of omentum was attached to the anterior abdominal wall.  This was suture ligated x2.  Cautery was used to separate in between.  Excellent hemostasis was noted.  The peritoneum was closed with 4-0 Monocryl in a running fashion followed by reapproximation of the rectus muscles continuous with the peritoneal closure.  Fascia was then reapproximated after inspection of fascial and musculature surfaces was found to be hemostatic.  Fascia was closed with Vicryl in a running fashion.  Subcutaneous tissue was irrigated, dried, and  minimal use of Bovie cautery was used for hemostasis.  Skin was reapproximated and closed with staples.  Sponge, lap, and needle counts were correct x2. The patient was taken to recovery in stable condition.          ______________________________ Philip Aspen, DO     [place]/MEDQ  D:  06/16/2013  T:  06/16/2013  Job:  [id number]

## 2013-06-16 NOTE — Progress Notes (Signed)
UR chart review completed.  

## 2013-06-16 NOTE — Progress Notes (Signed)
Clinical Social Work Department PSYCHOSOCIAL ASSESSMENT - MATERNAL/CHILD 06/16/2013  Patient:  Michele Avila,Michele Avila  Account Number:  [id number]  Admit Date:  06/15/2013  Childs Name:   Michele Avila    Clinical Social Worker:  Shyler Holzman, LCSW   Date/Time:  06/16/2013 10:30 AM  Date Referred:  06/16/2013   Referral source  CN     Referred reason  Substance Abuse   Other referral source:    I:  FAMILY / HOME ENVIRONMENT Child's legal guardian:  PARENT  Guardian - Name Guardian - Age Guardian - Address  Michele Avila 22 [address], [place], Michele Av[place] [address]  Michele Avila 25 same   Other household support members/support persons Name Relationship DOB  Michele Avila MOTHER    Other support:   MOB reports having a good support system.    II  PSYCHOSOCIAL DATA Information Source:  Family Interview  Financial and Community Resources Employment:   MOB-time off from [employer]  [employer]   Financial resources:  Medicaid If Medicaid - County:  [place]  School / Grade:   Maternity Care Coordinator / Child Services Coordination / Early Interventions:  Cultural issues impacting care:   None stated    III  STRENGTHS Strengths  Adequate Resources  Compliance with medical plan  Home prepared for Child (including basic supplies)  Other - See comment  Supportive family/friends   Strength comment:  Pediatric follow up will be with Dr. Rubin   IV  RISK FACTORS AND CURRENT PROBLEMS Current Problem:  YES   Risk Factor & Current Problem Patient Issue Family Issue Risk Factor / Current Problem Comment  Substance Abuse Y N MOB-marijuana   N N     V  SOCIAL WORK ASSESSMENT  CSW met with MOB and her mother in MOB's first floor room/125 to complete assessment for hx of marijuana use.  Chart notes last use 1 weeks ago.  MOB was very friendly and states we can discuss anything with her mother present.  She reports feeling well, but sore, and that baby is doing well also.  She  was open about her anxiety related to L&D and now related to caring for baby.  She feels it is a normal new mother feeling, and not extreme, but something CSW discussed in depth with her.  CSW discussed other signs and symptoms of PPD.  MOB was open about her son born almost 6 years ago who was adopted by a family who lives in [place].  MOB states she thought her emotions would be heightened at baby's delivery because of her hx and thought she would feel some guilt over her decision 6 years ago with baby's arrival.  She states this was true to some degree, but feels she is coping well at this time.  CSW asked if she had post adoption counseling and she said yes.  CSW discussed the benefits of ongoing counseling and inquired about MOB's interest in being referred to a counselor.  She states she is interested in having more information and feels she can make her own appointment if she feels it necessary at any point in time.  CSW provided her with a list of counselors in [place] who accept Medicaid.  CSW asked about MOB's hx of marijuana use and she states she cannot remember the last time she used, but reports using it due to nausea.  She states no plans to use going forward and was understanding of hospital drug screen policy explained by CSW.  CSW   informed her that baby's UDS was negative and that CSW will have to make a report to Child Protective Services in the event MDS is positive.  She stated understanding.  She reports having everything she needs for baby at home and seemed appreciative of CSW's concern for her wellbeing.  CSW identifies no further questions or barriers to discharge when MOB and baby are medically ready.   VI SOCIAL WORK PLAN Social Work Plan  No Further Intervention Required / No Barriers to Discharge  Patient/Family Education  Information/Referral to Community Resources   Type of pt/family education:   PPD signs and symptoms  Benefits of outpatient counseling  Hospital  drug screen policy   If child protective services report - county:   If child protective services report - date:   Information/referral to community resources comment:   Information for counselors in [place] accepting Medicaid   Other social work plan:   CSW will monitor MDS    

## 2013-06-16 NOTE — Lactation Note (Signed)
This note was copied from the chart of Michele Sumner Boesch. Lactation Consultation Note Mom states br feeding is not going well; states her nipples are flat and baby has not latched well; baby now 64 hours old; mom has given a little formula to supplement. Offered to assist, mom accepts. Attempted to latch baby on the left in cross cradle hold, mom's nipples are flat and baby did not latch. Mom can hand express large drops of colostrum.  Initiated nipple shield, baby latched right away and maintained his latch for 10 minutes. However, there was minimal colostrum evident in the nipple shield. Baby then fell asleep at the breast.  Set up DEBP, demonstrated to mom how to use, written instructions provided. Mom is to attempt latch with nipple shield, pump 4 to 6 times per day, and feed pumped breast milk if available, or formula to baby if he is still showing hunger cues. Mom states understanding and agreement with the feeding plan. Questions answered.   Patient Name: Michele Avila [id number] Date: 06/16/2013 Reason for consult: Follow-up assessment   Maternal Data    Feeding Feeding Type: Breast Milk Length of feed: 5 min  LATCH Score/Interventions Latch: Grasps breast easily, tongue down, lips flanged, rhythmical sucking.  Audible Swallowing: None  Type of Nipple: Flat  Comfort (Breast/Nipple): Soft / non-tender     Hold (Positioning): Assistance needed to correctly position infant at breast and maintain latch.  LATCH Score: 6  Lactation Tools Discussed/Used Tools: Nipple Shields Nipple shield size: 20 Pump Review: Setup, frequency, and cleaning;Milk Storage   Consult Status Consult Status: Follow-up Follow-up type: In-patient    Octavio Manns [facility] 06/16/2013, 3:41 PM

## 2013-06-17 MED ORDER — OXYCODONE-ACETAMINOPHEN 5-325 MG PO TABS: 1.0000 | ORAL_TABLET | ORAL | Status: DC | PRN

## 2013-06-17 NOTE — Discharge Summary (Signed)
Obstetric Discharge Summary Reason for Admission: cesarean section Prenatal Procedures: none Intrapartum Procedures: cesarean: low cervical, transverse Postpartum Procedures: none Complications-Operative and Postpartum: none Hemoglobin  Date Value Range Status  06/16/2013 9.5* 12.0 - 15.0 g/dL Final     REPEATED TO VERIFY     DELTA CHECK NOTED     HCT  Date Value Range Status  06/16/2013 28.3* 36.0 - 46.0 % Final    Physical Exam:  General: alert and cooperative Lochia: appropriate Uterine Fundus: firm Incision: healing well, no significant drainage, no significant erythema DVT Evaluation: No evidence of DVT seen on physical exam.  Discharge Diagnoses: Term Pregnancy-delivered  Discharge Information: Date: 06/17/2013 Activity: pelvic rest Diet: routine Medications: PNV, Ibuprofen and Percocet Condition: stable Instructions: refer to practice specific booklet Discharge to: home Follow-up Information   Follow up with Michele Eppinger, DO In 2 weeks.   Contact information:   [address] [place] [address] [phone]       Newborn Data: Live born female  Birth Weight: 8 lb 13.6 oz (4015 g) APGAR: 8, 9  Home with mother.  Philip Aspen 06/17/2013, 7:59 AM

## 2013-12-18 ENCOUNTER — Emergency Department (HOSPITAL_COMMUNITY): Payer: Self-pay

## 2013-12-18 ENCOUNTER — Encounter (HOSPITAL_COMMUNITY): Payer: Self-pay | Admitting: Emergency Medicine

## 2013-12-18 ENCOUNTER — Emergency Department (HOSPITAL_COMMUNITY)
Admission: EM | Admit: 2013-12-18 | Discharge: 2013-12-18 | Disposition: A | Discharge status: Home / Self Care | Payer: Self-pay | Attending: Emergency Medicine | Admitting: Emergency Medicine

## 2013-12-18 DIAGNOSIS — J069 Acute upper respiratory infection, unspecified: Secondary | ICD-10-CM | POA: Insufficient documentation

## 2013-12-18 DIAGNOSIS — J988 Other specified respiratory disorders: Secondary | ICD-10-CM

## 2013-12-18 DIAGNOSIS — J45901 Unspecified asthma with (acute) exacerbation: Secondary | ICD-10-CM | POA: Insufficient documentation

## 2013-12-18 DIAGNOSIS — R1110 Vomiting, unspecified: Secondary | ICD-10-CM | POA: Insufficient documentation

## 2013-12-18 DIAGNOSIS — Z8781 Personal history of (healed) traumatic fracture: Secondary | ICD-10-CM | POA: Insufficient documentation

## 2013-12-18 DIAGNOSIS — B9789 Other viral agents as the cause of diseases classified elsewhere: Secondary | ICD-10-CM

## 2013-12-18 DIAGNOSIS — R079 Chest pain, unspecified: Secondary | ICD-10-CM | POA: Insufficient documentation

## 2013-12-18 DIAGNOSIS — Z87891 Personal history of nicotine dependence: Secondary | ICD-10-CM | POA: Insufficient documentation

## 2013-12-18 DIAGNOSIS — Z79899 Other long term (current) drug therapy: Secondary | ICD-10-CM | POA: Insufficient documentation

## 2013-12-18 DIAGNOSIS — Z862 Personal history of diseases of the blood and blood-forming organs and certain disorders involving the immune mechanism: Secondary | ICD-10-CM | POA: Insufficient documentation

## 2013-12-18 DIAGNOSIS — Z87440 Personal history of urinary (tract) infections: Secondary | ICD-10-CM | POA: Insufficient documentation

## 2013-12-18 DIAGNOSIS — Z91040 Latex allergy status: Secondary | ICD-10-CM | POA: Insufficient documentation

## 2013-12-18 DIAGNOSIS — Z9109 Other allergy status, other than to drugs and biological substances: Secondary | ICD-10-CM | POA: Insufficient documentation

## 2013-12-18 IMAGING — CR DG CHEST 2V
2 series · 2 of 2 positions shown · non-contrast
Comparison: [DATE].

CLINICAL DATA: Chest tightness.  Fever.  Cough.

EXAM:
CHEST  2 VIEW

[w chest pa]
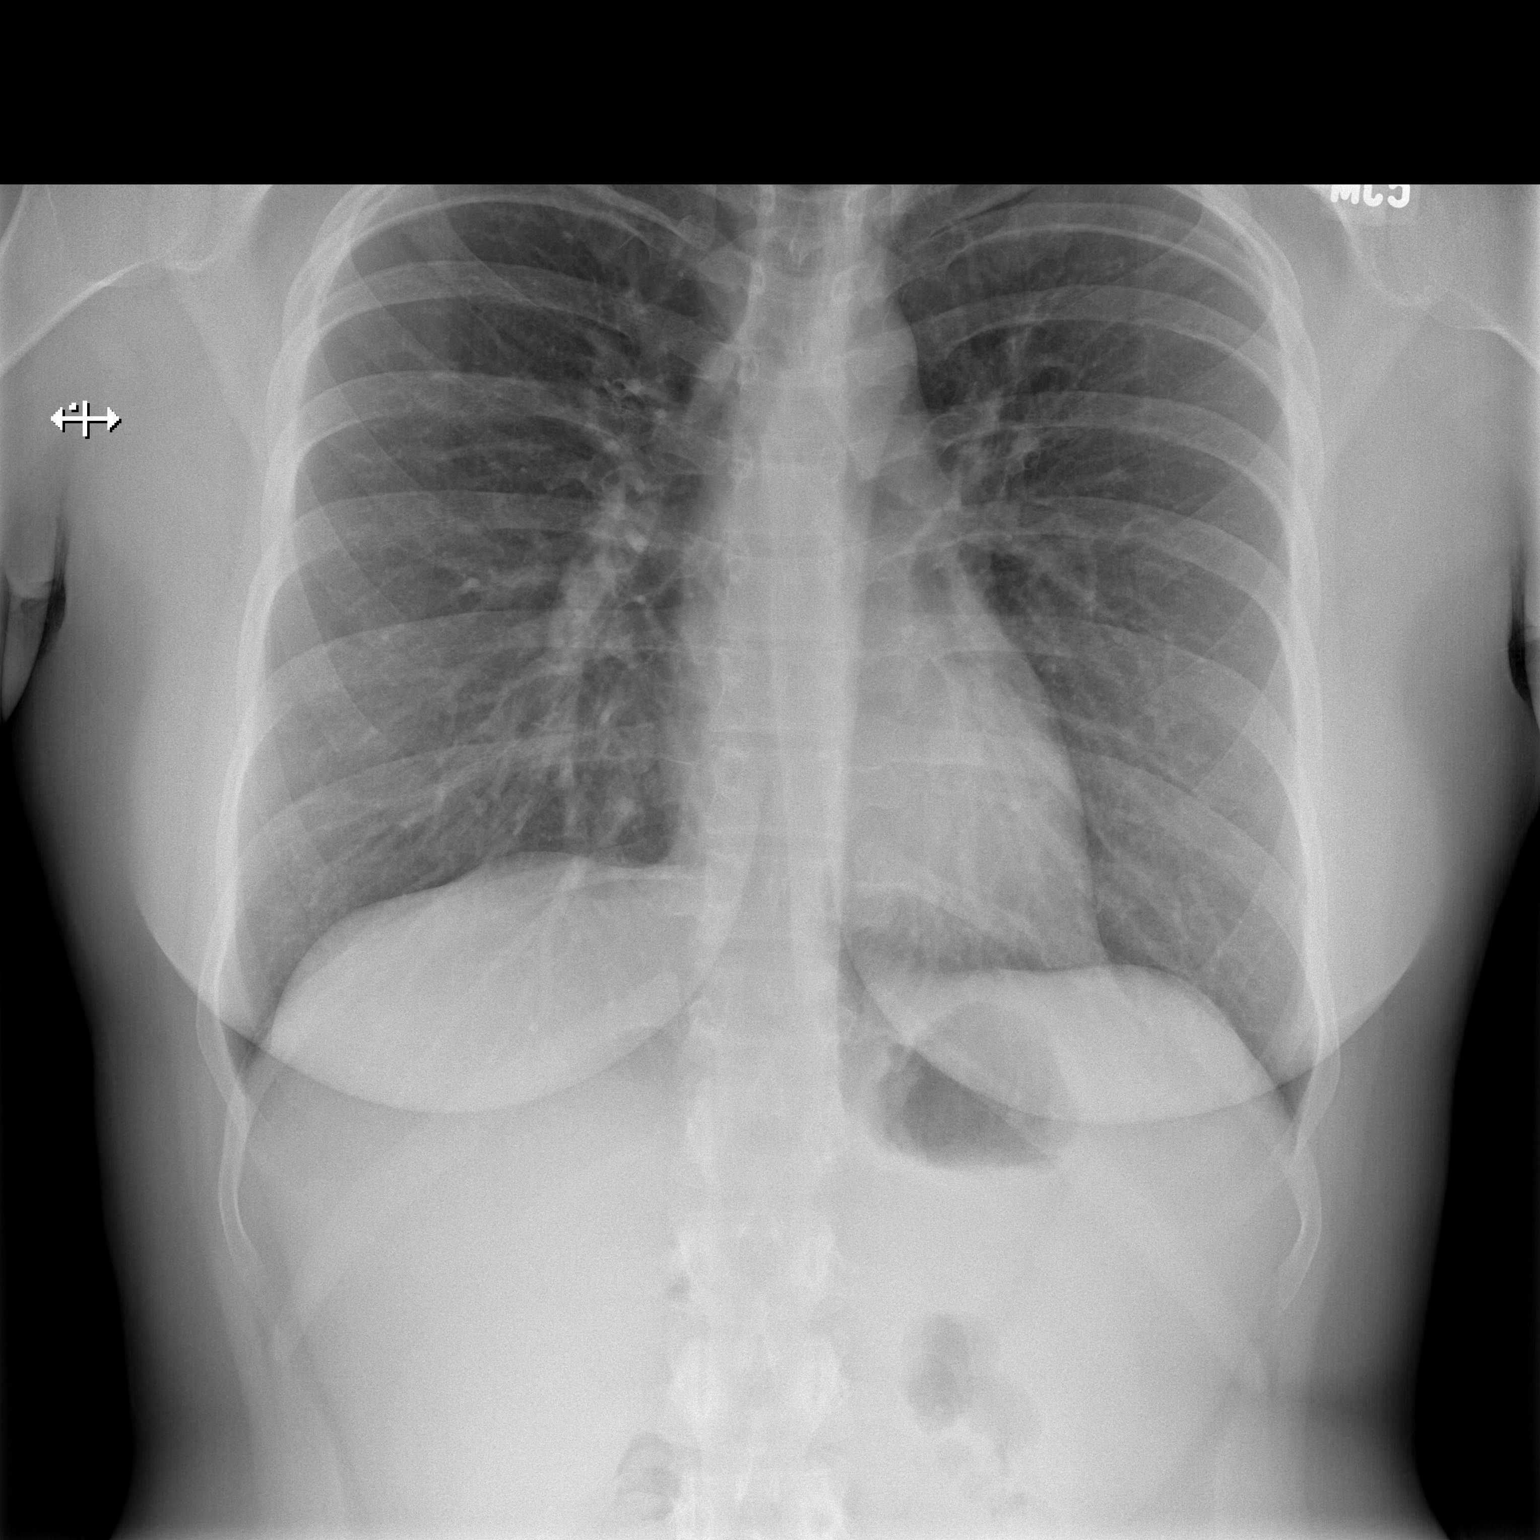

[w chest lat]
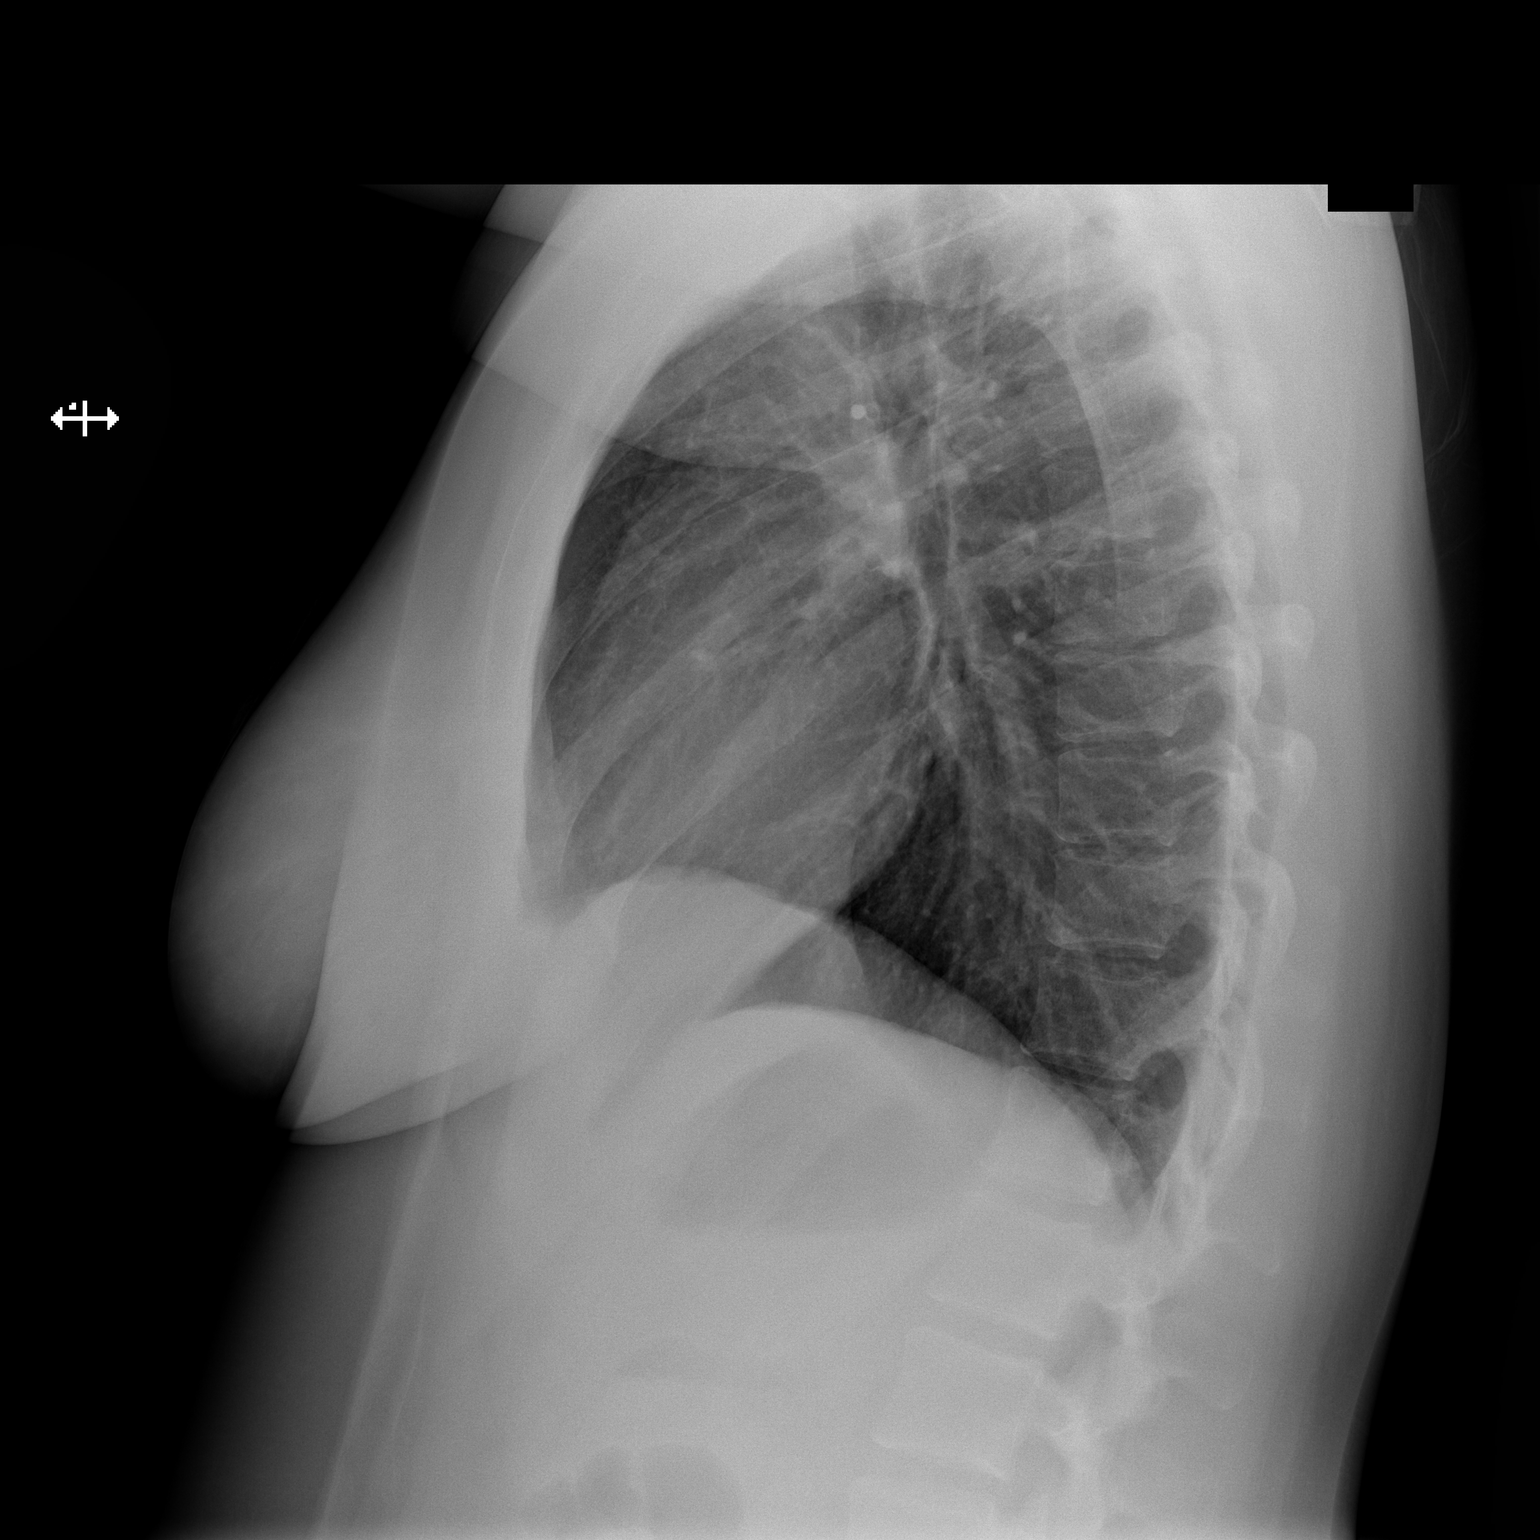

[2 of 2 positions shown; findings below may reference images not displayed]

FINDINGS: The heart size and mediastinal contours are within normal limits.
Both lungs are clear. The visualized skeletal structures are
unremarkable.
IMPRESSION: No active cardiopulmonary disease.

## 2013-12-18 MED ORDER — ALBUTEROL SULFATE (2.5 MG/3ML) 0.083% IN NEBU
5.0000 mg | INHALATION_SOLUTION | Freq: Once | RESPIRATORY_TRACT | Status: AC
  Administered 2013-12-18: 5 mg via RESPIRATORY_TRACT
  Filled 2013-12-18: qty 6

## 2013-12-18 MED ORDER — HYDROCOD POLST-CHLORPHEN POLST 10-8 MG/5ML PO LQCR
5.0000 mL | Freq: Once | ORAL | Status: AC
  Administered 2013-12-18: 5 mL via ORAL
  Filled 2013-12-18: qty 5

## 2013-12-18 MED ORDER — HYDROCODONE-HOMATROPINE 5-1.5 MG/5ML PO SYRP: 5.0000 mL | ORAL_SOLUTION | Freq: Four times a day (QID) | ORAL | Status: DC | PRN

## 2013-12-18 NOTE — ED Provider Notes (Signed)
CSN: [id number]     Arrival date & time 12/18/13  1350 History   First MD Initiated Contact with Patient 12/18/13 1417    This chart was scribed for Clayton Bibles PA-C, a non-physician practitioner working with Alfonzo Feller, DO by Denice Bors, ED Scribe. This patient was seen in room TR10C/TR10C and the patient's care was started at 2:59 PM     No chief complaint on file.  (Consider location/radiation/quality/duration/timing/severity/associated sxs/prior Treatment) The history is provided by the patient. No language interpreter was used.   HPI Comments: Michele Avila is a 23 y.o. female who presents to the Emergency Department complaining of intermittent non-productive cough onset 2 days. Reports associated subjective fever, headache, chills, chest pain, emesis, shortness of breath, sore throat onset last night. Describes throat at "scratchy". Denies any aggravating factors. Reports taking 600 mg of ibuprofen with mild relief of symptoms. Denies associated generalized myalgias, nausea, abdominal pain, leg swelling, and dysuria. Denies sick contacts.      Past Medical History  Diagnosis Date  . Seasonal allergies   . Environmental allergies   . Anemia   . Infection     UTI  . Wrist fracture, right   . Asthma     hx chronic bronchitis/exercise induced asthma as pre teen only   Past Surgical History  Procedure Laterality Date  . Cesarean section  2008  . Dilation and curettage of uterus  08/2011  . Induced abortion    . Cesarean section N/A 06/15/2013    Procedure: CESAREAN SECTION;  Surgeon: Allyn Kenner, DO;  Location: [facility];  Service: Obstetrics;  Laterality: N/A;   Family History  Problem Relation Age of Onset  . Hypertension Mother   . Cancer Father     liver  . Diabetes Father   . Hypertension Father   . Hypertension Maternal Grandmother   . Kidney disease Maternal Grandmother   . Diabetes Paternal Grandmother   . Hypertension Paternal Grandmother     History  Substance Use Topics  . Smoking status: Former Smoker -- 0.25 packs/day    Types: Cigarettes  . Smokeless tobacco: Never Used     Comment: quit in April 2014  . Alcohol Use: No     Comment: occassionally    OB History   Grav Para Term Preterm Abortions TAB SAB Ect Mult Living   4 2 2  2 2    2      Review of Systems  Constitutional: Negative for fever.  HENT: Positive for sore throat.   Respiratory: Positive for cough, shortness of breath and stridor.   Cardiovascular: Positive for chest pain.  Gastrointestinal: Positive for vomiting. Negative for nausea, abdominal pain and diarrhea.  Musculoskeletal: Negative for myalgias.  Psychiatric/Behavioral: Negative for confusion.    Allergies  Latex  Home Medications   Current Outpatient Rx  Name  Route  Sig  Dispense  Refill  . albuterol (PROVENTIL HFA;VENTOLIN HFA) 108 (90 BASE) MCG/ACT inhaler   Inhalation   Inhale 2 puffs into the lungs every 6 (six) hours as needed for wheezing or shortness of breath.         . EPINEPHrine (EPI-PEN) 0.3 mg/0.3 mL SOAJ   Intramuscular   Inject 0.3 mg into the muscle once.          BP 125/74  Pulse 100  Temp(Src) 101.2 F (38.4 C)  Resp 24  Wt 200 lb (90.719 kg)  SpO2 99% Physical Exam  Nursing note and vitals reviewed. Constitutional: She appears  well-developed and well-nourished. No distress.  HENT:  Head: Normocephalic and atraumatic.  Mouth/Throat: Uvula is midline.  Neck: Neck supple.  Cardiovascular: Normal rate and regular rhythm.   Pulmonary/Chest: Effort normal and breath sounds normal. No respiratory distress. She has no wheezes. She has no rales. She exhibits tenderness.  Coughs on auscultation   Abdominal: Soft. She exhibits no distension. There is no tenderness. There is no rebound and no guarding.  Neurological: She is alert.  Skin: She is not diaphoretic.    ED Course  Procedures (including critical care time)  COORDINATION OF CARE:  Nursing  notes reviewed. Vital signs reviewed. Initial pt interview and examination performed.   3:01 PM-Discussed work up plan with pt at bedside, which includes CXR. Pt agrees with plan.   Treatment plan initiated: Medications  albuterol (PROVENTIL) (2.5 MG/3ML) 0.083% nebulizer solution 5 mg (not administered)  chlorpheniramine-HYDROcodone (TUSSIONEX) 10-8 MG/5ML suspension 5 mL (not administered)     Initial diagnostic testing ordered.    Labs Review Labs Reviewed - No data to display Imaging Review No results found.  EKG Interpretation   None       MDM   1. Viral respiratory illness    Febrile but nontoxic patient with constellation of symptoms suggestive of viral syndrome.  No concerning findings on exam.  CXR ordered given c/o chest pain and SOB.  Also given neb treatment.  Discussed patient with Alvina Chou, PA-C, who assumes care at change of shift pending CXR result.  Recommend d/c home with albuterol hfa, cough medication if negative.  Pt denies breastfeeding (lacation warnings given by the computer).     I personally performed the services described in this documentation, which was scribed in my presence. The recorded information has been reviewed and is accurate.    Clayton Bibles, PA-C 12/18/13 1644

## 2013-12-18 NOTE — ED Notes (Signed)
sorethroat cough and  Congestion and she has felt nauseous

## 2013-12-18 NOTE — Discharge Instructions (Signed)
You have a viral illness. Take hycodan as needed for cough. Refer to attached documents for more information. Return to the ED with worsening or concerning symptoms.

## 2013-12-19 NOTE — ED Provider Notes (Signed)
Medical screening examination/treatment/procedure(s) were performed by non-physician practitioner and as supervising physician I was immediately available for consultation/collaboration.  EKG Interpretation   None         Alfonzo Feller, DO 12/19/13 [phone]

## 2014-03-07 ENCOUNTER — Encounter (HOSPITAL_COMMUNITY): Payer: Self-pay | Admitting: Emergency Medicine

## 2014-03-07 ENCOUNTER — Emergency Department (HOSPITAL_COMMUNITY): Payer: Medicaid Other

## 2014-03-07 ENCOUNTER — Emergency Department (HOSPITAL_COMMUNITY)
Admission: EM | Admit: 2014-03-07 | Discharge: 2014-03-08 | Disposition: A | Discharge status: Home / Self Care | Payer: Medicaid Other | Attending: Emergency Medicine | Admitting: Emergency Medicine

## 2014-03-07 DIAGNOSIS — Z91040 Latex allergy status: Secondary | ICD-10-CM | POA: Insufficient documentation

## 2014-03-07 DIAGNOSIS — Y9389 Activity, other specified: Secondary | ICD-10-CM | POA: Insufficient documentation

## 2014-03-07 DIAGNOSIS — Y929 Unspecified place or not applicable: Secondary | ICD-10-CM | POA: Insufficient documentation

## 2014-03-07 DIAGNOSIS — K0889 Other specified disorders of teeth and supporting structures: Secondary | ICD-10-CM

## 2014-03-07 DIAGNOSIS — S0993XA Unspecified injury of face, initial encounter: Secondary | ICD-10-CM | POA: Insufficient documentation

## 2014-03-07 DIAGNOSIS — S199XXA Unspecified injury of neck, initial encounter: Principal | ICD-10-CM

## 2014-03-07 DIAGNOSIS — Z8781 Personal history of (healed) traumatic fracture: Secondary | ICD-10-CM | POA: Insufficient documentation

## 2014-03-07 DIAGNOSIS — Z87440 Personal history of urinary (tract) infections: Secondary | ICD-10-CM | POA: Insufficient documentation

## 2014-03-07 DIAGNOSIS — Z862 Personal history of diseases of the blood and blood-forming organs and certain disorders involving the immune mechanism: Secondary | ICD-10-CM | POA: Insufficient documentation

## 2014-03-07 DIAGNOSIS — Z79899 Other long term (current) drug therapy: Secondary | ICD-10-CM | POA: Insufficient documentation

## 2014-03-07 DIAGNOSIS — IMO0002 Reserved for concepts with insufficient information to code with codable children: Secondary | ICD-10-CM | POA: Insufficient documentation

## 2014-03-07 DIAGNOSIS — J45990 Exercise induced bronchospasm: Secondary | ICD-10-CM | POA: Insufficient documentation

## 2014-03-07 DIAGNOSIS — Z87891 Personal history of nicotine dependence: Secondary | ICD-10-CM | POA: Insufficient documentation

## 2014-03-07 DIAGNOSIS — J45909 Unspecified asthma, uncomplicated: Secondary | ICD-10-CM | POA: Insufficient documentation

## 2014-03-07 MED ORDER — HYDROCODONE-ACETAMINOPHEN 5-325 MG PO TABS
1.0000 | ORAL_TABLET | Freq: Once | ORAL | Status: AC
  Administered 2014-03-07: 1 via ORAL
  Filled 2014-03-07: qty 1
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
## 2014-03-08 IMAGING — CT CT MAXILLOFACIAL W/O CM
1 series · 16 of 30 positions shown, 20 images · non-contrast
Comparison: None.

CLINICAL DATA: Mouth injury

EXAM:
CT MAXILLOFACIAL WITHOUT CONTRAST
TECHNIQUE: Multidetector CT imaging of the maxillofacial structures was
performed. Multiplanar CT image reconstructions were also generated.
A small metallic BB was placed on the right temple in order to
reliably differentiate right from left.

[Series 3: facial st · axial · 0.33mm/px · z∈[-154,-22]mm · 16 of 72 slices shown, 20 images]
[im 3/72  brain]
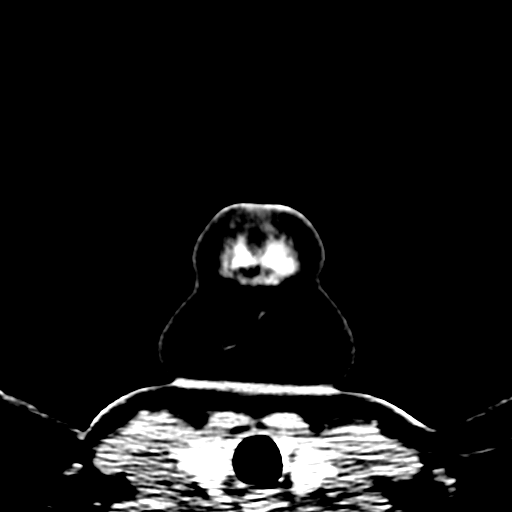
[im 3/72  bone]
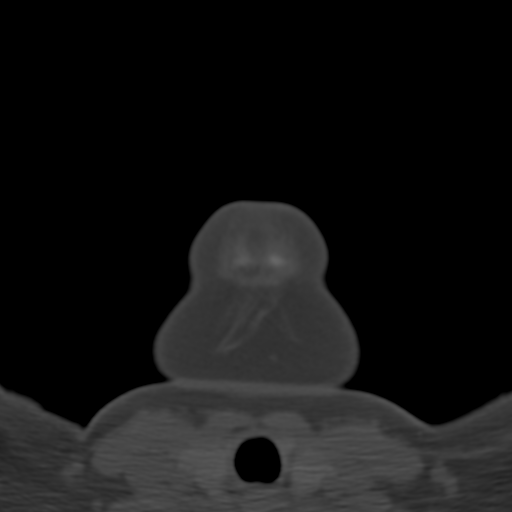
[im 8/72  bone]
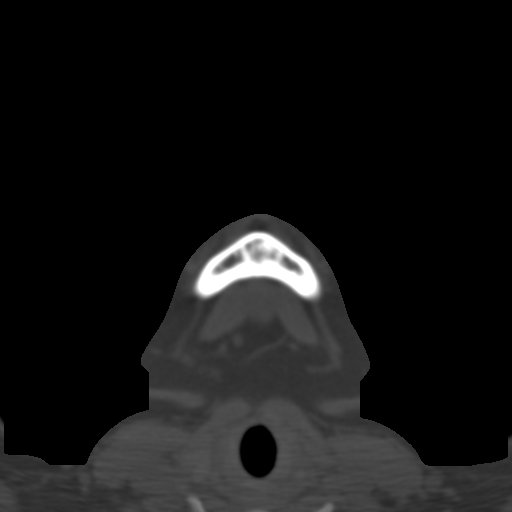
[im 13/72  bone]
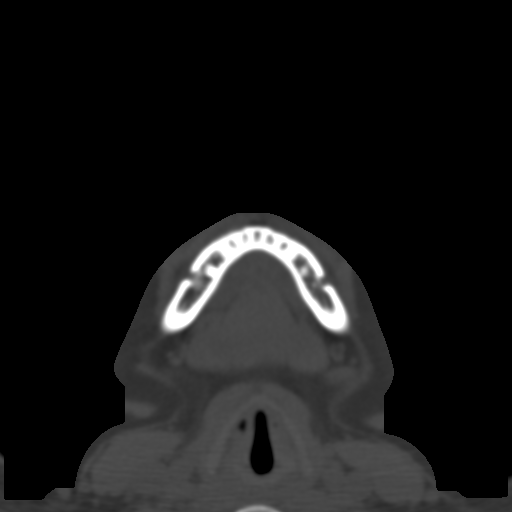
[im 18/72  bone]
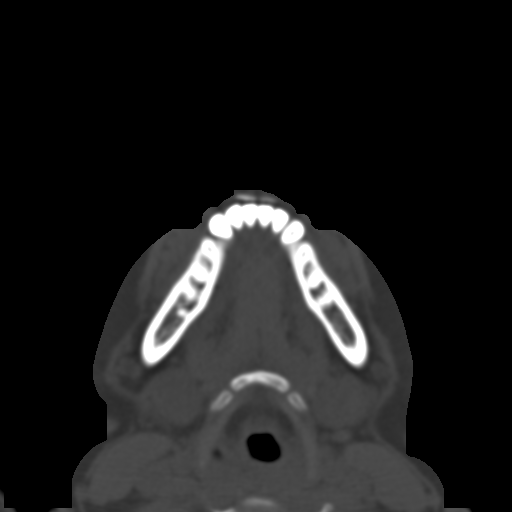
[im 20/72  brain]
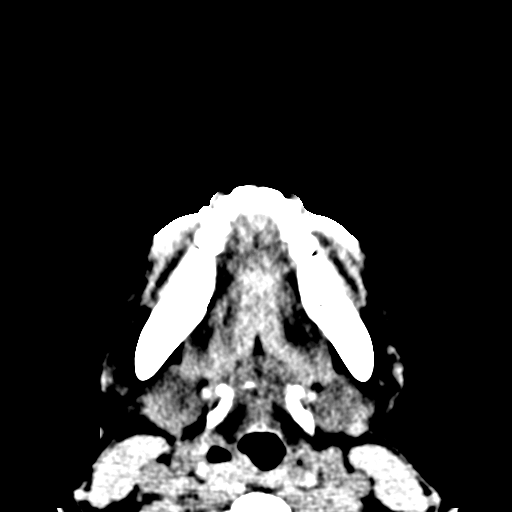
[im 20/72  bone]
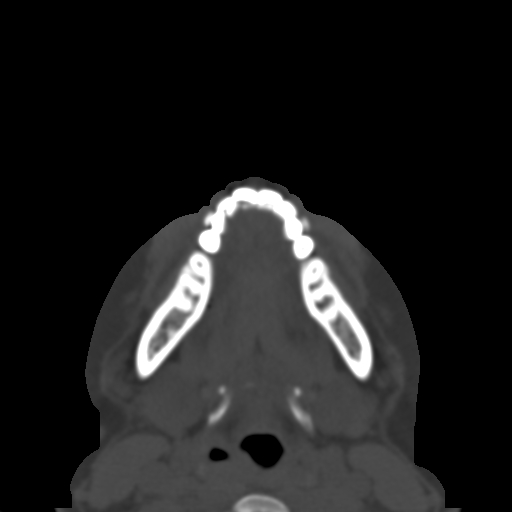
[im 25/72  bone]
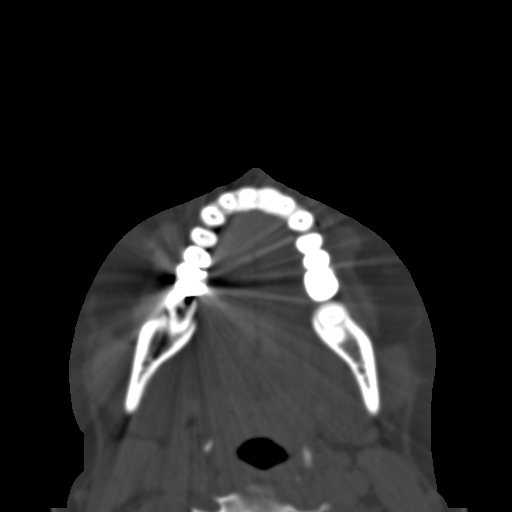
[im 30/72  bone]
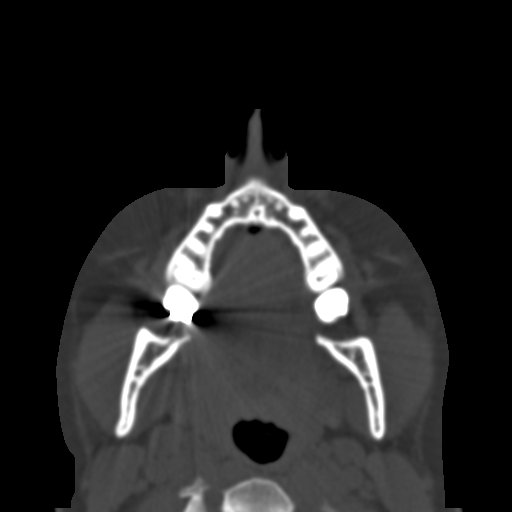
[im 35/72  bone]
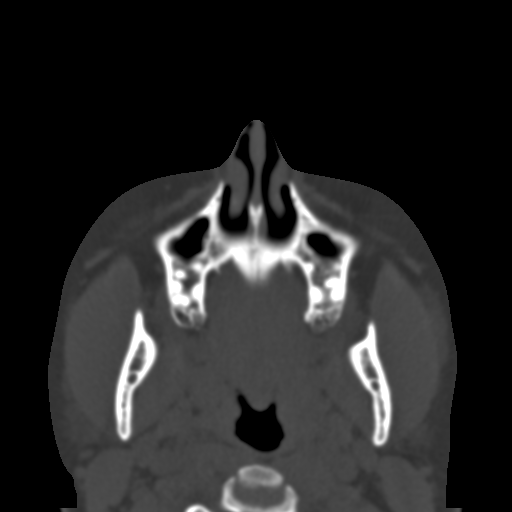
[im 37/72  brain]
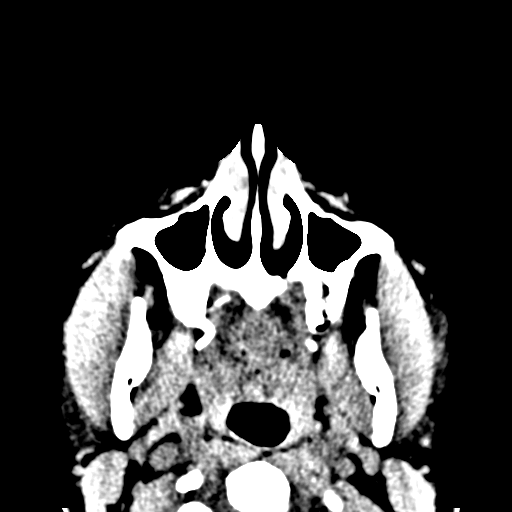
[im 37/72  bone]
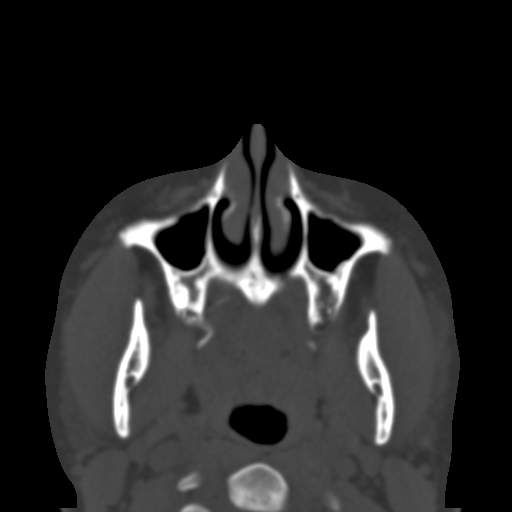
[im 42/72  bone]
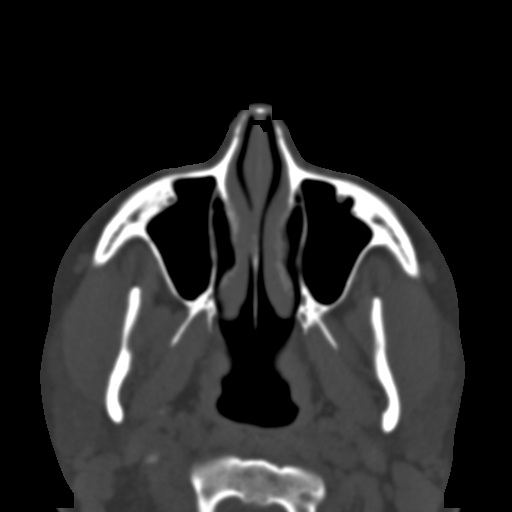
[im 47/72  bone]
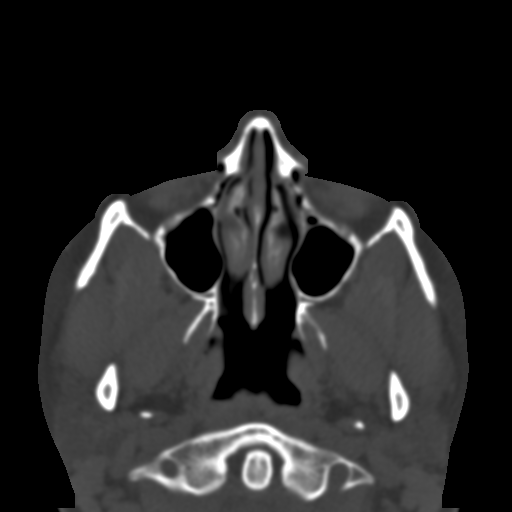
[im 52/72  bone]
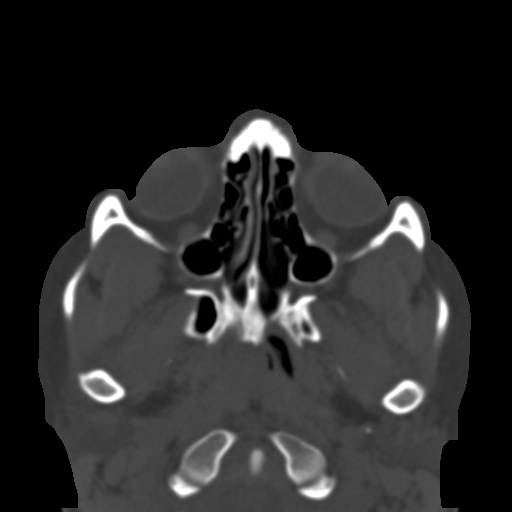
[im 54/72  brain]
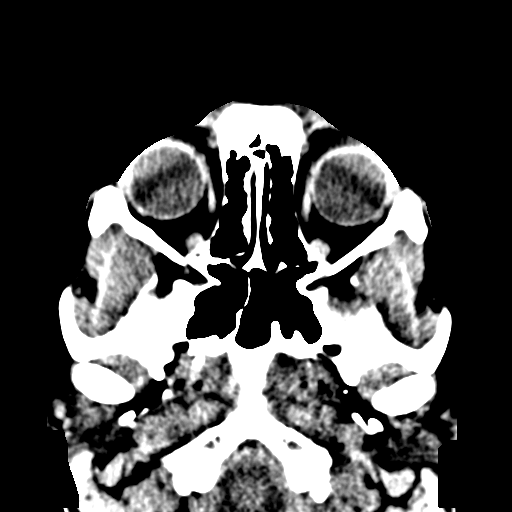
[im 54/72  bone]
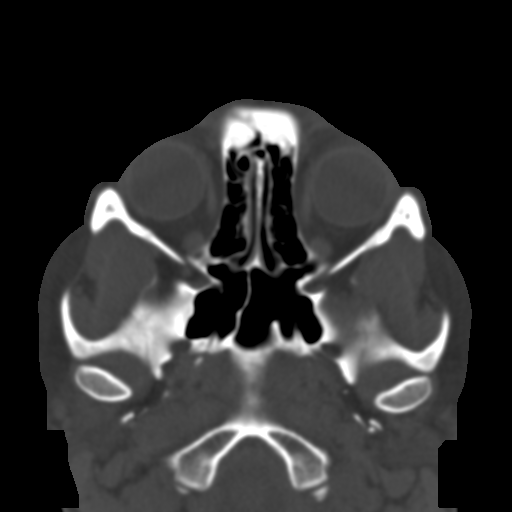
[im 59/72  bone]
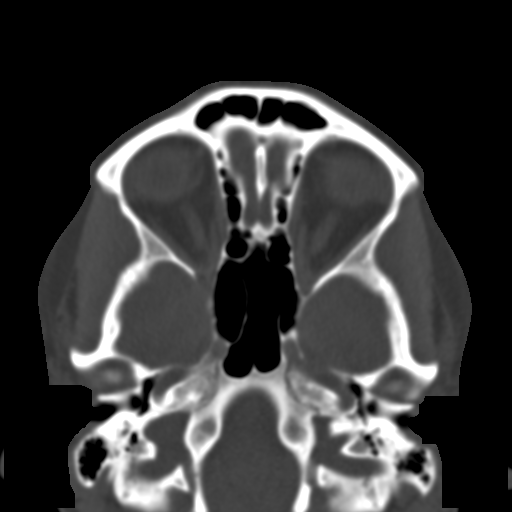
[im 64/72  bone]
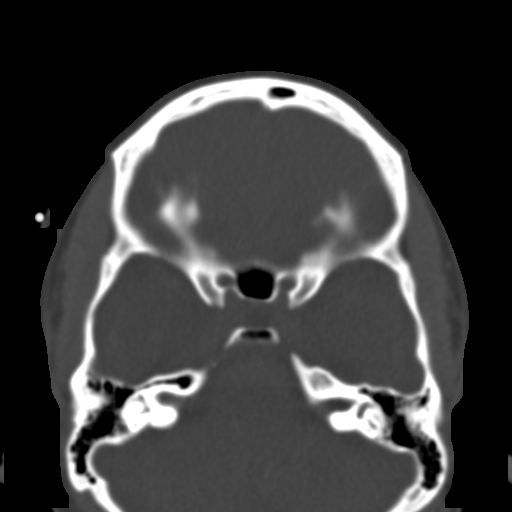
[im 69/72  bone]
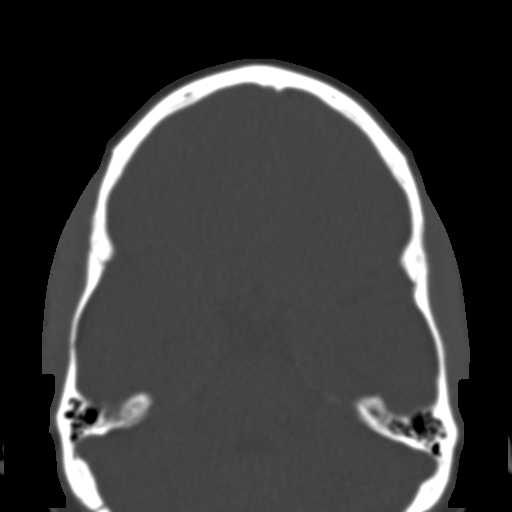

[16 of 30 positions shown; findings below may reference images not displayed]

FINDINGS: The globes are intact. The orbital walls are intact. The orbital
floors are intact. The maxilla is intact. The mandible is intact.
The zygomatic arches are intact. The nasal septum is midline. There
is no nasal bone fracture. The temporomandibular joints are normal.

The paranasal sinuses are clear. The visualized portions of the
mastoid sinuses are well aerated.
IMPRESSION: No acute osseous injury of the maxillofacial bones.

## 2014-03-08 MED ORDER — HYDROCODONE-ACETAMINOPHEN 5-325 MG PO TABS: 1.0000 | ORAL_TABLET | Freq: Four times a day (QID) | ORAL | Status: DC | PRN

## 2014-03-08 MED ORDER — IBUPROFEN 800 MG PO TABS: 800.0000 mg | ORAL_TABLET | Freq: Three times a day (TID) | ORAL | Status: DC | PRN
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
## 2014-03-08 NOTE — ED Provider Notes (Signed)
Medical screening examination/treatment/procedure(s) were performed by non-physician practitioner and as supervising physician I was immediately available for consultation/collaboration.   EKG Interpretation None        Osvaldo Shipper, MD 03/08/14 [phone]

## 2014-03-08 NOTE — Discharge Instructions (Signed)
Your CT scan was negative.  Return here as needed.  Followup with a primary care Dr.

## 2014-09-24 ENCOUNTER — Encounter (HOSPITAL_COMMUNITY): Payer: Self-pay | Admitting: Emergency Medicine

## 2014-10-14 ENCOUNTER — Emergency Department (HOSPITAL_COMMUNITY)
Admission: EM | Admit: 2014-10-14 | Discharge: 2014-10-15 | Disposition: A | Discharge status: Home / Self Care | Payer: Medicaid Other | Attending: Emergency Medicine | Admitting: Emergency Medicine

## 2014-10-14 ENCOUNTER — Encounter (HOSPITAL_COMMUNITY): Payer: Self-pay | Admitting: Emergency Medicine

## 2014-10-14 DIAGNOSIS — Z79899 Other long term (current) drug therapy: Secondary | ICD-10-CM | POA: Insufficient documentation

## 2014-10-14 DIAGNOSIS — Z862 Personal history of diseases of the blood and blood-forming organs and certain disorders involving the immune mechanism: Secondary | ICD-10-CM | POA: Insufficient documentation

## 2014-10-14 DIAGNOSIS — Z87891 Personal history of nicotine dependence: Secondary | ICD-10-CM | POA: Insufficient documentation

## 2014-10-14 DIAGNOSIS — Z8781 Personal history of (healed) traumatic fracture: Secondary | ICD-10-CM | POA: Insufficient documentation

## 2014-10-14 DIAGNOSIS — J45909 Unspecified asthma, uncomplicated: Secondary | ICD-10-CM | POA: Insufficient documentation

## 2014-10-14 DIAGNOSIS — Z87440 Personal history of urinary (tract) infections: Secondary | ICD-10-CM | POA: Insufficient documentation

## 2014-10-14 DIAGNOSIS — Z91040 Latex allergy status: Secondary | ICD-10-CM | POA: Insufficient documentation

## 2014-10-14 DIAGNOSIS — Z3202 Encounter for pregnancy test, result negative: Secondary | ICD-10-CM | POA: Insufficient documentation

## 2014-10-14 DIAGNOSIS — G43901 Migraine, unspecified, not intractable, with status migrainosus: Secondary | ICD-10-CM

## 2014-10-14 MED ORDER — KETOROLAC TROMETHAMINE 30 MG/ML IJ SOLN
30.0000 mg | Freq: Once | INTRAMUSCULAR | Status: AC
  Administered 2014-10-14: 30 mg via INTRAVENOUS
  Filled 2014-10-14: qty 1

## 2014-10-14 MED ORDER — SODIUM CHLORIDE 0.9 % IV BOLUS (SEPSIS)
1000.0000 mL | Freq: Once | INTRAVENOUS | Status: AC
  Administered 2014-10-14: 1000 mL via INTRAVENOUS

## 2014-10-14 MED ORDER — PROCHLORPERAZINE EDISYLATE 5 MG/ML IJ SOLN
10.0000 mg | Freq: Once | INTRAMUSCULAR | Status: AC
  Administered 2014-10-14: 10 mg via INTRAVENOUS
  Filled 2014-10-14: qty 2

## 2014-10-14 MED ORDER — DEXAMETHASONE SODIUM PHOSPHATE 10 MG/ML IJ SOLN
10.0000 mg | Freq: Once | INTRAMUSCULAR | Status: AC
  Administered 2014-10-14: 10 mg via INTRAVENOUS
  Filled 2014-10-14: qty 1

## 2014-10-14 MED ORDER — DIPHENHYDRAMINE HCL 50 MG/ML IJ SOLN
25.0000 mg | Freq: Once | INTRAMUSCULAR | Status: AC
  Administered 2014-10-14: 25 mg via INTRAVENOUS
  Filled 2014-10-14: qty 1

## 2014-10-14 NOTE — ED Provider Notes (Signed)
I saw and evaluated the patient, reviewed the resident's note and I agree with the findings and plan.   Typical migraine for patient with nausea and vomiting without focal neurologic symptoms.  Babette Relic, MD 10/18/14 [phone]

## 2014-10-14 NOTE — ED Provider Notes (Signed)
CSN: [id number]     Arrival date & time 10/14/14  2113 History   First MD Initiated Contact with Patient 10/14/14 2155     Chief Complaint  Patient presents with  . Migraine     (Consider location/radiation/quality/duration/timing/severity/associated sxs/prior Treatment) HPI Patient is a 23 year old female, otherwise healthy presenting with a migraine headache. She says that she has a generalized headache that began yesterday morning, and has gradually worsened. Maximal at onset, was not the worst headache of her life. She says this feels typical of her usual migraines. She denies any aura associated with it. She has no rushing sound in her ears, she has no vertigo, syncopal or near syncope. No chest pain or shortness of breath, no back pain, dysmetria, or tingling in any extremities.   Past Medical History  Diagnosis Date  . Seasonal allergies   . Environmental allergies   . Anemia   . Infection     UTI  . Wrist fracture, right   . Asthma     hx chronic bronchitis/exercise induced asthma as pre teen only   Past Surgical History  Procedure Laterality Date  . Cesarean section  2008  . Dilation and curettage of uterus  08/2011  . Induced abortion    . Cesarean section N/A 06/15/2013    Procedure: CESAREAN SECTION;  Surgeon: Allyn Kenner, DO;  Location: [facility];  Service: Obstetrics;  Laterality: N/A;   Family History  Problem Relation Age of Onset  . Hypertension Mother   . Cancer Father     liver  . Diabetes Father   . Hypertension Father   . Hypertension Maternal Grandmother   . Kidney disease Maternal Grandmother   . Diabetes Paternal Grandmother   . Hypertension Paternal Grandmother    History  Substance Use Topics  . Smoking status: Former Smoker -- 0.25 packs/day    Types: Cigarettes  . Smokeless tobacco: Never Used     Comment: quit in April 2014  . Alcohol Use: No     Comment: occassionally    OB History    Gravida Para Term Preterm AB TAB SAB Ectopic  Multiple Living   4 2 2  2 2    2      Review of Systems  Eyes: Positive for photophobia. Negative for pain, redness, itching and visual disturbance.  Neurological: Positive for headaches. Negative for dizziness, tremors, seizures, syncope, facial asymmetry, speech difficulty, weakness, light-headedness and numbness.  All other systems reviewed and are negative.     Allergies  Latex  Home Medications   Prior to Admission medications   Medication Sig Start Date End Date Taking? Authorizing Provider  acetaminophen (TYLENOL) 500 MG tablet Take 1,000 mg by mouth every 6 (six) hours as needed for headache.   Yes Historical Provider, MD  albuterol (PROVENTIL HFA;VENTOLIN HFA) 108 (90 BASE) MCG/ACT inhaler Inhale 2 puffs into the lungs every 6 (six) hours as needed for wheezing or shortness of breath (exercise induced asthma).    Yes Historical Provider, MD  EPINEPHrine (EPI-PEN) 0.3 mg/0.3 mL SOAJ Inject 0.3 mg into the muscle once.   Yes Historical Provider, MD  GARCINIA CAMBOGIA-CHROMIUM PO Take 2 tablets by mouth 3 (three) times daily.   Yes Historical Provider, MD  ibuprofen (ADVIL,MOTRIN) 800 MG tablet Take 1 tablet (800 mg total) by mouth every 8 (eight) hours as needed. Patient taking differently: Take 800 mg by mouth every 8 (eight) hours as needed for fever, headache or mild pain.  03/08/14  Yes Harrell Gave  W Lawyer, PA-C  Multiple Vitamins-Minerals (HAIR/SKIN/NAILS PO) Take 1 tablet by mouth daily.   Yes Historical Provider, MD  HYDROcodone-acetaminophen (NORCO/VICODIN) 5-325 MG per tablet Take 1 tablet by mouth every 6 (six) hours as needed for moderate pain. Patient not taking: Reported on 10/14/2014 03/08/14   Resa Miner Lawyer, PA-C   BP 109/67 mmHg  Pulse 75  Temp(Src) 98.4 F (36.9 C) (Oral)  Resp 16  Ht 5\' 7"  (1.702 m)  Wt 204 lb (92.534 kg)  BMI 31.94 kg/m2  SpO2 100%  LMP 09/24/2014 Physical Exam  Constitutional: She is oriented to person, place, and time. She  appears well-developed and well-nourished. No distress.  HENT:  Head: Normocephalic.  Right Ear: External ear normal.  Left Ear: External ear normal.  Eyes: EOM are normal. Pupils are equal, round, and reactive to light.  Neck: Normal range of motion. Neck supple. No JVD present.  Cardiovascular: Normal rate, regular rhythm, normal heart sounds and intact distal pulses.   Pulmonary/Chest: Effort normal and breath sounds normal. No respiratory distress.  Abdominal: Soft. She exhibits no distension.  Musculoskeletal: Normal range of motion. She exhibits no edema or tenderness.  Neurological: She is alert and oriented to person, place, and time.  Cranial nerves II through X intact Sensation to light touch and pinprick intact Normal gait Normal coordination Normal strength in bilateral lower and upper extremities  Skin: Skin is warm.  Psychiatric: She has a normal mood and affect.  Nursing note and vitals reviewed.   ED Course  Procedures (including critical care time) Labs Review Labs Reviewed  PREGNANCY, URINE    Imaging Review No results found.   EKG Interpretation None      MDM   Final diagnoses:  Migraine with status migrainosus, not intractable, unspecified migraine type   23 year old female with migraine headache.  Not the worst headache of her life, not maximal in intensity at onset, not sudden in onset, doubt subarachnoid hemorrhage. No meningismus, no fever or tachycardias to suggest meningitis. Completely normal neurologic exam, no concern for vascular anomalies.  Most likely diagnosis migraine headache.  Patient's pain improved with headache cocktail, stable for discharge with no further workup at this point.    Leata Mouse, MD 10/14/14 2351

## 2014-10-14 NOTE — ED Notes (Signed)
Pt. reports migraine headache onset yesterday with nausea, vomitting and photophobia . Denies fever or chills.

## 2014-10-14 NOTE — Discharge Instructions (Signed)
Recurrent Migraine Headache °A migraine headache is very bad, throbbing pain on one or both sides of your head. Recurrent migraines keep coming back. Talk to your doctor about what things may bring on (trigger) your migraine headaches. °HOME CARE °· Only take medicines as told by your doctor. °· Lie down in a dark, quiet room when you have a migraine. °· Keep a journal to find out if certain things bring on migraine headaches. For example, write down: °¨ What you eat and drink. °¨ How much sleep you get. °¨ Any change to your diet or medicines. °· Lessen how much alcohol you drink. °· Quit smoking if you smoke. °· Get enough sleep. °· Lessen any stress in your life. °· Keep lights dim if bright lights bother you or make your migraines worse. °GET HELP IF: °· Medicine does not help your migraines. °· Your pain keeps coming back. °· You have a fever. °GET HELP RIGHT AWAY IF:  °· Your migraine becomes really bad. °· You have a stiff neck. °· You have trouble seeing. °· Your muscles are weak, or you lose muscle control. °· You lose your balance or have trouble walking. °· You feel like you will pass out (faint), or you pass out. °· You have really bad symptoms that are different than your first symptoms. °MAKE SURE YOU:  °· Understand these instructions. °· Will watch your condition. °· Will get help right away if you are not doing well or get worse. °Document Released: 08/18/2008 Document Revised: 11/14/2013 Document Reviewed: 07/17/2013 °[employer]® Patient Information ©2015 [employer]. This information is not intended to replace advice given to you by your health care provider. Make sure you discuss any questions you have with your health care provider. ° °

## 2014-10-14 NOTE — ED Notes (Signed)
Pt reports HA that began yesterday evening approx 1900 - pt reports pain is progressively worse, n/v that began 0300 today and photophobia. Pt denies fever or neck pain.

## 2014-10-15 LAB — PREGNANCY, URINE: Preg Test, Ur: NEGATIVE

## 2014-10-15 NOTE — ED Notes (Signed)
Pt ambulating independently w/ steady gait on d/c in no acute distress, A&Ox4. Pt's mother to drive pt home.

## 2015-04-28 ENCOUNTER — Emergency Department (HOSPITAL_COMMUNITY)
Admission: EM | Admit: 2015-04-28 | Discharge: 2015-04-28 | Disposition: A | Discharge status: Home / Self Care | Payer: Medicaid Other | Attending: Emergency Medicine | Admitting: Emergency Medicine

## 2015-04-28 ENCOUNTER — Encounter (HOSPITAL_COMMUNITY): Payer: Self-pay | Admitting: Emergency Medicine

## 2015-04-28 DIAGNOSIS — X58XXXA Exposure to other specified factors, initial encounter: Secondary | ICD-10-CM | POA: Insufficient documentation

## 2015-04-28 DIAGNOSIS — J45909 Unspecified asthma, uncomplicated: Secondary | ICD-10-CM | POA: Insufficient documentation

## 2015-04-28 DIAGNOSIS — Z8781 Personal history of (healed) traumatic fracture: Secondary | ICD-10-CM | POA: Insufficient documentation

## 2015-04-28 DIAGNOSIS — Z91040 Latex allergy status: Secondary | ICD-10-CM | POA: Insufficient documentation

## 2015-04-28 DIAGNOSIS — Z87440 Personal history of urinary (tract) infections: Secondary | ICD-10-CM | POA: Insufficient documentation

## 2015-04-28 DIAGNOSIS — Z87891 Personal history of nicotine dependence: Secondary | ICD-10-CM | POA: Insufficient documentation

## 2015-04-28 DIAGNOSIS — M62838 Other muscle spasm: Secondary | ICD-10-CM

## 2015-04-28 DIAGNOSIS — Y998 Other external cause status: Secondary | ICD-10-CM | POA: Insufficient documentation

## 2015-04-28 DIAGNOSIS — Z862 Personal history of diseases of the blood and blood-forming organs and certain disorders involving the immune mechanism: Secondary | ICD-10-CM | POA: Insufficient documentation

## 2015-04-28 DIAGNOSIS — S199XXA Unspecified injury of neck, initial encounter: Secondary | ICD-10-CM | POA: Insufficient documentation

## 2015-04-28 DIAGNOSIS — Z79899 Other long term (current) drug therapy: Secondary | ICD-10-CM | POA: Insufficient documentation

## 2015-04-28 DIAGNOSIS — Y9289 Other specified places as the place of occurrence of the external cause: Secondary | ICD-10-CM | POA: Insufficient documentation

## 2015-04-28 DIAGNOSIS — Y9389 Activity, other specified: Secondary | ICD-10-CM | POA: Insufficient documentation

## 2015-04-28 MED ORDER — NAPROXEN 500 MG PO TABS: 500.0000 mg | ORAL_TABLET | Freq: Two times a day (BID) | ORAL | Status: DC

## 2015-04-28 MED ORDER — LIDOCAINE 5 % EX PTCH: 1.0000 | MEDICATED_PATCH | CUTANEOUS | Status: DC

## 2015-04-28 MED ORDER — CYCLOBENZAPRINE HCL 5 MG PO TABS: 5.0000 mg | ORAL_TABLET | Freq: Three times a day (TID) | ORAL | Status: DC | PRN

## 2015-04-28 MED ORDER — BUPIVACAINE HCL (PF) 0.5 % IJ SOLN
10.0000 mL | Freq: Once | INTRAMUSCULAR | Status: AC
  Administered 2015-04-28: 10 mL
  Filled 2015-04-28: qty 30
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
## 2015-04-28 NOTE — Discharge Instructions (Signed)
Muscle Cramps and Spasms Muscle cramps and spasms occur when a muscle or muscles tighten and you have no control over this tightening (involuntary muscle contraction). They are a common problem and can develop in any muscle. The most common place is in the calf muscles of the leg. Both muscle cramps and muscle spasms are involuntary muscle contractions, but they also have differences:   Muscle cramps are sporadic and painful. They may last a few seconds to a quarter of an hour. Muscle cramps are often more forceful and last longer than muscle spasms.  Muscle spasms may or may not be painful. They may also last just a few seconds or much longer. CAUSES  It is uncommon for cramps or spasms to be due to a serious underlying problem. In many cases, the cause of cramps or spasms is unknown. Some common causes are:   Overexertion.   Overuse from repetitive motions (doing the same thing over and over).   Remaining in a certain position for a long period of time.   Improper preparation, form, or technique while performing a sport or activity.   Dehydration.   Injury.   Side effects of some medicines.   Abnormally low levels of the salts and ions in your blood (electrolytes), especially potassium and calcium. This could happen if you are taking water pills (diuretics) or you are pregnant.  Some underlying medical problems can make it more likely to develop cramps or spasms. These include, but are not limited to:   Diabetes.   Parkinson disease.   Hormone disorders, such as thyroid problems.   Alcohol abuse.   Diseases specific to muscles, joints, and bones.   Blood vessel disease where not enough blood is getting to the muscles.  HOME CARE INSTRUCTIONS   Stay well hydrated. Drink enough water and fluids to keep your urine clear or pale yellow.  It may be helpful to massage, stretch, and relax the affected muscle.  For tight or tense muscles, use a warm towel, heating  pad, or hot shower water directed to the affected area.  If you are sore or have pain after a cramp or spasm, applying ice to the affected area may relieve discomfort.  Put ice in a plastic bag.  Place a towel between your skin and the bag.  Leave the ice on for 15-20 minutes, 03-04 times a day.  Medicines used to treat a known cause of cramps or spasms may help reduce their frequency or severity. Only take over-the-counter or prescription medicines as directed by your caregiver. SEEK MEDICAL CARE IF:  Your cramps or spasms get more severe, more frequent, or do not improve over time.  MAKE SURE YOU:   Understand these instructions.  Will watch your condition.  Will get help right away if you are not doing well or get worse. Document Released: 05/01/2002 Document Revised: 03/06/2013 Document Reviewed: 10/26/2012 [facility] Patient Information 2015 [place], [place]. This information is not intended to replace advice given to you by your health care provider. Make sure you discuss any questions you have with your health care provider. Trigger Point Injection Trigger points are areas where you have muscle pain. A trigger point injection is a shot given in the trigger point to relieve that pain. A trigger point might feel like a knot in your muscle. It hurts to press on a trigger point. Sometimes the pain spreads out (radiates) to other parts of the body. For example, pressing on a trigger point in your shoulder might  cause pain in your arm or neck. You might have one trigger point. Or, you might have more than one. People often have trigger points in their upper back and lower back. They also occur often in the neck and shoulders. Pain from a trigger point lasts for a long time. It can make it hard to keep moving. You might not be able to do the exercise or physical therapy that could help you deal with the pain. A trigger point injection may help. It does not work for everyone. But, it may relieve  your pain for a few days or a few months. A trigger point injection does not cure long-lasting (chronic) pain. LET YOUR CAREGIVER KNOW ABOUT:  Any allergies (especially to latex, lidocaine, or steroids).  Blood-thinning medicines that you take. These drugs can lead to bleeding or bruising after an injection. They include:  Aspirin.  Ibuprofen.  Clopidogrel.  Warfarin.  Other medicines you take. This includes all vitamins, herbs, eyedrops, over-the-counter medicines, and creams.  Use of steroids.  Recent infections.  Past problems with numbing medicines.  Bleeding problems.  Surgeries you have had.  Other health problems. RISKS AND COMPLICATIONS A trigger point injection is a safe treatment. However, problems may develop, such as:  Minor side effects usually go away in 1 to 2 days. These may include:  Soreness.  Bruising.  Stiffness.  More serious problems are rare. But, they may include:  Bleeding under the skin (hematoma).  Skin infection.  Breaking off of the needle under your skin.  Lung puncture.  The trigger point injection may not work for you. BEFORE THE PROCEDURE You may need to stop taking any medicine that thins your blood. This is to prevent bleeding and bruising. Usually these medicines are stopped several days before the injection. No other preparation is needed. PROCEDURE  A trigger point injection can be given in your caregiver's office or in a clinic. Each injection takes 2 minutes or less.  Your caregiver will feel for trigger points. The caregiver may use a marker to circle the area for the injection.  The skin over the trigger point will be washed with a germ-killing (antiseptic) solution.  The caregiver pinches the spot for the injection.  Then, a very thin needle is used for the shot. You may feel pain or a twitching feeling when the needle enters the trigger point.  A numbing solution may be injected into the trigger point.  Sometimes a drug to keep down swelling, redness, and warmth (inflammation) is also injected.  Your caregiver moves the needle around the trigger zone until the tightness and twitching goes away.  After the injection, your caregiver may put gentle pressure over the injection site.  Then it is covered with a bandage. AFTER THE PROCEDURE  You can go right home after the injection.  The bandage can be taken off after a few hours.  You may feel sore and stiff for 1 to 2 days.  Go back to your regular activities slowly. Your caregiver may ask you to stretch your muscles. Do not do anything that takes extra energy for a few days.  Follow your caregiver's instructions to manage and treat other pain. Document Released: 10/29/2011 Document Revised: 03/06/2013 Document Reviewed: 10/29/2011 [facility] Patient Information 2015 [place], [place]. This information is not intended to replace advice given to you by your health care provider. Make sure you discuss any questions you have with your health care provider. Heat Therapy Heat therapy can help ease sore,  stiff, injured, and tight muscles and joints. Heat relaxes your muscles, which may help ease your pain.  RISKS AND COMPLICATIONS If you have any of the following conditions, do not use heat therapy unless your health care provider has approved:  Poor circulation.  Healing wounds or scarred skin in the area being treated.  Diabetes, heart disease, or high blood pressure.  Not being able to feel (numbness) the area being treated.  Unusual swelling of the area being treated.  Active infections.  Blood clots.  Cancer.  Inability to communicate pain. This may include young children and people who have problems with their brain function (dementia).  Pregnancy. Heat therapy should only be used on old, pre-existing, or long-lasting (chronic) injuries. Do not use heat therapy on new injuries unless directed by your health care provider. HOW  TO USE HEAT THERAPY There are several different kinds of heat therapy, including:  Moist heat pack.  Warm water bath.  Hot water bottle.  Electric heating pad.  Heated gel pack.  Heated wrap.  Electric heating pad. Use the heat therapy method suggested by your health care provider. Follow your health care provider's instructions on when and how to use heat therapy. GENERAL HEAT THERAPY RECOMMENDATIONS  Do not sleep while using heat therapy. Only use heat therapy while you are awake.  Your skin may turn pink while using heat therapy. Do not use heat therapy if your skin turns red.  Do not use heat therapy if you have new pain.  High heat or long exposure to heat can cause burns. Be careful when using heat therapy to avoid burning your skin.  Do not use heat therapy on areas of your skin that are already irritated, such as with a rash or sunburn. SEEK MEDICAL CARE IF:  You have blisters, redness, swelling, or numbness.  You have new pain.  Your pain is worse. MAKE SURE YOU:  Understand these instructions.  Will watch your condition.  Will get help right away if you are not doing well or get worse. Document Released: 02/01/2012 Document Revised: 03/26/2014 Document Reviewed: 01/02/2014 [facility] Patient Information 2015 [place], [place]. This information is not intended to replace advice given to you by your health care provider. Make sure you discuss any questions you have with your health care provider.

## 2015-09-05 ENCOUNTER — Emergency Department (HOSPITAL_COMMUNITY)
Admission: EM | Admit: 2015-09-05 | Discharge: 2015-09-05 | Disposition: A | Discharge status: Home / Self Care | Payer: Medicaid Other | Source: Home / Self Care | Attending: Emergency Medicine | Admitting: Emergency Medicine

## 2015-09-05 ENCOUNTER — Encounter (HOSPITAL_COMMUNITY): Payer: Self-pay | Admitting: *Deleted

## 2015-09-05 DIAGNOSIS — S161XXA Strain of muscle, fascia and tendon at neck level, initial encounter: Secondary | ICD-10-CM | POA: Diagnosis not present

## 2015-09-05 DIAGNOSIS — W57XXXA Bitten or stung by nonvenomous insect and other nonvenomous arthropods, initial encounter: Secondary | ICD-10-CM | POA: Diagnosis not present

## 2015-09-05 DIAGNOSIS — S00461A Insect bite (nonvenomous) of right ear, initial encounter: Secondary | ICD-10-CM | POA: Diagnosis not present

## 2015-09-05 MED ORDER — TRIAMCINOLONE ACETONIDE 0.1 % EX CREA: TOPICAL_CREAM | CUTANEOUS | Status: DC

## 2015-09-05 MED ORDER — NAPROXEN 375 MG PO TABS: 375.0000 mg | ORAL_TABLET | Freq: Two times a day (BID) | ORAL | Status: DC

## 2015-09-05 MED ORDER — DICLOFENAC SODIUM 1 % TD GEL: 1.0000 "application " | Freq: Four times a day (QID) | TRANSDERMAL | Status: DC

## 2015-09-05 NOTE — Discharge Instructions (Signed)
Cervical Sprain A cervical sprain is when the tissues (ligaments) that hold the neck bones in place stretch or tear. HOME CARE   Put ice on the injured area.  Put ice in a plastic bag.  Place a towel between your skin and the bag.  Leave the ice on for 15-20 minutes, 3-4 times a day.  You may have been given a collar to wear. This collar keeps your neck from moving while you heal.  Do not take the collar off unless told by your doctor.  If you have long hair, keep it outside of the collar.  Ask your doctor before changing the position of your collar. You may need to change its position over time to make it more comfortable.  If you are allowed to take off the collar for cleaning or bathing, follow your doctor's instructions on how to do it safely.  Keep your collar clean by wiping it with mild soap and water. Dry it completely. If the collar has removable pads, remove them every 1-2 days to hand wash them with soap and water. Allow them to air dry. They should be dry before you wear them in the collar.  Do not drive while wearing the collar.  Only take medicine as told by your doctor.  Keep all doctor visits as told.  Keep all physical therapy visits as told.  Adjust your work station so that you have good posture while you work.  Avoid positions and activities that make your problems worse.  Warm up and stretch before being active. GET HELP IF:  Your pain is not controlled with medicine.  You cannot take less pain medicine over time as planned.  Your activity level does not improve as expected. GET HELP RIGHT AWAY IF:   You are bleeding.  Your stomach is upset.  You have an allergic reaction to your medicine.  You develop new problems that you cannot explain.  You lose feeling (become numb) or you cannot move any part of your body (paralysis).  You have tingling or weakness in any part of your body.  Your symptoms get worse. Symptoms include:  Pain,  soreness, stiffness, puffiness (swelling), or a burning feeling in your neck.  Pain when your neck is touched.  Shoulder or upper back pain.  Limited ability to move your neck.  Headache.  Dizziness.  Your hands or arms feel week, lose feeling, or tingle.  Muscle spasms.  Difficulty swallowing or chewing. MAKE SURE YOU:   Understand these instructions.  Will watch your condition.  Will get help right away if you are not doing well or get worse.   This information is not intended to replace advice given to you by your health care provider. Make sure you discuss any questions you have with your health care provider.   Document Released: 04/27/2008 Document Revised: 07/12/2013 Document Reviewed: 05/17/2013 Elsevier Interactive Patient Education 2016 [place], flies, fleas, bedbugs, and many other insects can bite. Insect bites are different from insect stings. A sting is when poison (venom) is injected into the skin. Insect bites can cause pain or itching for a few days, but they are usually not serious. Some insects can spread diseases to people through a bite. SYMPTOMS  Symptoms of an insect bite include:  Itching or pain in the bite area.  Redness and swelling in the bite area.  An open wound (skin ulcer). In many cases, symptoms last for 2-4 days.  DIAGNOSIS  This  condition is usually diagnosed based on symptoms and a physical exam. TREATMENT  Treatment is usually not needed for an insect bite. Symptoms often go away on their own. Your health care provider may recommend creams or lotions to help reduce itching. Antibiotic medicines may be prescribed if the bite becomes infected. A tetanus shot may be given in some cases. If you develop an allergic reaction to an insect bite, your health care provider will prescribe medicines to treat the reaction (antihistamines). This is rare. HOME CARE INSTRUCTIONS  Do not scratch the bite area.  Keep  the bite area clean and dry. Wash the bite area daily with soap and water as told by your health care provider.  If directed, applyice to the bite area.  Put ice in a plastic bag.  Place a towel between your skin and the bag.  Leave the ice on for 20 minutes, 2-3 times per day.  To help reduce itching and swelling, try applying a baking soda paste, cortisone cream, or calamine lotion to the bite area as told by your health care provider.  Apply or take over-the-counter and prescription medicines only as told by your health care provider.  If you were prescribed an antibiotic medicine, use it as told by your health care provider. Do not stop using the antibiotic even if your condition improves.  Keep all follow-up visits as told by your health care provider. This is important. PREVENTION   Use insect repellent. The best insect repellents contain:  DEET, picaridin, oil of lemon eucalyptus (OLE), or IR3535.  Higher amounts of an active ingredient.  When you are outdoors, wear clothing that covers your arms and legs.  Avoid opening windows that do not have window screens. SEEK MEDICAL CARE IF:  You have increased redness, swelling, or pain in the bite area.  You have a fever. SEEK IMMEDIATE MEDICAL CARE IF:   You have joint pain.   You have fluid, blood, or pus coming from the bite area.  You have a headache or neck pain.  You have unusual weakness.  You have a rash.  You have chest pain or shortness of breath.  You have abdominal pain, nausea, or vomiting.  You feel unusually tired or sleepy.   This information is not intended to replace advice given to you by your health care provider. Make sure you discuss any questions you have with your health care provider.   Document Released: 12/17/2004 Document Revised: 07/31/2015 Document Reviewed: 03/27/2015 Elsevier Interactive Patient Education 2016 [place].  Muscle Strain A muscle strain is an injury that  occurs when a muscle is stretched beyond its normal length. Usually a small number of muscle fibers are torn when this happens. Muscle strain is rated in degrees. First-degree strains have the least amount of muscle fiber tearing and pain. Second-degree and third-degree strains have increasingly more tearing and pain.  Usually, recovery from muscle strain takes 1-2 weeks. Complete healing takes 5-6 weeks.  CAUSES  Muscle strain happens when a sudden, violent force placed on a muscle stretches it too far. This may occur with lifting, sports, or a fall.  RISK FACTORS Muscle strain is especially common in athletes.  SIGNS AND SYMPTOMS At the site of the muscle strain, there may be:  Pain.  Bruising.  Swelling.  Difficulty using the muscle due to pain or lack of normal function. DIAGNOSIS  Your health care provider will perform a physical exam and ask about your medical history. TREATMENT  Often, the  best treatment for a muscle strain is resting, icing, and applying cold compresses to the injured area.  HOME CARE INSTRUCTIONS   Use the PRICE method of treatment to promote muscle healing during the first 2-3 days after your injury. The PRICE method involves:  Protecting the muscle from being injured again.  Restricting your activity and resting the injured body part.  Icing your injury. To do this, put ice in a plastic bag. Place a towel between your skin and the bag. Then, apply the ice and leave it on from 15-20 minutes each hour. After the third day, switch to moist heat packs.  Apply compression to the injured area with a splint or elastic bandage. Be careful not to wrap it too tightly. This may interfere with blood circulation or increase swelling.  Elevate the injured body part above the level of your heart as often as you can.  Only take over-the-counter or prescription medicines for pain, discomfort, or fever as directed by your health care provider.  Warming up prior to  exercise helps to prevent future muscle strains. SEEK MEDICAL CARE IF:   You have increasing pain or swelling in the injured area.  You have numbness, tingling, or a significant loss of strength in the injured area. MAKE SURE YOU:   Understand these instructions.  Will watch your condition.  Will get help right away if you are not doing well or get worse.   This information is not intended to replace advice given to you by your health care provider. Make sure you discuss any questions you have with your health care provider.   Document Released: 11/09/2005 Document Revised: 08/30/2013 Document Reviewed: 06/08/2013 Elsevier Interactive Patient Education [employer].

## 2015-09-05 NOTE — ED Notes (Signed)
Pt  Reports    Pain r   Side neck  /face since   Yesterday  Pt  Reports  This  Is  A  Recurring  Problem      Has  Had  In the  Past        denys  Any  specefic    Injury      Sitting     Upright on   The  Exam table  Speaking in  Complete  sentances

## 2015-09-05 NOTE — ED Provider Notes (Signed)
CSN: [id number]     Arrival date & time 09/05/15  1301 History   First MD Initiated Contact with Patient 09/05/15 1338     Chief Complaint  Patient presents with  . Neck Pain   (Consider location/radiation/quality/duration/timing/severity/associated sxs/prior Treatment) HPI Comments: 24 year old female states that she was at a wedding yesterday and felt that her right outer ear was itching and burning. This persisted through the day. She is also feeling pressure in the right side of her neck. This is exacerbated with rotation of the head as well as left and right lateral flexion. No known injury. No fall or movement that may have caused injury. Onset was gradual and insidious. Denies systemic symptoms.   Past Medical History  Diagnosis Date  . Seasonal allergies   . Environmental allergies   . Anemia   . Infection     UTI  . Wrist fracture, right   . Asthma     hx chronic bronchitis/exercise induced asthma as pre teen only   Past Surgical History  Procedure Laterality Date  . Cesarean section  2008  . Dilation and curettage of uterus  08/2011  . Induced abortion    . Cesarean section N/A 06/15/2013    Procedure: CESAREAN SECTION;  Surgeon: Allyn Kenner, DO;  Location: [facility];  Service: Obstetrics;  Laterality: N/A;   Family History  Problem Relation Age of Onset  . Hypertension Mother   . Cancer Father     liver  . Diabetes Father   . Hypertension Father   . Hypertension Maternal Grandmother   . Kidney disease Maternal Grandmother   . Diabetes Paternal Grandmother   . Hypertension Paternal Grandmother    Social History  Substance Use Topics  . Smoking status: Former Smoker -- 0.25 packs/day    Types: Cigarettes  . Smokeless tobacco: Never Used     Comment: quit in April 2014  . Alcohol Use: No     Comment: occassionally    OB History    Gravida Para Term Preterm AB TAB SAB Ectopic Multiple Living   4 2 2  2 2    2      Review of Systems  Constitutional:  Negative.   HENT: Negative for ear discharge, ear pain, postnasal drip, rhinorrhea and sore throat.   Respiratory: Negative.   Cardiovascular: Negative.   Gastrointestinal: Negative.   Musculoskeletal: Negative.   Neurological: Negative.   All other systems reviewed and are negative.   Allergies  Latex  Home Medications   Prior to Admission medications   Medication Sig Start Date End Date Taking? Authorizing Provider  acetaminophen (TYLENOL) 500 MG tablet Take 1,000 mg by mouth every 6 (six) hours as needed for headache.    Historical Provider, MD  albuterol (PROVENTIL HFA;VENTOLIN HFA) 108 (90 BASE) MCG/ACT inhaler Inhale 2 puffs into the lungs every 6 (six) hours as needed for wheezing or shortness of breath (exercise induced asthma).     Historical Provider, MD  cyclobenzaprine (FLEXERIL) 5 MG tablet Take 1 tablet (5 mg total) by mouth 3 (three) times daily as needed for muscle spasms. 04/28/15   Margarita Mail, PA-C  diclofenac sodium (VOLTAREN) 1 % GEL Apply 1 application topically 4 (four) times daily. 09/05/15   Janne Napoleon, NP  EPINEPHrine (EPI-PEN) 0.3 mg/0.3 mL SOAJ Inject 0.3 mg into the muscle once.    Historical Provider, MD  GARCINIA CAMBOGIA-CHROMIUM PO Take 2 tablets by mouth 3 (three) times daily.    Historical Provider, MD  ibuprofen (  ADVIL,MOTRIN) 800 MG tablet Take 1 tablet (800 mg total) by mouth every 8 (eight) hours as needed. Patient taking differently: Take 800 mg by mouth every 8 (eight) hours as needed for fever, headache or mild pain.  03/08/14   Christopher Lawyer, PA-C  lidocaine (LIDODERM) 5 % Place 1 patch onto the skin daily. Remove & Discard patch within 12 hours or as directed by MD 04/28/15   Margarita Mail, PA-C  Multiple Vitamins-Minerals (HAIR/SKIN/NAILS PO) Take 1 tablet by mouth daily.    Historical Provider, MD  naproxen (NAPROSYN) 375 MG tablet Take 1 tablet (375 mg total) by mouth 2 (two) times daily. 09/05/15   Janne Napoleon, NP  triamcinolone cream  (KENALOG) 0.1 % Apply to the right outer ear twice a day when necessary swelling and itching. 09/05/15   Janne Napoleon, NP   Meds Ordered and Administered this Visit  Medications - No data to display  BP 124/83 mmHg  Pulse 81  Temp(Src) 99.6 F (37.6 C) (Oral)  Resp 16  SpO2 99%  LMP 08/07/2015 No data found.   Physical Exam  Constitutional: She is oriented to person, place, and time. She appears well-developed and well-nourished. No distress.  HENT:  Head: Normocephalic and atraumatic.  Left Ear: External ear normal.  Mouth/Throat: Oropharynx is clear and moist.  Right outer ear with mild erythema and swelling over the helix. There is no erythema in the postauricular area. There is tenderness over the right scalene muscle and can be tracked inferiorly to the lower neck. Having the patient flex neck to the left increases tension and pain and right flexion here to shoulder is limited due to pain. There is no overlying swelling or discoloration. Splenius capitis is not tender or swollen. No tenderness or redness over the mastoid. Bilateral TMs are normal.  Eyes: Conjunctivae and EOM are normal.  Neck: Neck supple.  Cardiovascular: Normal rate.   Pulmonary/Chest: Effort normal. No respiratory distress.  Musculoskeletal: Normal range of motion. She exhibits tenderness.  Lymphadenopathy:    She has no cervical adenopathy.  Neurological: She is alert and oriented to person, place, and time. She exhibits normal muscle tone.  Skin: Skin is warm and dry.  Psychiatric: She has a normal mood and affect.  Nursing note and vitals reviewed.   ED Course  Procedures (including critical care time)  Labs Review Labs Reviewed - No data to display  Imaging Review No results found.   Visual Acuity Review  Right Eye Distance:   Left Eye Distance:   Bilateral Distance:    Right Eye Near:   Left Eye Near:    Bilateral Near:         MDM   1. Insect bite of ear region, right, initial  encounter   2. Cervical strain, acute, initial encounter    There appears to be 2 separate etiologies for discomfort. The helix of the right ear appears similar to that of an insect bite with mild swelling, erythema and itching. No cellulitis. The right scalene muscle is tender and can be tracked from the base of the right skull to the base of the neck. Having her stretch and utilize the involved muscle reproduces the pain. Heat to the muscle. Diclofenac gel to the muscle Triamcinolone cream to the right outer ear.    Janne Napoleon, NP 09/05/15 1404

## 2015-09-15 ENCOUNTER — Emergency Department (INDEPENDENT_AMBULATORY_CARE_PROVIDER_SITE_OTHER)
Admission: EM | Admit: 2015-09-15 | Discharge: 2015-09-15 | Disposition: A | Discharge status: Home / Self Care | Payer: Medicaid Other | Source: Home / Self Care | Attending: Emergency Medicine | Admitting: Emergency Medicine

## 2015-09-15 ENCOUNTER — Encounter (HOSPITAL_COMMUNITY): Payer: Self-pay | Admitting: Emergency Medicine

## 2015-09-15 DIAGNOSIS — J069 Acute upper respiratory infection, unspecified: Secondary | ICD-10-CM

## 2015-09-15 DIAGNOSIS — B9789 Other viral agents as the cause of diseases classified elsewhere: Principal | ICD-10-CM

## 2015-09-15 LAB — POCT RAPID STREP A: Streptococcus, Group A Screen (Direct): NEGATIVE

## 2015-09-15 MED ORDER — CETIRIZINE HCL 10 MG PO TABS: 10.0000 mg | ORAL_TABLET | Freq: Every day | ORAL | Status: DC

## 2015-09-15 MED ORDER — IPRATROPIUM BROMIDE 0.06 % NA SOLN: 2.0000 | Freq: Four times a day (QID) | NASAL | Status: DC

## 2015-09-15 NOTE — Discharge Instructions (Signed)
You have a virus. Please use Atrovent nasal spray 4 times a day. Take cetirizine daily for the next week. You can do salt water gargles or Chloraseptic spray to help with the sore throat. Follow-up as needed.

## 2015-09-15 NOTE — ED Notes (Signed)
C/o symptoms started yesterday: sore throat, chest soreness and congestion, non-productive cough.  Child having similar symptoms for 3 days

## 2015-09-15 NOTE — ED Provider Notes (Signed)
CSN: [id number]     Arrival date & time 09/15/15  1438 History   First MD Initiated Contact with Patient 09/15/15 1627     Chief Complaint  Patient presents with  . Sore Throat  . URI   (Consider location/radiation/quality/duration/timing/severity/associated sxs/prior Treatment) HPI  Michele Avila is a 24 y.o. female presenting with nasal congestion, sore throat and cough that began yesterday. Patient's two year old son has been sick with similar symptoms for 3 days. Patient states that her sore throat is worse at night and that she feels drainage at the back of her throat. Her cough is non-productive and without wheezing or SOB. Patient has mild improvement of symptoms with Nyquil. Patient with decreased appetite. Patient denies fever, ear pain, nausea, vomiting, diarrhea, constipation, dysuria, dizziness, or LOC. 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   Past Medical History  Diagnosis Date  . Seasonal allergies   . Environmental allergies   . Anemia   . Infection     UTI  . Wrist fracture, right   . Asthma     hx chronic bronchitis/exercise induced asthma as pre teen only   Past Surgical History  Procedure Laterality Date  . Cesarean section  2008  . Dilation and curettage of uterus  08/2011  . Induced abortion    . Cesarean section N/A 06/15/2013    Procedure: CESAREAN SECTION;  Surgeon: Allyn Kenner, DO;  Location: [facility];  Service: Obstetrics;  Laterality: N/A;   Family History  Problem Relation Age of Onset  . Hypertension Mother   . Cancer Father     liver  . Diabetes Father   . Hypertension Father   . Hypertension Maternal Grandmother   . Kidney disease Maternal Grandmother   . Diabetes Paternal Grandmother   . Hypertension Paternal Grandmother    Social History  Substance Use Topics  . Smoking status: Former Smoker -- 0.25 packs/day    Types: Cigarettes  . Smokeless tobacco: Never Used     Comment: quit in April 2014  . Alcohol Use:  No     Comment: occassionally    OB History    Gravida Para Term Preterm AB TAB SAB Ectopic Multiple Living   4 2 2  2 2    2      Review of Systems  Constitutional: Positive for activity change and appetite change. Negative for fever, chills and diaphoresis.  HENT: Positive for congestion, nosebleeds (a few days ago), rhinorrhea, sinus pressure and sore throat. Negative for ear pain.   Eyes: Negative for discharge and visual disturbance.  Respiratory: Positive for cough. Negative for chest tightness, shortness of breath and wheezing.   Cardiovascular: Negative for chest pain and palpitations.  Gastrointestinal: Negative for nausea, vomiting, abdominal pain, diarrhea and constipation.  Genitourinary: Negative for dysuria and hematuria.  Musculoskeletal: Positive for myalgias (felt achy all over yesterday). Negative for back pain.  Skin: Negative for color change, pallor, rash and wound.  Neurological: Negative for dizziness, syncope, light-headedness and headaches.  Hematological: Bruises/bleeds easily ("I have anemia").    Allergies  Latex  Home Medications   Prior to Admission medications   Medication Sig Start Date End Date Taking? Authorizing Provider  OVER THE COUNTER MEDICATION otc cold and cough syrup   Yes Historical Provider, MD  acetaminophen (TYLENOL) 500 MG tablet Take 1,000 mg by mouth every 6 (six) hours as needed for headache.    Historical Provider, MD  albuterol (PROVENTIL HFA;VENTOLIN HFA) 108 (  90 BASE) MCG/ACT inhaler Inhale 2 puffs into the lungs every 6 (six) hours as needed for wheezing or shortness of breath (exercise induced asthma).     Historical Provider, MD  cetirizine (ZYRTEC) 10 MG tablet Take 1 tablet (10 mg total) by mouth daily. 09/15/15   Melony Overly, MD  cyclobenzaprine (FLEXERIL) 5 MG tablet Take 1 tablet (5 mg total) by mouth 3 (three) times daily as needed for muscle spasms. 04/28/15   Margarita Mail, PA-C  diclofenac sodium (VOLTAREN) 1 % GEL  Apply 1 application topically 4 (four) times daily. 09/05/15   Janne Napoleon, NP  EPINEPHrine (EPI-PEN) 0.3 mg/0.3 mL SOAJ Inject 0.3 mg into the muscle once.    Historical Provider, MD  GARCINIA CAMBOGIA-CHROMIUM PO Take 2 tablets by mouth 3 (three) times daily.    Historical Provider, MD  ibuprofen (ADVIL,MOTRIN) 800 MG tablet Take 1 tablet (800 mg total) by mouth every 8 (eight) hours as needed. Patient taking differently: Take 800 mg by mouth every 8 (eight) hours as needed for fever, headache or mild pain.  03/08/14   Dalia Heading, PA-C  ipratropium (ATROVENT) 0.06 % nasal spray Place 2 sprays into both nostrils 4 (four) times daily. 09/15/15   Melony Overly, MD  lidocaine (LIDODERM) 5 % Place 1 patch onto the skin daily. Remove & Discard patch within 12 hours or as directed by MD 04/28/15   Margarita Mail, PA-C  Multiple Vitamins-Minerals (HAIR/SKIN/NAILS PO) Take 1 tablet by mouth daily.    Historical Provider, MD  naproxen (NAPROSYN) 375 MG tablet Take 1 tablet (375 mg total) by mouth 2 (two) times daily. 09/05/15   Janne Napoleon, NP  triamcinolone cream (KENALOG) 0.1 % Apply to the right outer ear twice a day when necessary swelling and itching. 09/05/15   Janne Napoleon, NP   Meds Ordered and Administered this Visit  Medications - No data to display  BP 109/74 mmHg  Pulse 80  Temp(Src) 98.1 F (36.7 C) (Oral)  Resp 16  SpO2 98%  LMP 09/15/2015 No data found.   Physical Exam  Constitutional: She is oriented to person, place, and time. She appears well-developed and well-nourished. No distress.  HENT:  Head: Normocephalic and atraumatic.  Right Ear: External ear normal.  Left Ear: External ear normal.  Mouth/Throat: No oropharyngeal exudate.  Clear drainage noted in posterior oropharynx.  Cobblestoning present.  Eyes: Conjunctivae are normal. Pupils are equal, round, and reactive to light.  Neck: Normal range of motion. Neck supple.  Cardiovascular: Normal rate, regular rhythm and  normal heart sounds.  Exam reveals no gallop and no friction rub.   No murmur heard. Pulmonary/Chest: Effort normal and breath sounds normal. She has no wheezes. She has no rales.  Lymphadenopathy:    She has no cervical adenopathy.  Neurological: She is alert and oriented to person, place, and time.  Skin: Skin is warm and dry. No rash noted. She is not diaphoretic.  Psychiatric: She has a normal mood and affect. Her behavior is normal. Judgment and thought content normal.    ED Course  Procedures (including critical care time)  Labs Review Labs Reviewed  CULTURE, GROUP A STREP  POCT RAPID STREP A    Imaging Review No results found.      MDM   1. Viral URI with cough    24 yo female presenting with nasal congestion, sore throat, and cough that began yesterday. 24 year old son with similar symptoms that began 3 days prior. Nasal  congestion noted on exam. TMs normal. No tonsillar exudate noted on exam and with negative rapid strep test. Lungs CTA bilaterally. Presentation consistent with viral URI. Rx Atrovent nasal spray and Zyrtec 10 mg daily for nasal congestion. May also use saline nasal spray for nasal congestion and honey mixed with water, salt water gargles, or chloraseptic spray for sore throat.  Patient voices no other complaints at this time. Patient to follow up with urgent care as needed.   Patient seen and examined with student. I reviewed and addended the note as necessary.   Melony Overly, MD 09/15/15 [phone]

## 2015-09-15 NOTE — ED Notes (Signed)
Patient and her child are being seen in the same treatment room

## 2015-09-17 LAB — CULTURE, GROUP A STREP

## 2015-09-17 NOTE — ED Notes (Signed)
Untreated positive strep reviewed w Dr Bridgett Larsson, who authorized amoxicillin 500 mg, PO, bid x 10 , qs. Called and discussed w patient, who requested call in to [facility]. Spoke directly w pharmacist

## 2015-11-22 ENCOUNTER — Encounter (HOSPITAL_COMMUNITY): Payer: Self-pay | Admitting: Emergency Medicine

## 2015-11-22 ENCOUNTER — Emergency Department (INDEPENDENT_AMBULATORY_CARE_PROVIDER_SITE_OTHER)
Admission: EM | Admit: 2015-11-22 | Discharge: 2015-11-22 | Disposition: A | Discharge status: Home / Self Care | Payer: Medicaid Other | Source: Home / Self Care | Attending: Family Medicine | Admitting: Family Medicine

## 2015-11-22 DIAGNOSIS — M2011 Hallux valgus (acquired), right foot: Secondary | ICD-10-CM

## 2015-11-22 DIAGNOSIS — M79671 Pain in right foot: Secondary | ICD-10-CM

## 2015-11-22 DIAGNOSIS — M2012 Hallux valgus (acquired), left foot: Secondary | ICD-10-CM

## 2015-11-22 DIAGNOSIS — M21611 Bunion of right foot: Secondary | ICD-10-CM

## 2015-11-22 DIAGNOSIS — M79672 Pain in left foot: Secondary | ICD-10-CM

## 2015-11-22 MED ORDER — DICLOFENAC POTASSIUM 50 MG PO TABS: 50.0000 mg | ORAL_TABLET | Freq: Three times a day (TID) | ORAL | Status: DC

## 2015-11-22 NOTE — ED Notes (Signed)
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

## 2015-11-22 NOTE — Discharge Instructions (Signed)
Bunion (Hallux Valgus) Ice Use a circular foam donut covering for protection Cotton between big and second toes, esp at night. Cataflam for pain A bony bump (protrusion) on the inside of the foot, at the base of the first toe, is called a bunion (hallux valgus). A bunion causes the first toe to angle toward the other toes. SYMPTOMS   A bony bump on the inside of the foot, causing an outward turning of the first toe. It may also overlap the second toe.  Thickening of the skin (callus) over the bony bump.  Fluid buildup under the callus. Fluid may become red, tender, and swollen (inflamed) with constant irritation or pressure.  Foot pain and stiffness. CAUSES  Many causes exist, including:  Inherited from your family (genetics).  Injury (trauma) forcing the first toe into a position in which it overlaps other toes.  Bunions are also associated with wearing shoes that have a narrow toe box (pointy shoes). RISK INCREASES WITH:  Family history of foot abnormalities, especially bunions.  Arthritis.  Narrow shoes, especially high heels. PREVENTION  Wear shoes with a wide toe box.  Avoid shoes with high heels.  Wear a small pad between the big toe and second toe.  Maintain proper conditioning:  Foot and ankle flexibility.  Muscle strength and endurance. PROGNOSIS  With proper treatment, bunions can typically be cured. Occasionally, surgery is required.  RELATED COMPLICATIONS   Infection of the bunion.  Arthritis of the first toe.  Risks of surgery, including infection, bleeding, injury to nerves (numb toe), recurrent bunion, overcorrection (toe points inward), arthritis of the big toe, big toe pointing upward, and bone not healing. TREATMENT  Treatment first consists of stopping the activities that aggravate the pain, taking pain medicines, and icing to reduce inflammation and pain. Wear shoes with a wide toe box. Shoes can be modified by a shoe repair person to relieve  pressure on the bunion, especially if you cannot find shoes with a wide enough toe box. You may also place a pad with the center cut out in your shoe, to reduce pressure on the bunion. Sometimes, an arch support (orthotic) may reduce pressure on the bunion and alleviate the symptoms. Stretching and strengthening exercises for the muscles of the foot may be useful. You may choose to wear a brace or pad at night to hold the big toe away from the second toe. If non-surgical treatments are not successful, surgery may be needed. Surgery involves removing the overgrown tissue and correcting the position of the first toe, by realigning the bones. Bunion surgery is typically performed on an outpatient basis, meaning you can go home the same day as surgery. The surgery may involve cutting the mid portion of the bone of the first toe, or just cutting and repairing (reconstructing) the ligaments and soft tissues around the first toe.  MEDICATION   If pain medicine is needed, nonsteroidal anti-inflammatory medicines, such as aspirin and ibuprofen, or other minor pain relievers, such as acetaminophen, are often recommended.  Do not take pain medicine for 7 days before surgery.  Prescription pain relievers are usually only prescribed after surgery. Use only as directed and only as much as you need.  Ointments applied to the skin may be helpful. HEAT AND COLD  Cold treatment (icing) relieves pain and reduces inflammation. Cold treatment should be applied for 10 to 15 minutes every 2 to 3 hours for inflammation and pain and immediately after any activity that aggravates your symptoms. Use ice packs  or an ice massage.  Heat treatment may be used prior to performing the stretching and strengthening activities prescribed by your caregiver, physical therapist, or athletic trainer. Use a heat pack or a warm soak. SEEK MEDICAL CARE IF:   Symptoms get worse or do not improve in 2 weeks, despite treatment.  After  surgery, you develop fever, increasing pain, redness, swelling, drainage of fluids, bleeding, or increasing warmth around the surgical area.  New, unexplained symptoms develop. (Drugs used in treatment may produce side effects.)   This information is not intended to replace advice given to you by your health care provider. Make sure you discuss any questions you have with your health care provider.   Document Released: 11/09/2005 Document Revised: 02/01/2012 Document Reviewed: 02/21/2009 Elsevier Interactive Patient Education 2016 [place] A bunionectomy is a surgical procedure to remove a bunion. A bunion is a visible bump of bone on the inside of your foot where your big toe meets the rest of your foot. A bunion can develop when pressure turns this bone (first metatarsal) toward the other toes. Shoes that are too tight are the most common cause of bunions. Bunions can also be caused by diseases, such as arthritis and polio. You may need a bunionectomy if your bunion is very large and painful or it affects your ability to walk. LET [facility] CARE PROVIDER KNOW ABOUT:  Any allergies you have.  All medicines you are taking, including vitamins, herbs, eye drops, creams, and over-the-counter medicines.  Previous problems you or members of your family have had with the use of anesthetics.  Any blood disorders you have.  Previous surgeries you have had.  Medical conditions you have. RISKS AND COMPLICATIONS  Generally, this is a safe procedure. However, problems may occur, including:  Infection.  Pain.  Nerve damage.  Bleeding or blood clots.  Reactions to medicines.  Numbness, stiffness, or arthritis in your toe.  Foot problems that continue even after the procedure. BEFORE THE PROCEDURE  Ask your health care provider about:  Changing or stopping your regular medicines. This is especially important if you are taking diabetes medicines or blood  thinners.  Taking medicines such as aspirin and ibuprofen. These medicines can thin your blood. Do not take these medicines before your procedure if your health care provider instructs you not to.  Do not drink alcohol before the procedure as directed by your health care provider.  Do not use tobacco products, including cigarettes, chewing tobacco, or electronic cigarettes, before the procedure as directed by your health care provider. If you need help quitting, ask your health care provider.  Ask your health care provider what kind of medicine you will be given during your procedure. A bunionectomy may be done using one of these:  A medicine that numbs the area (local anesthetic).  A medicine that makes you go to sleep (general anesthetic). If you will be given general anesthetic, do not eat or drink anything after midnight on the night before the procedure or as directed by your health care provider. PROCEDURE  An IV tube may be inserted into a vein.  You will be given local anesthetic or general anesthetic.  The surgeon will make a cut (incision) over the enlarged area at the first joint of the big toe. The surgeon will remove the bunion.  You may have more than one incision if any of the bones in your big toe need to be moved. A bone itself may need to be  cut.  Sometimes the tissues around the big toe may also need to be cut then tightened or loosened to reposition the toe.  Screws or other hardware may be used to keep your foot in thecorrect position.  The incision will be closed with stitches (sutures) and covered with adhesive strips or another type of bandage (dressing). AFTER THE PROCEDURE  You may spend some time in a recovery area.  Your blood pressure, heart rate, breathing rate, and blood oxygen level will be monitored often until the medicines you were given have worn off.   This information is not intended to replace advice given to you by your health care provider.  Make sure you discuss any questions you have with your health care provider.   Document Released: 10/23/2005 Document Revised: 07/31/2015 Document Reviewed: 06/27/2014 Elsevier Interactive Patient Education [employer].

## 2015-11-22 NOTE — ED Provider Notes (Signed)
CSN: [id number]     Arrival date & time 11/22/15  1825 History   First MD Initiated Contact with Patient 11/22/15 1935     Chief Complaint  Patient presents with  . Foot Pain   (Consider location/radiation/quality/duration/timing/severity/associated sxs/prior Treatment) HPI Comments: 24 year old female diagnosed with bunions on both great toes a couple years ago. She states the pain is gradually increasing over the past several weeks pain particularly over the past week. She has hallux varus bilaterally. Right greater than left. The head of the first metacarpal is prominent and there is overlying erythema and tenderness. Skin is intact. No signs of infection. No tenderness to the plantar aspect of the foot.   Past Medical History  Diagnosis Date  . Seasonal allergies   . Environmental allergies   . Anemia   . Infection     UTI  . Wrist fracture, right   . Asthma     hx chronic bronchitis/exercise induced asthma as pre teen only   Past Surgical History  Procedure Laterality Date  . Cesarean section  2008  . Dilation and curettage of uterus  08/2011  . Induced abortion    . Cesarean section N/A 06/15/2013    Procedure: CESAREAN SECTION;  Surgeon: Allyn Kenner, DO;  Location: [facility];  Service: Obstetrics;  Laterality: N/A;   Family History  Problem Relation Age of Onset  . Hypertension Mother   . Cancer Father     liver  . Diabetes Father   . Hypertension Father   . Hypertension Maternal Grandmother   . Kidney disease Maternal Grandmother   . Diabetes Paternal Grandmother   . Hypertension Paternal Grandmother    Social History  Substance Use Topics  . Smoking status: Former Smoker -- 0.25 packs/day    Types: Cigarettes  . Smokeless tobacco: Never Used     Comment: quit in April 2014  . Alcohol Use: No     Comment: occassionally    OB History    Gravida Para Term Preterm AB TAB SAB Ectopic Multiple Living   4 2 2  2 2    2      Review of Systems  Constitutional:  Positive for activity change. Negative for fever.  HENT: Negative.   Respiratory: Negative.   Musculoskeletal: Positive for joint swelling. Negative for back pain and neck pain.  Skin: Positive for color change.  Neurological: Negative.   Psychiatric/Behavioral: Negative.     Allergies  Latex  Home Medications   Prior to Admission medications   Medication Sig Start Date End Date Taking? Authorizing Provider  acetaminophen (TYLENOL) 500 MG tablet Take 1,000 mg by mouth every 6 (six) hours as needed for headache.    Historical Provider, MD  albuterol (PROVENTIL HFA;VENTOLIN HFA) 108 (90 BASE) MCG/ACT inhaler Inhale 2 puffs into the lungs every 6 (six) hours as needed for wheezing or shortness of breath (exercise induced asthma).     Historical Provider, MD  cetirizine (ZYRTEC) 10 MG tablet Take 1 tablet (10 mg total) by mouth daily. 09/15/15   Melony Overly, MD  cyclobenzaprine (FLEXERIL) 5 MG tablet Take 1 tablet (5 mg total) by mouth 3 (three) times daily as needed for muscle spasms. 04/28/15   Margarita Mail, PA-C  diclofenac (CATAFLAM) 50 MG tablet Take 1 tablet (50 mg total) by mouth 3 (three) times daily. One tablet TID with food prn pain. 11/22/15   Janne Napoleon, NP  diclofenac sodium (VOLTAREN) 1 % GEL Apply 1 application topically 4 (four) times  daily. 09/05/15   Janne Napoleon, NP  EPINEPHrine (EPI-PEN) 0.3 mg/0.3 mL SOAJ Inject 0.3 mg into the muscle once.    Historical Provider, MD  GARCINIA CAMBOGIA-CHROMIUM PO Take 2 tablets by mouth 3 (three) times daily.    Historical Provider, MD  ibuprofen (ADVIL,MOTRIN) 800 MG tablet Take 1 tablet (800 mg total) by mouth every 8 (eight) hours as needed. Patient taking differently: Take 800 mg by mouth every 8 (eight) hours as needed for fever, headache or mild pain.  03/08/14   Dalia Heading, PA-C  ipratropium (ATROVENT) 0.06 % nasal spray Place 2 sprays into both nostrils 4 (four) times daily. 09/15/15   Melony Overly, MD  lidocaine (LIDODERM)  5 % Place 1 patch onto the skin daily. Remove & Discard patch within 12 hours or as directed by MD 04/28/15   Margarita Mail, PA-C  Multiple Vitamins-Minerals (HAIR/SKIN/NAILS PO) Take 1 tablet by mouth daily.    Historical Provider, MD  naproxen (NAPROSYN) 375 MG tablet Take 1 tablet (375 mg total) by mouth 2 (two) times daily. 09/05/15   Janne Napoleon, NP  OVER THE COUNTER MEDICATION otc cold and cough syrup    Historical Provider, MD  triamcinolone cream (KENALOG) 0.1 % Apply to the right outer ear twice a day when necessary swelling and itching. 09/05/15   Janne Napoleon, NP   Meds Ordered and Administered this Visit  Medications - No data to display  BP 116/78 mmHg  Pulse 81  Temp(Src) 97.9 F (36.6 C) (Oral)  Resp 16  SpO2 100%  LMP 11/22/2015 No data found.   Physical Exam  Constitutional: She is oriented to person, place, and time. She appears well-developed and well-nourished. No distress.  Musculoskeletal:  Medial deviation of the right great toe. First metatarsal head is prominent, tender and erythematous. There is a blister or vesicle to the dorsum of the second toe due to pressure from the great toe.  Neurological: She is alert and oriented to person, place, and time. She exhibits normal muscle tone.  Skin: Skin is warm and dry.  Psychiatric: She has a normal mood and affect.  Nursing note and vitals reviewed.   ED Course  Procedures (including critical care time)  Labs Review Labs Reviewed - No data to display  Imaging Review No results found.   Visual Acuity Review  Right Eye Distance:   Left Eye Distance:   Bilateral Distance:    Right Eye Near:   Left Eye Near:    Bilateral Near:         MDM   1. Bunion of great toe of right foot   2. Bunion of great toe, left   3. Foot pain, bilateral   4. Hallux valgus, acquired, bilateral     Ice Use a circular foam donut covering for protection Cotton between big and second toes, esp at night. Cataflam for  pain See podiatrist ASAP   Janne Napoleon, NP 11/22/15 2016

## 2015-11-24 HISTORY — PX: FOOT SURGERY: SHX648

## 2015-11-26 NOTE — ED Notes (Signed)
Patient called , left message that her insurance will not cover diclofinac for pain. Called [facility] to verify , and the insurance requires prior authorization for this Rx. Discussed w Kara Mead, MD, who instructs patient is to [name] OTC motrin or tylenol , as we do not offer prior approval for Rx. Called and discussed w patient, and advised to be sure and keep her appointment w MD on 12-02-15

## 2015-12-02 ENCOUNTER — Encounter: Payer: Self-pay | Admitting: Sports Medicine

## 2015-12-02 ENCOUNTER — Ambulatory Visit (INDEPENDENT_AMBULATORY_CARE_PROVIDER_SITE_OTHER): Payer: Medicaid Other | Admitting: Sports Medicine

## 2015-12-02 ENCOUNTER — Ambulatory Visit (INDEPENDENT_AMBULATORY_CARE_PROVIDER_SITE_OTHER): Payer: Medicaid Other

## 2015-12-02 ENCOUNTER — Ambulatory Visit: Payer: Medicaid Other | Admitting: Sports Medicine

## 2015-12-02 DIAGNOSIS — M216X9 Other acquired deformities of unspecified foot: Secondary | ICD-10-CM | POA: Diagnosis not present

## 2015-12-02 DIAGNOSIS — M79671 Pain in right foot: Secondary | ICD-10-CM

## 2015-12-02 DIAGNOSIS — M21619 Bunion of unspecified foot: Secondary | ICD-10-CM

## 2015-12-02 DIAGNOSIS — M79672 Pain in left foot: Secondary | ICD-10-CM

## 2015-12-02 NOTE — Patient Instructions (Addendum)
Bunion (Hallux Valgus) A bony bump (protrusion) on the inside of the foot, at the base of the first toe, is called a bunion (hallux valgus). A bunion causes the first toe to angle toward the other toes. SYMPTOMS   A bony bump on the inside of the foot, causing an outward turning of the first toe. It may also overlap the second toe.  Thickening of the skin (callus) over the bony bump.  Fluid buildup under the callus. Fluid may become red, tender, and swollen (inflamed) with constant irritation or pressure.  Foot pain and stiffness. CAUSES  Many causes exist, including:  Inherited from your family (genetics).  Injury (trauma) forcing the first toe into a position in which it overlaps other toes.  Bunions are also associated with wearing shoes that have a narrow toe box (pointy shoes). RISK INCREASES WITH:  Family history of foot abnormalities, especially bunions.  Arthritis.  Narrow shoes, especially high heels. PREVENTION  Wear shoes with a wide toe box.  Avoid shoes with high heels.  Wear a small pad between the big toe and second toe.  Maintain proper conditioning:  Foot and ankle flexibility.  Muscle strength and endurance. PROGNOSIS  With proper treatment, bunions can typically be cured. Occasionally, surgery is required.  RELATED COMPLICATIONS   Infection of the bunion.  Arthritis of the first toe.  Risks of surgery, including infection, bleeding, injury to nerves (numb toe), recurrent bunion, overcorrection (toe points inward), arthritis of the big toe, big toe pointing upward, and bone not healing. TREATMENT  Treatment first consists of stopping the activities that aggravate the pain, taking pain medicines, and icing to reduce inflammation and pain. Wear shoes with a wide toe box. Shoes can be modified by a shoe repair person to relieve pressure on the bunion, especially if you cannot find shoes with a wide enough toe box. You may also place a pad with the  center cut out in your shoe, to reduce pressure on the bunion. Sometimes, an arch support (orthotic) may reduce pressure on the bunion and alleviate the symptoms. Stretching and strengthening exercises for the muscles of the foot may be useful. You may choose to wear a brace or pad at night to hold the big toe away from the second toe. If non-surgical treatments are not successful, surgery may be needed. Surgery involves removing the overgrown tissue and correcting the position of the first toe, by realigning the bones. Bunion surgery is typically performed on an outpatient basis, meaning you can go home the same day as surgery. The surgery may involve cutting the mid portion of the bone of the first toe, or just cutting and repairing (reconstructing) the ligaments and soft tissues around the first toe.  MEDICATION   If pain medicine is needed, nonsteroidal anti-inflammatory medicines, such as aspirin and ibuprofen, or other minor pain relievers, such as acetaminophen, are often recommended.  Do not take pain medicine for 7 days before surgery.  Prescription pain relievers are usually only prescribed after surgery. Use only as directed and only as much as you need.  Ointments applied to the skin may be helpful. HEAT AND COLD  Cold treatment (icing) relieves pain and reduces inflammation. Cold treatment should be applied for 10 to 15 minutes every 2 to 3 hours for inflammation and pain and immediately after any activity that aggravates your symptoms. Use ice packs or an ice massage.  Heat treatment may be used prior to performing the stretching and strengthening activities prescribed by your   caregiver, physical therapist, or athletic trainer. Use a heat pack or a warm soak. SEEK MEDICAL CARE IF:   Symptoms get worse or do not improve in 2 weeks, despite treatment.  After surgery, you develop fever, increasing pain, redness, swelling, drainage of fluids, bleeding, or increasing warmth around the  surgical area.  New, unexplained symptoms develop. (Drugs used in treatment may produce side effects.)   This information is not intended to replace advice given to you by your health care provider. Make sure you discuss any questions you have with your health care provider.   Document Released: 11/09/2005 Document Revised: 02/01/2012 Document Reviewed: 02/21/2009 Elsevier Interactive Patient Education 2016 [place] Instructions  Congratulations, you have decided to take an important step to improving your quality of life.  You can be assured that the doctors of [facility] will be with you every step of the way.  Plan to be at the surgery center/hospital at least 1 (one) hour prior to your scheduled time unless otherwise directed by the surgical center/hospital staff.  You must have a responsible adult accompany you, remain during the surgery and drive you home.  Make sure you have directions to the surgical center/hospital and know how to get there on time. For hospital based surgery you will need to obtain a history and physical form from your family physician within 1 month prior to the date of surgery- we will give you a form for you primary physician.  We make every effort to accommodate the date you request for surgery.  There are however, times where surgery dates or times have to be moved.  We will contact you as soon as possible if a change in schedule is required.   No Aspirin/Ibuprofen for one week before surgery.  If you are on aspirin, any non-steroidal anti-inflammatory medications (Mobic, Aleve, Ibuprofen) you should stop taking it 7 days prior to your surgery.  You make take Tylenol  For pain prior to surgery.  Medications- If you are taking daily heart and blood pressure medications, seizure, reflux, allergy, asthma, anxiety, pain or diabetes medications, make sure the surgery center/hospital is aware before the day of surgery so they may notify you which  medications to take or avoid the day of surgery. No food or drink after midnight the night before surgery unless directed otherwise by surgical center/hospital staff. No alcoholic beverages 24 hours prior to surgery.  No smoking 24 hours prior to or 24 hours after surgery. Wear loose pants or shorts- loose enough to fit over bandages, boots, and casts. No slip on shoes, sneakers are best. Bring your boot with you to the surgery center/hospital.  Also bring crutches or a walker if your physician has prescribed it for you.  If you do not have this equipment, it will be provided for you after surgery. If you have not been contracted by the surgery center/hospital by the day before your surgery, call to confirm the date and time of your surgery. Leave-time from work may vary depending on the type of surgery you have.  Appropriate arrangements should be made prior to surgery with your employer. Prescriptions will be provided immediately following surgery by your doctor.  Have these filled as soon as possible after surgery and take the medication as directed. Remove nail polish on the operative foot. Wash the night before surgery.  The night before surgery wash the foot and leg well with the antibacterial soap provided and water paying special attention to beneath the  toenails and in between the toes.  Rinse thoroughly with water and dry well with a towel.  Perform this wash unless told not to do so by your physician.  Enclosed: 1 Ice pack (please put in freezer the night before surgery)   1 Hibiclens skin cleaner   Pre-op Instructions  If you have any questions regarding the instructions, do not hesitate to call our office.  [facility]: [place], [place] [address] [place]: [address], [place], [place] [address] [phone]  [facility] [place]: [address], [place] [address] [phone]  Dr. Kendell Bane DPM, Dr. Ila Mcgill DPM Dr. Harriet Masson DPM, Dr. Lanelle Bal DPM, Dr. Trudie Buckler DPM

## 2015-12-02 NOTE — Progress Notes (Addendum)
Patient ID: Michele Avila, female   DOB: 09/22/1991, 25 y.o.   MRN: [id number] Subjective: Michele Avila is a 25 y.o. female patient who presents to office for evaluation of Right> Left bunion pain. Patient complains of progressive pain especially over the last year in the Right>Left foot that starts as pain over the bump with direct pressure and range of motion; patient now has difficulty fitting shoes comfortably. 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  Patient has also tried changing shoes, splints, and pads with no improvement. Denies any other pedal complaints.  There are no active problems to display for this patient.  Current Outpatient Prescriptions on File Prior to Visit  Medication Sig Dispense Refill  . acetaminophen (TYLENOL) 500 MG tablet Take 1,000 mg by mouth every 6 (six) hours as needed for headache.    . albuterol (PROVENTIL HFA;VENTOLIN HFA) 108 (90 BASE) MCG/ACT inhaler Inhale 2 puffs into the lungs every 6 (six) hours as needed for wheezing or shortness of breath (exercise induced asthma).     . cetirizine (ZYRTEC) 10 MG tablet Take 1 tablet (10 mg total) by mouth daily. 30 tablet 0  . cyclobenzaprine (FLEXERIL) 5 MG tablet Take 1 tablet (5 mg total) by mouth 3 (three) times daily as needed for muscle spasms. 30 tablet 0  . diclofenac (CATAFLAM) 50 MG tablet Take 1 tablet (50 mg total) by mouth 3 (three) times daily. One tablet TID with food prn pain. 21 tablet 0  . diclofenac sodium (VOLTAREN) 1 % GEL Apply 1 application topically 4 (four) times daily. 100 g 0  . EPINEPHrine (EPI-PEN) 0.3 mg/0.3 mL SOAJ Inject 0.3 mg into the muscle once.    Marland Kitchen GARCINIA CAMBOGIA-CHROMIUM PO Take 2 tablets by mouth 3 (three) times daily.    Marland Kitchen ibuprofen (ADVIL,MOTRIN) 800 MG tablet Take 1 tablet (800 mg total) by mouth every 8 (eight) hours as needed. (Patient taking differently: Take 800 mg by mouth every 8 (eight) hours as needed for fever,  headache or mild pain. ) 21 tablet 0  . ipratropium (ATROVENT) 0.06 % nasal spray Place 2 sprays into both nostrils 4 (four) times daily. 15 mL 0  . lidocaine (LIDODERM) 5 % Place 1 patch onto the skin daily. Remove & Discard patch within 12 hours or as directed by MD 30 patch 0  . Multiple Vitamins-Minerals (HAIR/SKIN/NAILS PO) Take 1 tablet by mouth daily.    . naproxen (NAPROSYN) 375 MG tablet Take 1 tablet (375 mg total) by mouth 2 (two) times daily. 20 tablet 0  . OVER THE COUNTER MEDICATION otc cold and cough syrup    . triamcinolone cream (KENALOG) 0.1 % Apply to the right outer ear twice a day when necessary swelling and itching. 15 g 0   No current facility-administered medications on file prior to visit.   Allergies  Allergen Reactions  . Latex Hives and Rash   Social History   Social History  . Marital Status: Single    Spouse Name: N/A  . Number of Children: N/A  . Years of Education: N/A   Occupational History  . Not on file.   Social History Main Topics  . Smoking status: Former Smoker -- 0.25 packs/day    Types: Cigarettes  . Smokeless tobacco: Never Used     Comment: quit in April 2014  . Alcohol Use: No     Comment: occassionally   . Drug Use: Yes    Special: Marijuana  Comment: A Week ago was the last time she used.   Marland Kitchen Sexual Activity: Yes   Other Topics Concern  . Not on file   Social History Narrative   Past Surgical History  Procedure Laterality Date  . Cesarean section  2008  . Dilation and curettage of uterus  08/2011  . Induced abortion    . Cesarean section N/A 06/15/2013    Procedure: CESAREAN SECTION;  Surgeon: Allyn Kenner, DO;  Location: [facility];  Service: Obstetrics;  Laterality: N/A;    Objective:  General: Alert and oriented x3 in no acute distress  Dermatology: No open lesions bilateral lower extremities, no webspace macerations, no ecchymosis bilateral, all nails x 10 are well manicured.  Vascular: Dorsalis Pedis and  Posterior Tibial pedal pulses 2/4, Capillary Fill Time 3 seconds, (+) pedal hair growth bilateral, no edema bilateral lower extremities, Temperature gradient within normal limits.  Neurology: Gross sensation intact via light touch bilateral.  Musculoskeletal: Mild tenderness with palpation right>left bunion deformity, no limitation or crepitus with range of motion, deformity reducible, tracking not trackbound, there is minimal 1st ray hypermobility noted bilateral. Midtarsal, Subtalar joint, and ankle joint range of motion is within normal limits. On weightbearing exam, rearfoot rectus, forefoot slight abduction with HAV deformity supported on ground with no second toe crossover deformity noted.    Xrays  Right/Left Foot:    Impression: Normal osseous mineralization, Intermetatarsal angle above normal limits supportive of bunion, no other acute pathology.        Assessment and Plan: Problem List Items Addressed This Visit    None    Visit Diagnoses    Bunion    -  Primary    R>L    Relevant Orders    DG Foot 2 Views Left    DG Foot 2 Views Right    Foot pain, bilateral           -Complete examination performed -Xrays reviewed -Discussed treatement options; discussed HAV deformity;conservative and  Surgical management; risks, benefits, alternatives discussed. All patient's questions answered. -Patient opt for surgical correction of bunion. Consent obtain for Right Liane Comber buionectomy with screw fixation; will plan long dorsal arm [place] due to patient's functional demands instead of a proximal procedure at this time. Pre and Post op course explained. Surgical booking slip submitted and provided patient with Surgical packet and info for [facility]. -Dispensed CAM Walker to use post op -Recommend continue with good supportive shoes, inserts, and icing as needed in the meantime.  -Patient to return to office after surgery or sooner if condition worsens.  Landis Martins, DPM

## 2015-12-05 DIAGNOSIS — M2021 Hallux rigidus, right foot: Secondary | ICD-10-CM | POA: Diagnosis not present

## 2015-12-09 ENCOUNTER — Encounter: Payer: Self-pay | Admitting: Sports Medicine

## 2015-12-10 ENCOUNTER — Telehealth: Payer: Self-pay | Admitting: Sports Medicine

## 2015-12-10 NOTE — Telephone Encounter (Signed)
Post op phone call made to patient. Phone was answered by grandmother who states that she is glad I called because Michele Avila has had a terrible night. Patient informed me that her mother called the answering service and talked with Dr. Milinda Pointer who advised her to double up on pain medication. Patient states that around 3am the feeling started to come back in her leg and she had a lot of pain because she fell asleep and forgot to take her pain meds. I advised patient to stay ahead of the pain and to take meds as Rx, ice 20 mins of each hour while awake, and elevate to assist with pain control. Patient denies any other constitutional symptoms or complaints at this time.  -Dr. Cannon Kettle

## 2015-12-16 ENCOUNTER — Ambulatory Visit (INDEPENDENT_AMBULATORY_CARE_PROVIDER_SITE_OTHER): Payer: Medicaid Other

## 2015-12-16 ENCOUNTER — Encounter: Payer: Self-pay | Admitting: Sports Medicine

## 2015-12-16 ENCOUNTER — Ambulatory Visit (INDEPENDENT_AMBULATORY_CARE_PROVIDER_SITE_OTHER): Payer: Medicaid Other | Admitting: Sports Medicine

## 2015-12-16 DIAGNOSIS — M216X9 Other acquired deformities of unspecified foot: Secondary | ICD-10-CM | POA: Diagnosis not present

## 2015-12-16 DIAGNOSIS — M79671 Pain in right foot: Secondary | ICD-10-CM

## 2015-12-16 DIAGNOSIS — Z9889 Other specified postprocedural states: Secondary | ICD-10-CM

## 2015-12-16 MED ORDER — OXYCODONE-ACETAMINOPHEN 7.5-325 MG PO TABS: 1.0000 | ORAL_TABLET | Freq: Four times a day (QID) | ORAL | Status: DC | PRN

## 2015-12-16 NOTE — Progress Notes (Signed)
Patient ID: Michele Avila, female   DOB: [birth year], 25 y.o.   MRN: [id number] Subjective: Michele Avila is a 25 y.o. female patient seen today in office for POV #1 (DOS 12-09-2015), S/P Right dorsal long arm [place] Bunionectomy. Patient has a little pain at surgical site, denies calf pain, denies headache, chest pain, shortness of breath, nausea, vomiting, fever, or chills. Patient states that she is doing well and is only taking Ibuprofen and Percocet as needed. No other issues noted.   There are no active problems to display for this patient.  Current Outpatient Prescriptions on File Prior to Visit  Medication Sig Dispense Refill  . acetaminophen (TYLENOL) 500 MG tablet Take 1,000 mg by mouth every 6 (six) hours as needed for headache.    . albuterol (PROVENTIL HFA;VENTOLIN HFA) 108 (90 BASE) MCG/ACT inhaler Inhale 2 puffs into the lungs every 6 (six) hours as needed for wheezing or shortness of breath (exercise induced asthma).     . cetirizine (ZYRTEC) 10 MG tablet Take 1 tablet (10 mg total) by mouth daily. 30 tablet 0  . cyclobenzaprine (FLEXERIL) 5 MG tablet Take 1 tablet (5 mg total) by mouth 3 (three) times daily as needed for muscle spasms. 30 tablet 0  . diclofenac (CATAFLAM) 50 MG tablet Take 1 tablet (50 mg total) by mouth 3 (three) times daily. One tablet TID with food prn pain. 21 tablet 0  . diclofenac sodium (VOLTAREN) 1 % GEL Apply 1 application topically 4 (four) times daily. 100 g 0  . EPINEPHrine (EPI-PEN) 0.3 mg/0.3 mL SOAJ Inject 0.3 mg into the muscle once.    Marland Kitchen GARCINIA CAMBOGIA-CHROMIUM PO Take 2 tablets by mouth 3 (three) times daily.    Marland Kitchen ibuprofen (ADVIL,MOTRIN) 800 MG tablet Take 1 tablet (800 mg total) by mouth every 8 (eight) hours as needed. (Patient taking differently: Take 800 mg by mouth every 8 (eight) hours as needed for fever, headache or mild pain. ) 21 tablet 0  . ipratropium (ATROVENT) 0.06 % nasal spray Place 2 sprays into both nostrils 4 (four) times daily.  15 mL 0  . lidocaine (LIDODERM) 5 % Place 1 patch onto the skin daily. Remove & Discard patch within 12 hours or as directed by MD 30 patch 0  . Multiple Vitamins-Minerals (HAIR/SKIN/NAILS PO) Take 1 tablet by mouth daily.    . naproxen (NAPROSYN) 375 MG tablet Take 1 tablet (375 mg total) by mouth 2 (two) times daily. 20 tablet 0  . OVER THE COUNTER MEDICATION otc cold and cough syrup    . triamcinolone cream (KENALOG) 0.1 % Apply to the right outer ear twice a day when necessary swelling and itching. 15 g 0   No current facility-administered medications on file prior to visit.   Allergies  Allergen Reactions  . Latex Hives and Rash   Objective: General: No acute distress, AAOx3  Right foot: Sutures intact with no gapping or dehiscence at surgical site, mild swelling to forefoot on right foot, no erythema, no warmth, no drainage, no signs of infection noted, Capillary fill time <3 seconds in all digits, gross sensation present via light touch to right foot. No pain or crepitation with range of motion right foot; mild guarding consistent with post op status.  No pain with calf compression.   Post Op Xray, 3 views, Right foot: 1st metatarsal in Excellent alignment and position. Osteotomy site healing. Hardware intact at distal 1st metatarsal head. Soft tissue swelling within normal limits for post op status.  Assessment and Plan:  Problem List Items Addressed This Visit    None    Visit Diagnoses    Right foot pain    -  Primary    Relevant Medications    oxyCODONE-acetaminophen (PERCOCET) 7.5-325 MG tablet    Other Relevant Orders    DG Foot Complete Right    Status post right foot surgery        Long dorsal arm [place], DOS 12-09-15, healing well without complication    Relevant Medications    oxyCODONE-acetaminophen (PERCOCET) 7.5-325 MG tablet       -Patient seen and evaluated -Applied dry sterile dressing to surgical site right foot secured with stockinet   -Advised patient to make sure to keep dressings clean, dry, and intact to right surgical site -Advised patient to continue with CAM boot to right foot at all times when attempting to ambulate -Advised patient to limit activity to necessity  -Advised patient to ice and elevate as necessary  -Refilled Percocet 7.5/325mg  and cont with Motrin as needed for pain -Will plan for suture removal at next office visit. In the meantime, patient to call office if any issues or problems arise.   Landis Martins, DPM

## 2015-12-23 ENCOUNTER — Ambulatory Visit (INDEPENDENT_AMBULATORY_CARE_PROVIDER_SITE_OTHER): Payer: Medicaid Other | Admitting: Sports Medicine

## 2015-12-23 ENCOUNTER — Encounter: Payer: Self-pay | Admitting: Sports Medicine

## 2015-12-23 VITALS — BP 103/62 | HR 74 | Resp 17

## 2015-12-23 DIAGNOSIS — Z9889 Other specified postprocedural states: Secondary | ICD-10-CM | POA: Diagnosis not present

## 2015-12-23 DIAGNOSIS — M79671 Pain in right foot: Secondary | ICD-10-CM

## 2015-12-23 MED ORDER — OXYCODONE-ACETAMINOPHEN 5-325 MG PO TABS
1.0000 | ORAL_TABLET | Freq: Four times a day (QID) | ORAL | Status: DC | PRN
Start: 2015-12-23 — End: 2016-01-03

## 2015-12-23 NOTE — Progress Notes (Signed)
Patient ID: Michele Avila, female   DOB: Apr 16, 1991, 25 y.o.   MRN: [id number]  Subjective: Michele Avila is a 25 y.o. female patient seen today in office for POV #2 (DOS 12-09-2015), S/P Right dorsal long arm [place] Bunionectomy. Patient has a little pain at surgical site, denies calf pain, denies headache, chest pain, shortness of breath, nausea, vomiting, fever, or chills. Patient states that she is doing well and is only taking Ibuprofen and Percocet as needed q8h. No other issues noted.   There are no active problems to display for this patient.  Current Outpatient Prescriptions on File Prior to Visit  Medication Sig Dispense Refill  . acetaminophen (TYLENOL) 500 MG tablet Take 1,000 mg by mouth every 6 (six) hours as needed for headache.    . albuterol (PROVENTIL HFA;VENTOLIN HFA) 108 (90 BASE) MCG/ACT inhaler Inhale 2 puffs into the lungs every 6 (six) hours as needed for wheezing or shortness of breath (exercise induced asthma).     . cetirizine (ZYRTEC) 10 MG tablet Take 1 tablet (10 mg total) by mouth daily. 30 tablet 0  . cyclobenzaprine (FLEXERIL) 5 MG tablet Take 1 tablet (5 mg total) by mouth 3 (three) times daily as needed for muscle spasms. 30 tablet 0  . diclofenac (CATAFLAM) 50 MG tablet Take 1 tablet (50 mg total) by mouth 3 (three) times daily. One tablet TID with food prn pain. 21 tablet 0  . diclofenac sodium (VOLTAREN) 1 % GEL Apply 1 application topically 4 (four) times daily. 100 g 0  . EPINEPHrine (EPI-PEN) 0.3 mg/0.3 mL SOAJ Inject 0.3 mg into the muscle once.    Marland Kitchen GARCINIA CAMBOGIA-CHROMIUM PO Take 2 tablets by mouth 3 (three) times daily.    Marland Kitchen ibuprofen (ADVIL,MOTRIN) 800 MG tablet Take 1 tablet (800 mg total) by mouth every 8 (eight) hours as needed. (Patient taking differently: Take 800 mg by mouth every 8 (eight) hours as needed for fever, headache or mild pain. ) 21 tablet 0  . ipratropium (ATROVENT) 0.06 % nasal spray Place 2 sprays into both nostrils 4 (four) times  daily. 15 mL 0  . lidocaine (LIDODERM) 5 % Place 1 patch onto the skin daily. Remove & Discard patch within 12 hours or as directed by MD 30 patch 0  . Multiple Vitamins-Minerals (HAIR/SKIN/NAILS PO) Take 1 tablet by mouth daily.    . naproxen (NAPROSYN) 375 MG tablet Take 1 tablet (375 mg total) by mouth 2 (two) times daily. 20 tablet 0  . OVER THE COUNTER MEDICATION otc cold and cough syrup    . triamcinolone cream (KENALOG) 0.1 % Apply to the right outer ear twice a day when necessary swelling and itching. 15 g 0   No current facility-administered medications on file prior to visit.   Allergies  Allergen Reactions  . Latex Hives and Rash   Objective: General: No acute distress, AAOx3  Right foot: Sutures intact with no gapping or dehiscence at surgical site, mild swelling to forefoot on right foot, no erythema, no warmth, no drainage, no signs of infection noted, Capillary fill time <3 seconds in all digits, gross sensation present via light touch to right foot. No pain or crepitation with range of motion right foot; mild guarding consistent with post op status.  No pain with calf compression.   Assessment and Plan:  Problem List Items Addressed This Visit    None    Visit Diagnoses    Status post right foot surgery    -  Primary  [place] Bunionectomy 12-09-15     Relevant Medications    oxyCODONE-acetaminophen (ROXICET) 5-325 MG tablet    Right foot pain        Relevant Medications    oxyCODONE-acetaminophen (ROXICET) 5-325 MG tablet       -Patient seen and evaluated -Sutures removed and applied steri-strips to incision site; Applied dry sterile dressing to surgical site right foot secured with ACE and stockinet  -Advised patient may shower as normal and replace ACE daily to assist with edema control -Advised patient to continue with CAM boot to right foot at all times when attempting to ambulate for the next week and transition to post op shoe  -Advised patient to limit  activity to necessity  -Advised patient to ice and elevate as necessary  -Rx given for Percocet 5/325mg  and cont with Motrin as needed for pain -Patient to return in 2 weeks for follow up office visit. In the meantime, patient to call office if any issues or problems arise.   Landis Martins, DPM

## 2016-01-03 ENCOUNTER — Telehealth: Payer: Self-pay | Admitting: *Deleted

## 2016-01-03 DIAGNOSIS — Z9889 Other specified postprocedural states: Secondary | ICD-10-CM

## 2016-01-03 DIAGNOSIS — M79671 Pain in right foot: Secondary | ICD-10-CM

## 2016-01-03 MED ORDER — OXYCODONE-ACETAMINOPHEN 5-325 MG PO TABS: 1.0000 | ORAL_TABLET | Freq: Four times a day (QID) | ORAL | Status: DC | PRN

## 2016-01-03 NOTE — Telephone Encounter (Signed)
Pt states has an appt 01/05/2106, but her foot is killing her, request refill Percocet.  Dr. Cannon Kettle ordered refill as previously.  Informed pt she would need to pick up the rx in the [place] office.

## 2016-01-06 ENCOUNTER — Encounter: Payer: Self-pay | Admitting: Sports Medicine

## 2016-01-06 ENCOUNTER — Ambulatory Visit (INDEPENDENT_AMBULATORY_CARE_PROVIDER_SITE_OTHER): Payer: Medicaid Other | Admitting: Sports Medicine

## 2016-01-06 ENCOUNTER — Ambulatory Visit: Payer: Self-pay

## 2016-01-06 DIAGNOSIS — Z9889 Other specified postprocedural states: Secondary | ICD-10-CM

## 2016-01-06 DIAGNOSIS — M79671 Pain in right foot: Secondary | ICD-10-CM | POA: Diagnosis not present

## 2016-01-06 NOTE — Progress Notes (Signed)
Patient ID: Michele Avila, female   DOB: Jul 27, 1991, 25 y.o.   MRN: [id number]  Subjective: Michele Avila is a 25 y.o. female patient seen today in office for POV #3 (DOS 12-09-2015), S/P Right dorsal long arm Austin Bunionectomy. Patient has no pain at surgical site, denies calf pain, denies headache, chest pain, shortness of breath, nausea, vomiting, fever, or chills. Admits that she has found out she is now pregnant; will follow up with OBGYN this week. No other issues noted.   There are no active problems to display for this patient.  Current Outpatient Prescriptions on File Prior to Visit  Medication Sig Dispense Refill  . acetaminophen (TYLENOL) 500 MG tablet Take 1,000 mg by mouth every 6 (six) hours as needed for headache.    . albuterol (PROVENTIL HFA;VENTOLIN HFA) 108 (90 BASE) MCG/ACT inhaler Inhale 2 puffs into the lungs every 6 (six) hours as needed for wheezing or shortness of breath (exercise induced asthma).     . cetirizine (ZYRTEC) 10 MG tablet Take 1 tablet (10 mg total) by mouth daily. 30 tablet 0  . cyclobenzaprine (FLEXERIL) 5 MG tablet Take 1 tablet (5 mg total) by mouth 3 (three) times daily as needed for muscle spasms. 30 tablet 0  . diclofenac (CATAFLAM) 50 MG tablet Take 1 tablet (50 mg total) by mouth 3 (three) times daily. One tablet TID with food prn pain. 21 tablet 0  . diclofenac sodium (VOLTAREN) 1 % GEL Apply 1 application topically 4 (four) times daily. 100 g 0  . EPINEPHrine (EPI-PEN) 0.3 mg/0.3 mL SOAJ Inject 0.3 mg into the muscle once.    Marland Kitchen GARCINIA CAMBOGIA-CHROMIUM PO Take 2 tablets by mouth 3 (three) times daily.    Marland Kitchen ibuprofen (ADVIL,MOTRIN) 800 MG tablet Take 1 tablet (800 mg total) by mouth every 8 (eight) hours as needed. (Patient taking differently: Take 800 mg by mouth every 8 (eight) hours as needed for fever, headache or mild pain. ) 21 tablet 0  . ipratropium (ATROVENT) 0.06 % nasal spray Place 2 sprays into both nostrils 4 (four) times daily. 15 mL 0   . lidocaine (LIDODERM) 5 % Place 1 patch onto the skin daily. Remove & Discard patch within 12 hours or as directed by MD 30 patch 0  . Multiple Vitamins-Minerals (HAIR/SKIN/NAILS PO) Take 1 tablet by mouth daily.    . naproxen (NAPROSYN) 375 MG tablet Take 1 tablet (375 mg total) by mouth 2 (two) times daily. 20 tablet 0  . OVER THE COUNTER MEDICATION otc cold and cough syrup    . oxyCODONE-acetaminophen (ROXICET) 5-325 MG tablet Take 1-2 tablets by mouth every 6 (six) hours as needed for moderate pain or severe pain. 30 tablet 0  . triamcinolone cream (KENALOG) 0.1 % Apply to the right outer ear twice a day when necessary swelling and itching. 15 g 0   No current facility-administered medications on file prior to visit.   Allergies  Allergen Reactions  . Latex Hives and Rash   Objective: General: No acute distress, AAOx3  Right foot: Incision copating well with no gapping or dehiscence at surgical site, mild swelling to forefoot on right foot, no erythema, no warmth, no drainage, no signs of infection noted, Capillary fill time <3 seconds in all digits, gross sensation present via light touch to right foot. No pain or crepitation with range of motion right foot.  No pain with calf compression.   Assessment and Plan:  Problem List Items Addressed This Visit  None    Visit Diagnoses    Right foot pain    -  Primary    Status post right foot surgery        Right long dorsal arm [place], healing well, 12-09-15       -Patient seen and evaluated -Removed steri-strips at incision site; Applied compression stockinet and instructed on use to assist with edema control -Advised patient to transistion from post op to normal shoe over the next week  -Advised patient to limit activity to necessity  -Advised patient to ice and elevate as necessary  -No pain meds given at this time due to patient pregnancy status; patient advised to follow up with OBGYN. -Work note give to refrain for an  additional month -Patient to return in 2 weeks for follow up office visit. In the meantime, patient to call office if any issues or problems arise.   Landis Martins, DPM

## 2016-01-11 ENCOUNTER — Inpatient Hospital Stay (HOSPITAL_COMMUNITY): Payer: Medicaid Other

## 2016-01-11 ENCOUNTER — Inpatient Hospital Stay (HOSPITAL_COMMUNITY)
Admission: AD | Admit: 2016-01-11 | Discharge: 2016-01-11 | Disposition: A | Discharge status: Home / Self Care | Payer: Medicaid Other | Source: Ambulatory Visit | Attending: Obstetrics and Gynecology | Admitting: Obstetrics and Gynecology

## 2016-01-11 ENCOUNTER — Encounter (HOSPITAL_COMMUNITY): Payer: Self-pay | Admitting: *Deleted

## 2016-01-11 DIAGNOSIS — R109 Unspecified abdominal pain: Secondary | ICD-10-CM | POA: Diagnosis not present

## 2016-01-11 DIAGNOSIS — O3680X0 Pregnancy with inconclusive fetal viability, not applicable or unspecified: Secondary | ICD-10-CM

## 2016-01-11 DIAGNOSIS — O219 Vomiting of pregnancy, unspecified: Secondary | ICD-10-CM | POA: Diagnosis not present

## 2016-01-11 DIAGNOSIS — Z3A01 Less than 8 weeks gestation of pregnancy: Secondary | ICD-10-CM | POA: Insufficient documentation

## 2016-01-11 DIAGNOSIS — O9989 Other specified diseases and conditions complicating pregnancy, childbirth and the puerperium: Secondary | ICD-10-CM

## 2016-01-11 DIAGNOSIS — R112 Nausea with vomiting, unspecified: Secondary | ICD-10-CM | POA: Diagnosis present

## 2016-01-11 DIAGNOSIS — R197 Diarrhea, unspecified: Secondary | ICD-10-CM | POA: Diagnosis not present

## 2016-01-11 DIAGNOSIS — O26899 Other specified pregnancy related conditions, unspecified trimester: Secondary | ICD-10-CM

## 2016-01-11 LAB — BASIC METABOLIC PANEL
Anion gap: 10 (ref 5–15)
BUN: 8 mg/dL (ref 6–20)
CO2: 23 mmol/L (ref 22–32)
Calcium: 9.4 mg/dL (ref 8.9–10.3)
Chloride: 100 mmol/L — ABNORMAL LOW (ref 101–111)
Creatinine, Ser: 0.63 mg/dL (ref 0.44–1.00)
GFR calc Af Amer: >60 mL/min (ref >60)
GFR calc non Af Amer: >60 mL/min (ref >60)
Glucose, Bld: 92 mg/dL (ref 65–99)
Potassium: 3.9 mmol/L (ref 3.5–5.1)
Sodium: 133 mmol/L — ABNORMAL LOW (ref 135–145)

## 2016-01-11 LAB — POCT PREGNANCY, URINE: Preg Test, Ur: POSITIVE — AB

## 2016-01-11 LAB — CBC
HCT: 40.9 % (ref 36.0–46.0)
Hemoglobin: 13.9 g/dL (ref 12.0–15.0)
MCH: 30.5 pg (ref 26.0–34.0)
MCHC: 34 g/dL (ref 30.0–36.0)
MCV: 89.9 fL (ref 78.0–100.0)
Platelets: 209 10*3/uL (ref 150–400)
RBC: 4.55 MIL/uL (ref 3.87–5.11)
RDW: 13.1 % (ref 11.5–15.5)
WBC: 7.4 10*3/uL (ref 4.0–10.5)

## 2016-01-11 LAB — HCG, QUANTITATIVE, PREGNANCY: hCG, Beta Chain, Quant, S: 74288 m[IU]/mL — ABNORMAL HIGH (ref <5)

## 2016-01-11 LAB — URINE MICROSCOPIC-ADD ON

## 2016-01-11 LAB — WET PREP, GENITAL
Sperm: NONE SEEN
Trich, Wet Prep: NONE SEEN
Yeast Wet Prep HPF POC: NONE SEEN

## 2016-01-11 LAB — URINALYSIS, ROUTINE W REFLEX MICROSCOPIC
Bilirubin Urine: NEGATIVE
GLUCOSE, UA: NEGATIVE mg/dL
KETONES UR: NEGATIVE mg/dL
Leukocytes, UA: NEGATIVE
Nitrite: NEGATIVE
PH: 6.5 (ref 5.0–8.0)
PROTEIN: 30 mg/dL — AB
Specific Gravity, Urine: 1.015 (ref 1.005–1.030)

## 2016-01-11 LAB — ABO/RH: ABO/RH(D): O NEG

## 2016-01-11 MED ORDER — SODIUM CHLORIDE 0.9 % IV SOLN
25.0000 mg | Freq: Once | INTRAVENOUS | Status: AC
  Administered 2016-01-11: 25 mg via INTRAVENOUS
  Filled 2016-01-11: qty 1

## 2016-01-11 MED ORDER — PROMETHAZINE HCL 25 MG PO TABS: 25.0000 mg | ORAL_TABLET | Freq: Four times a day (QID) | ORAL | Status: DC | PRN

## 2016-01-11 NOTE — MAU Note (Signed)
IV infusion complete. Pt. States nausea is a lot better. Has not vomited since being here but did dry heave one time.

## 2016-01-11 NOTE — MAU Note (Signed)
Pt. States she took a home pregnancy test 5 days ago. States her nausea is very bad. Vomited 7 times today and is unable to tolerate any food or liquids. Pt. States she has had diarrhea for a week and this is about 4 times a day. Denies fever or chills. Denies being around anyone that has been sick. Pt. States back and abdominal pain began last night. Denies bleeding. Denies abnormal discharge. Pt. States she called OBGYN but did not schedule an appointment yet. Here for evaluation.

## 2016-01-11 NOTE — MAU Provider Note (Signed)
History     CSN: [id number]  Arrival date and time: 01/11/16 1440   First Provider Initiated Contact with Patient 01/11/16 1537      Chief Complaint  Patient presents with  . Back Pain  . Abdominal Pain   HPI  Michele Avila is a 25 y.o. [id number] at [redacted]w[redacted]d. She presents with nausea, vomiting and diarrhea x 1 wk. She vomits ~7-10x/d, had 4 loose BM's daily. She started low abd pain and low back pain yesterday. She denies changes in discharge, odor or itching. She has urinary frequency, no urgency or dysuria. No bleeding or spotting.   OB History    Gravida Para Term Preterm AB TAB SAB Ectopic Multiple Living   5 2 2  2 2    2       Past Medical History  Diagnosis Date  . Seasonal allergies   . Environmental allergies   . Anemia   . Infection     UTI  . Wrist fracture, right   . Asthma     hx chronic bronchitis/exercise induced asthma as pre teen only    Past Surgical History  Procedure Laterality Date  . Cesarean section  2008  . Dilation and curettage of uterus  08/2011  . Induced abortion    . Cesarean section N/A 06/15/2013    Procedure: CESAREAN SECTION;  Surgeon: Allyn Kenner, DO;  Location: [facility];  Service: Obstetrics;  Laterality: N/A;    Family History  Problem Relation Age of Onset  . Hypertension Mother   . Cancer Father     liver  . Diabetes Father   . Hypertension Father   . Hypertension Maternal Grandmother   . Kidney disease Maternal Grandmother   . Diabetes Paternal Grandmother   . Hypertension Paternal Grandmother     Social History  Substance Use Topics  . Smoking status: Former Smoker -- 0.25 packs/day    Types: Cigarettes  . Smokeless tobacco: Never Used     Comment: quit in April 2014  . Alcohol Use: No     Comment: occassionally     Allergies:  Allergies  Allergen Reactions  . Latex Hives and Rash    Prescriptions prior to admission  Medication Sig Dispense Refill Last Dose  . cetirizine (ZYRTEC) 10 MG tablet Take 1 tablet  (10 mg total) by mouth daily. 30 tablet 0   . ibuprofen (ADVIL,MOTRIN) 800 MG tablet Take 1 tablet (800 mg total) by mouth every 8 (eight) hours as needed. (Patient taking differently: Take 800 mg by mouth every 8 (eight) hours as needed for fever, headache or mild pain. ) 21 tablet 0 Unknown at Unknown time  . oxyCODONE-acetaminophen (ROXICET) 5-325 MG tablet Take 1-2 tablets by mouth every 6 (six) hours as needed for moderate pain or severe pain. 30 tablet 0   . acetaminophen (TYLENOL) 500 MG tablet Take 1,000 mg by mouth every 6 (six) hours as needed for headache.   Unknown at Unknown time  . cyclobenzaprine (FLEXERIL) 5 MG tablet Take 1 tablet (5 mg total) by mouth 3 (three) times daily as needed for muscle spasms. 30 tablet 0 Unknown at Unknown time  . diclofenac (CATAFLAM) 50 MG tablet Take 1 tablet (50 mg total) by mouth 3 (three) times daily. One tablet TID with food prn pain. 21 tablet 0   . diclofenac sodium (VOLTAREN) 1 % GEL Apply 1 application topically 4 (four) times daily. 100 g 0 Unknown at Unknown time  . ipratropium (ATROVENT) 0.06 % nasal  spray Place 2 sprays into both nostrils 4 (four) times daily. 15 mL 0   . lidocaine (LIDODERM) 5 % Place 1 patch onto the skin daily. Remove & Discard patch within 12 hours or as directed by MD 30 patch 0 Unknown at Unknown time  . naproxen (NAPROSYN) 375 MG tablet Take 1 tablet (375 mg total) by mouth 2 (two) times daily. 20 tablet 0 Unknown at Unknown time  . triamcinolone cream (KENALOG) 0.1 % Apply to the right outer ear twice a day when necessary swelling and itching. 15 g 0 Unknown at Unknown time    Review of Systems  Constitutional: Negative for fever and chills.  Gastrointestinal: Positive for nausea, vomiting, abdominal pain and diarrhea.  Genitourinary: Positive for frequency. Negative for dysuria and urgency.  Musculoskeletal: Positive for back pain.   Physical Exam   Blood pressure 135/89, pulse 121, temperature 98.8 F (37.1  C), resp. rate 18, last menstrual period 11/22/2015.  Physical Exam  Constitutional: She is oriented to person, place, and time. She appears well-developed and well-nourished.  GI: Soft. Bowel sounds are normal. There is tenderness (L>R).  Genitourinary:  Pelvic exam- Ext gen- nl anatomy, skin intact Vagina- sm amt thin white discharge Cx- closed Uterus- nl size, non tender Adn- no masses palp, sl tender on left  Musculoskeletal: Normal range of motion.  Neurological: She is alert and oriented to person, place, and time.  Skin: Skin is warm and dry.  Psychiatric: She has a normal mood and affect. Her behavior is normal.    MAU Course  Procedures  MDM Results for orders placed or performed during the hospital encounter of 01/11/16 (from the past 24 hour(s))  Urinalysis, Routine w reflex microscopic (not at [facility])     Status: Abnormal   Collection Time: 01/11/16  2:00 PM  Result Value Ref Range   Color, Urine YELLOW YELLOW   APPearance CLEAR CLEAR   Specific Gravity, Urine 1.015 1.005 - 1.030   pH 6.5 5.0 - 8.0   Glucose, UA NEGATIVE NEGATIVE mg/dL   Hgb urine dipstick TRACE (A) NEGATIVE   Bilirubin Urine NEGATIVE NEGATIVE   Ketones, ur NEGATIVE NEGATIVE mg/dL   Protein, ur 30 (A) NEGATIVE mg/dL   Nitrite NEGATIVE NEGATIVE   Leukocytes, UA NEGATIVE NEGATIVE  Urine microscopic-add on     Status: Abnormal   Collection Time: 01/11/16  2:00 PM  Result Value Ref Range   Squamous Epithelial / LPF 6-30 (A) NONE SEEN   WBC, UA 0-5 0 - 5 WBC/hpf   RBC / HPF 0-5 0 - 5 RBC/hpf   Bacteria, UA RARE (A) NONE SEEN   Casts HYALINE CASTS (A) NEGATIVE   Urine-Other MUCOUS PRESENT   Pregnancy, urine POC     Status: Abnormal   Collection Time: 01/11/16  3:27 PM  Result Value Ref Range   Preg Test, Ur POSITIVE (A) NEGATIVE  hCG, quantitative, pregnancy     Status: Abnormal   Collection Time: 01/11/16  4:01 PM  Result Value Ref Range   hCG, Beta Chain, Quant, S 74288 (H) <5 mIU/mL  CBC      Status: None   Collection Time: 01/11/16  4:01 PM  Result Value Ref Range   WBC 7.4 4.0 - 10.5 K/uL   RBC 4.55 3.87 - 5.11 MIL/uL   Hemoglobin 13.9 12.0 - 15.0 g/dL   HCT 40.9 36.0 - 46.0 %   MCV 89.9 78.0 - 100.0 fL   MCH 30.5 26.0 - 34.0 pg  MCHC 34.0 30.0 - 36.0 g/dL   RDW 13.1 11.5 - 15.5 %   Platelets 209 150 - 400 K/uL  ABO/Rh     Status: None   Collection Time: 01/11/16  4:01 PM  Result Value Ref Range   ABO/RH(D) O NEG   Basic metabolic panel     Status: Abnormal   Collection Time: 01/11/16  4:01 PM  Result Value Ref Range   Sodium 133 (L) 135 - 145 mmol/L   Potassium 3.9 3.5 - 5.1 mmol/L   Chloride 100 (L) 101 - 111 mmol/L   CO2 23 22 - 32 mmol/L   Glucose, Bld 92 65 - 99 mg/dL   BUN 8 6 - 20 mg/dL   Creatinine, Ser 0.63 0.44 - 1.00 mg/dL   Calcium 9.4 8.9 - 10.3 mg/dL   GFR calc non Af Amer >60 >60 mL/min   GFR calc Af Amer >60 >60 mL/min   Anion gap 10 5 - 15  Wet prep, genital     Status: Abnormal   Collection Time: 01/11/16  4:16 PM  Result Value Ref Range   Yeast Wet Prep HPF POC NONE SEEN NONE SEEN   Trich, Wet Prep NONE SEEN NONE SEEN   Clue Cells Wet Prep HPF POC PRESENT (A) NONE SEEN   WBC, Wet Prep HPF POC FEW (A) NONE SEEN   Sperm NONE SEEN    U/S- 6/5/7 wks IUP, FHR 124, right CLC, EDC 08/31/2016. Labs neg  Assessment and Plan  6 5/7 wks by U/S N&V management reviewed Preg verification letter to pt  Elesa Massed. 01/11/2016, 3:53 PM

## 2016-01-11 NOTE — MAU Note (Signed)
Pt presents to MAU with complaints of lower back and abdominal pain. Pt states she had a + home pregnancy test 5 days ago. Had surgery on her foot January the 16th and pregnancy test was negative at that time

## 2016-01-12 LAB — HIV ANTIBODY (ROUTINE TESTING W REFLEX): HIV Screen 4th Generation wRfx: NONREACTIVE

## 2016-01-13 LAB — GC/CHLAMYDIA PROBE AMP (~~LOC~~) NOT AT ARMC
Chlamydia: NEGATIVE
Neisseria Gonorrhea: NEGATIVE

## 2016-01-20 ENCOUNTER — Encounter (HOSPITAL_COMMUNITY): Payer: Self-pay | Admitting: *Deleted

## 2016-01-20 ENCOUNTER — Inpatient Hospital Stay (HOSPITAL_COMMUNITY)
Admission: EM | Admit: 2016-01-20 | Discharge: 2016-01-20 | Disposition: A | Discharge status: Home / Self Care | Payer: Medicaid Other | Source: Ambulatory Visit | Attending: Family Medicine | Admitting: Family Medicine

## 2016-01-20 ENCOUNTER — Encounter: Payer: Medicaid Other | Admitting: Sports Medicine

## 2016-01-20 ENCOUNTER — Inpatient Hospital Stay (HOSPITAL_COMMUNITY): Payer: Medicaid Other

## 2016-01-20 DIAGNOSIS — G8918 Other acute postprocedural pain: Secondary | ICD-10-CM | POA: Diagnosis not present

## 2016-01-20 DIAGNOSIS — O034 Incomplete spontaneous abortion without complication: Secondary | ICD-10-CM | POA: Diagnosis not present

## 2016-01-20 DIAGNOSIS — F17210 Nicotine dependence, cigarettes, uncomplicated: Secondary | ICD-10-CM | POA: Insufficient documentation

## 2016-01-20 DIAGNOSIS — J45909 Unspecified asthma, uncomplicated: Secondary | ICD-10-CM | POA: Diagnosis not present

## 2016-01-20 DIAGNOSIS — R109 Unspecified abdominal pain: Secondary | ICD-10-CM | POA: Diagnosis present

## 2016-01-20 DIAGNOSIS — O074 Failed attempted termination of pregnancy without complication: Secondary | ICD-10-CM

## 2016-01-20 LAB — URINALYSIS, ROUTINE W REFLEX MICROSCOPIC
GLUCOSE, UA: NEGATIVE mg/dL
Ketones, ur: >80 mg/dL — AB
Leukocytes, UA: NEGATIVE
Nitrite: NEGATIVE
PH: 6.5 (ref 5.0–8.0)
Protein, ur: 30 mg/dL — AB
SPECIFIC GRAVITY, URINE: 1.025 (ref 1.005–1.030)

## 2016-01-20 LAB — URINE MICROSCOPIC-ADD ON

## 2016-01-20 LAB — CBC
HEMATOCRIT: 37.1 % (ref 36.0–46.0)
Hemoglobin: 12.5 g/dL (ref 12.0–15.0)
MCH: 30.1 pg (ref 26.0–34.0)
MCHC: 33.7 g/dL (ref 30.0–36.0)
MCV: 89.4 fL (ref 78.0–100.0)
Platelets: 224 10*3/uL (ref 150–400)
RBC: 4.15 MIL/uL (ref 3.87–5.11)
RDW: 13.2 % (ref 11.5–15.5)
WBC: 12.4 10*3/uL — AB (ref 4.0–10.5)

## 2016-01-20 IMAGING — US US OB TRANSVAGINAL
1 series · 15 of 27 positions shown · non-contrast
Comparison: Ultrasound dated [DATE]

CLINICAL DATA: 24-year-old female with positive HCG levels
presenting with abdominal pain status post dilatation and curettage

EXAM:
TRANSVAGINAL OB ULTRASOUND
TECHNIQUE: Transvaginal ultrasound was performed for complete evaluation of the
gestation as well as the maternal uterus, adnexal regions, and
pelvic cul-de-sac.

[Series 1: us ob transvaginal · 15 of 27 slices shown]
[im 1/27]
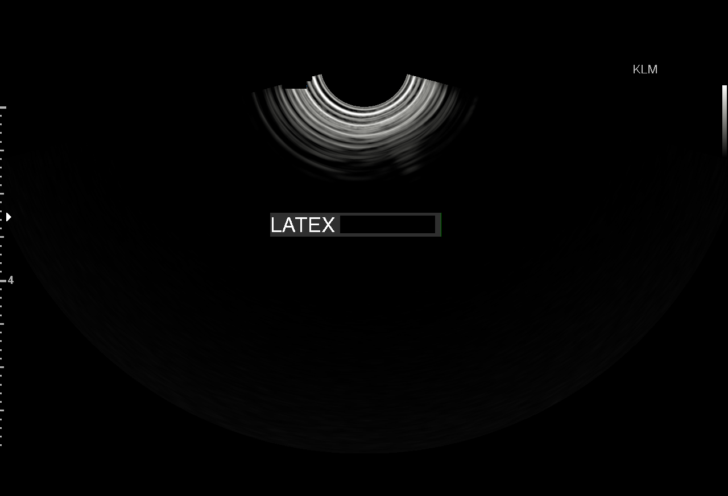
[im 3/27]
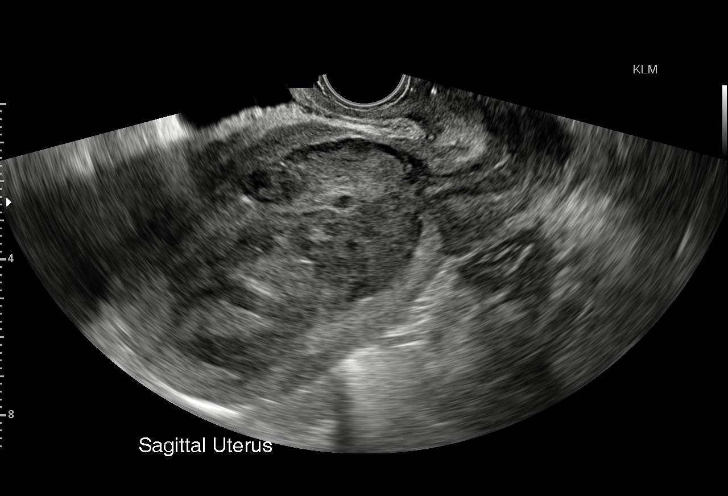
[im 5/27]
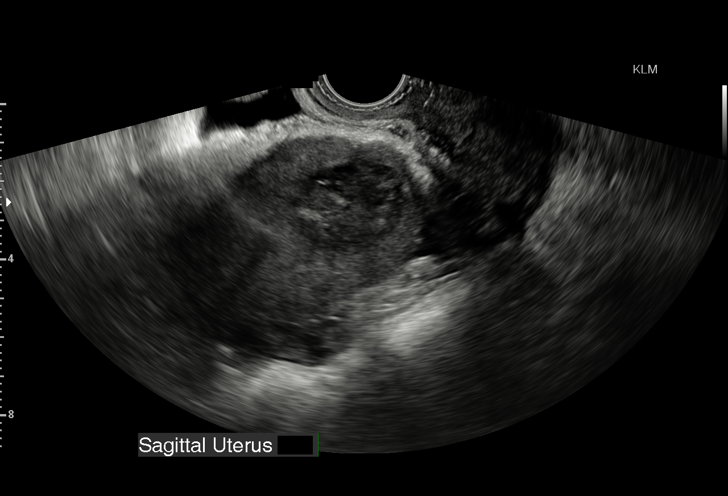
[im 7/27]
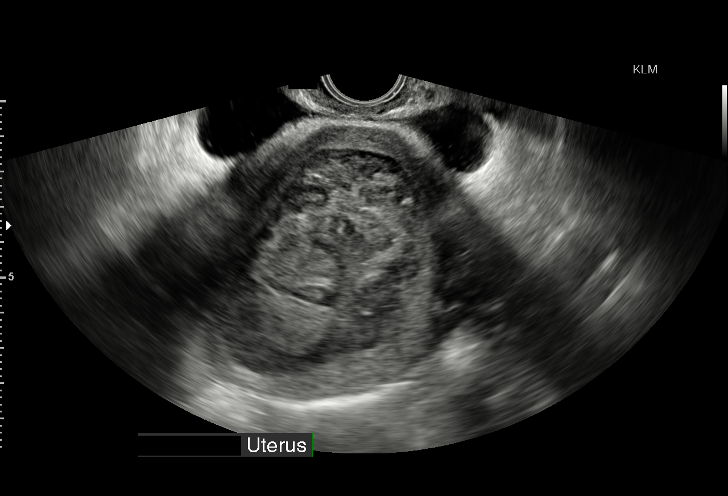
[im 9/27]
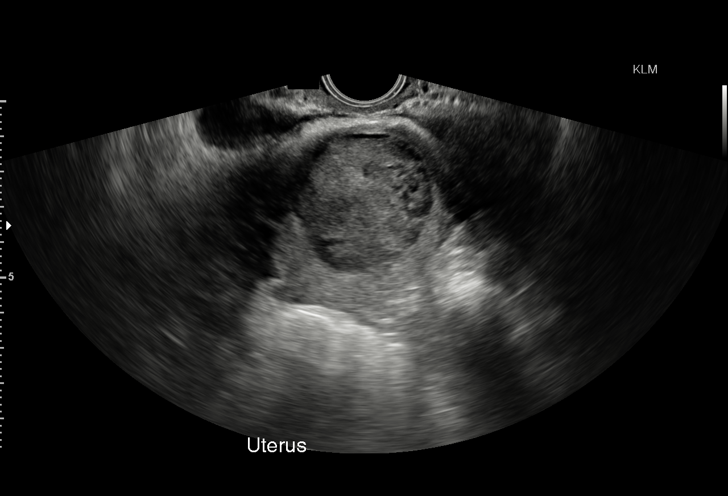
[im 10/27]
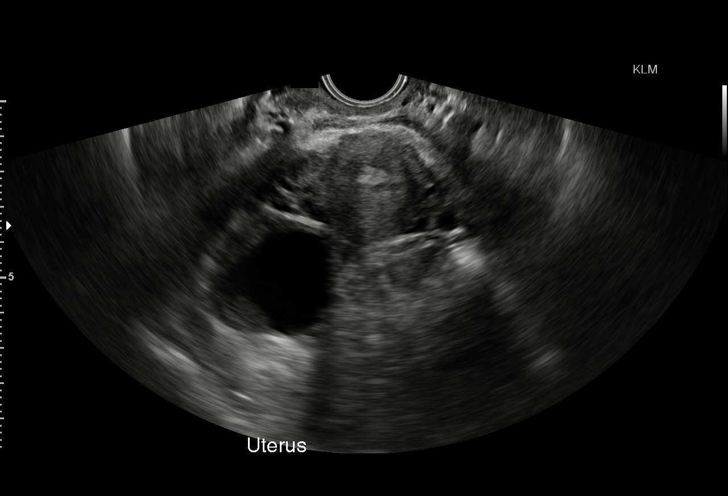
[im 12/27]
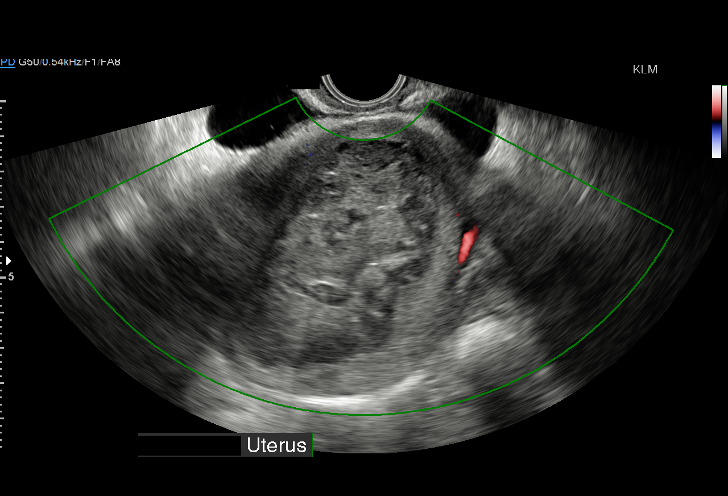
[im 14/27]
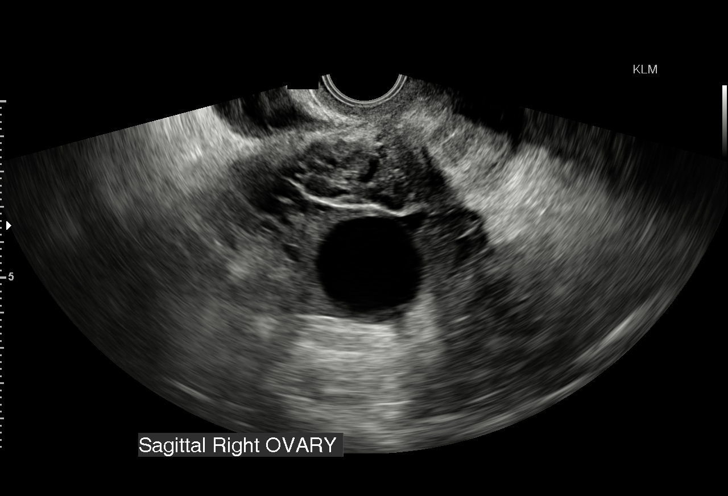
[im 16/27]
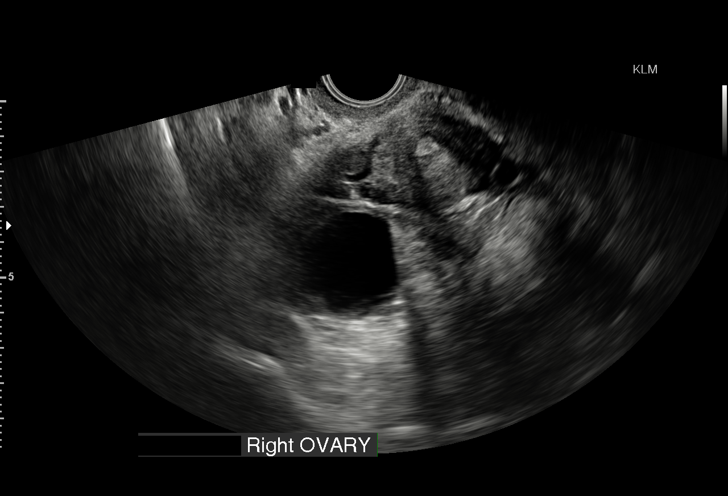
[im 18/27]
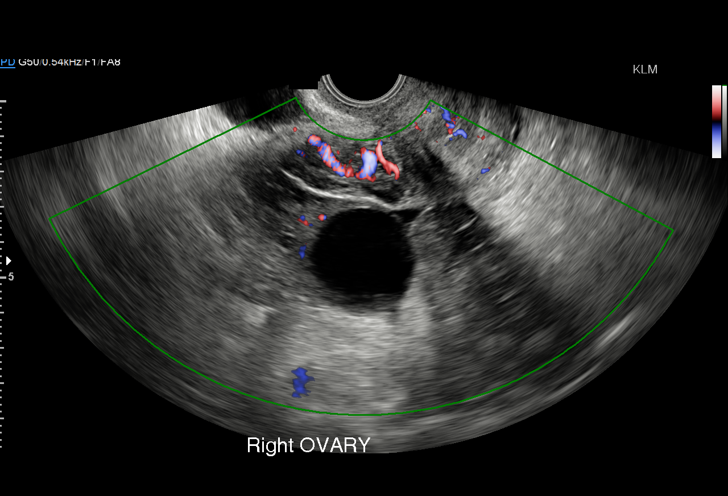
[im 19/27]
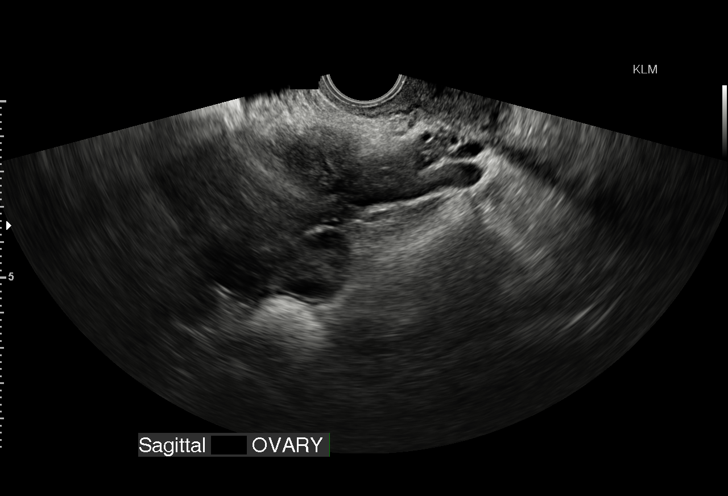
[im 21/27]
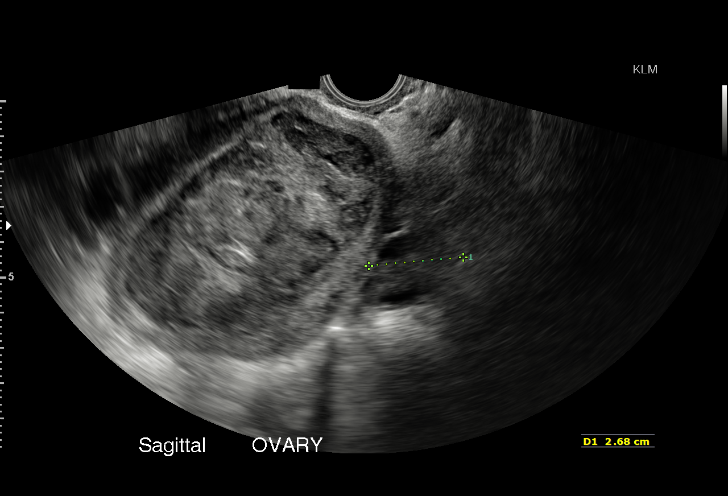
[im 23/27]
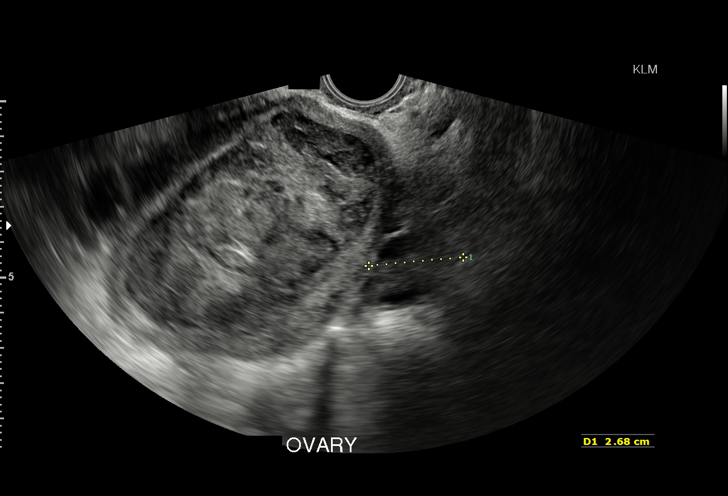
[im 25/27]
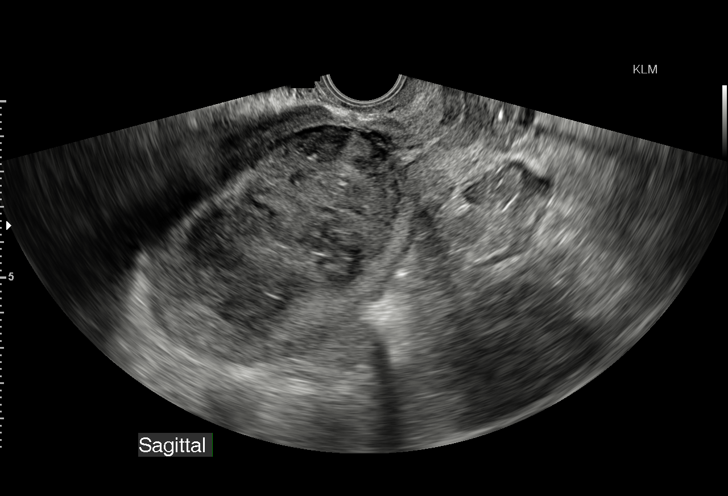
[im 27/27]
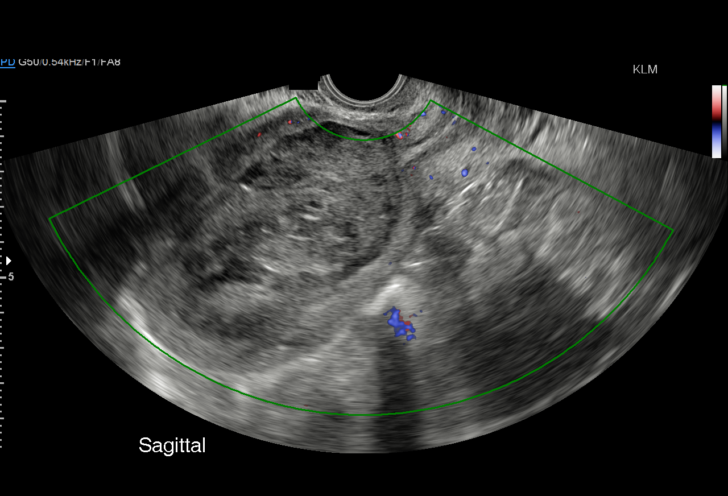

[15 of 27 positions shown; findings below may reference images not displayed]

FINDINGS: No intrauterine pregnancy identified. The uterus is anteverted. The
endometrium is thickened. Heterogeneous echogenic content with no
detectable internal Doppler flow is noted within the endometrial
canal most compatible with blood clot.

The right ovary measures 4.7 x 3.4 x 4.1 cm and the left ovary
measures 4.7 x 1.8 x 2.7 cm. There is a 3 cm dominant follicle/cyst
in the right ovary.

No free fluid noted within the pelvis.
IMPRESSION: No intrauterine pregnancy identified.

The endometrial canal is filled with blood clot.

## 2016-01-20 MED ORDER — KETOROLAC TROMETHAMINE 60 MG/2ML IM SOLN
60.0000 mg | Freq: Once | INTRAMUSCULAR | Status: AC
  Administered 2016-01-20: 60 mg via INTRAMUSCULAR
  Filled 2016-01-20: qty 2

## 2016-01-20 MED ORDER — OXYCODONE-ACETAMINOPHEN 5-325 MG PO TABS
1.0000 | ORAL_TABLET | Freq: Once | ORAL | Status: AC
  Administered 2016-01-20: 1 via ORAL
  Filled 2016-01-20: qty 1

## 2016-01-20 MED ORDER — AMOXICILLIN-POT CLAVULANATE 875-125 MG PO TABS
1.0000 | ORAL_TABLET | Freq: Once | ORAL | Status: AC
  Administered 2016-01-20: 1 via ORAL
  Filled 2016-01-20: qty 1

## 2016-01-20 MED ORDER — AMOXICILLIN-POT CLAVULANATE 875-125 MG PO TABS: 1.0000 | ORAL_TABLET | Freq: Two times a day (BID) | ORAL | Status: DC

## 2016-01-20 MED ORDER — ONDANSETRON 8 MG PO TBDP
8.0000 mg | ORAL_TABLET | Freq: Once | ORAL | Status: AC
Start: 2016-01-20 — End: 2016-01-20
  Administered 2016-01-20: 8 mg via ORAL
  Filled 2016-01-20: qty 1

## 2016-01-20 MED ORDER — METHYLERGONOVINE MALEATE 0.2 MG PO TABS
0.2000 mg | ORAL_TABLET | Freq: Once | ORAL | Status: AC
Start: 2016-01-20 — End: 2016-01-20
  Administered 2016-01-20: 0.2 mg via ORAL
  Filled 2016-01-20: qty 1

## 2016-01-20 MED ORDER — METHYLERGONOVINE MALEATE 0.2 MG PO TABS: 0.2000 mg | ORAL_TABLET | Freq: Four times a day (QID) | ORAL | Status: DC

## 2016-01-20 MED ORDER — OXYCODONE-ACETAMINOPHEN 5-325 MG PO TABS: 1.0000 | ORAL_TABLET | Freq: Four times a day (QID) | ORAL | Status: DC | PRN

## 2016-01-20 MED ORDER — ONDANSETRON 8 MG PO TBDP: 8.0000 mg | ORAL_TABLET | Freq: Three times a day (TID) | ORAL | Status: DC | PRN

## 2016-01-20 NOTE — MAU Note (Addendum)
Had a D&C ( abortion, per paperwork)  this morning (1030).  Is having excruciating pain, started about 2 hrs ago.  Tried to medication, but can't keep it down

## 2016-01-20 NOTE — MAU Provider Note (Signed)
Chief Complaint: Post-op Problem   First Provider Initiated Contact with Patient 01/20/16 1804     SUBJECTIVE HPI: Michele Avila is a 25 y.o. [id number] status post D&C for elective termination at a woman's choice this morning who presents to Maternity Admissions reporting severe low abdominal pain since noon. She was 8 weeks 3 days with confirmed IUP per ultrasound in maternity admissions 01/11/2016. States she received ibuprofen 800 mg after the procedure at [facility] choice and had minimal pain for several hours after the procedure, but was unable to keep down her next dose of ibuprofen due to nausea and vomiting. Has now vomited 12 times today (had been having some nausea and vomiting of pregnancy before this).   Had negative GC, chlamydia and Trichomonas testing 01/11/2016. Positive for BV that day.  Location: Suprapubic Quality: Cramping Severity: 10+/10 on pain scale Duration: 6 hours Context: Since D&C Timing: Constant  Modifying factors: None. Hasn't been able to keep down pain medicine. No improvement with warm compress. Associated signs and symptoms: Positive for nausea, vomiting and scant bleeding. Negative for fever, chills, diarrhea, constipation, vaginal discharge, urinary complaints.  Past Medical History  Diagnosis Date  . Seasonal allergies   . Environmental allergies   . Anemia   . Infection     UTI  . Wrist fracture, right   . Asthma     hx chronic bronchitis/exercise induced asthma as pre teen only   OB History  Gravida Para Term Preterm AB SAB TAB Ectopic Multiple Living  5 2 2  3  2   2     # Outcome Date GA Lbr Len/2nd Weight Sex Delivery Anes PTL Lv  5 AB 2017          4 Term 06/15/13 [redacted]w[redacted]d  8 lb 13.6 oz (4.015 kg) M CS-LTranv Spinal  Y  3 TAB 06/2012          2 TAB 10/2011          1 Term 10/28/07   9 lb 11 oz (4.394 kg) M CS-LTranv        Comments: adopted     Past Surgical History  Procedure Laterality Date  . Cesarean section  2008  . Dilation and  curettage of uterus  08/2011  . Induced abortion    . Cesarean section N/A 06/15/2013    Procedure: CESAREAN SECTION;  Surgeon: Allyn Kenner, DO;  Location: [facility];  Service: Obstetrics;  Laterality: N/A;  . Foot surgery  2017   Social History   Social History  . Marital Status: Single    Spouse Name: N/A  . Number of Children: N/A  . Years of Education: N/A   Occupational History  . Not on file.   Social History Main Topics  . Smoking status: Current Every Day Smoker -- 0.50 packs/day    Types: Cigarettes  . Smokeless tobacco: Never Used     Comment: quit in April 2014  . Alcohol Use: No     Comment: occassionally   . Drug Use: No     Comment: A Week ago was the last time she used.   Marland Kitchen Sexual Activity: No   Other Topics Concern  . Not on file   Social History Narrative   No current facility-administered medications on file prior to encounter.   Current Outpatient Prescriptions on File Prior to Encounter  Medication Sig Dispense Refill  . docusate sodium (COLACE) 100 MG capsule Take 100 mg by mouth daily as needed for mild constipation.    Marland Kitchen  promethazine (PHENERGAN) 25 MG tablet Take 1 tablet (25 mg total) by mouth every 6 (six) hours as needed for nausea or vomiting. 30 tablet 0  . cetirizine (ZYRTEC) 10 MG tablet Take 1 tablet (10 mg total) by mouth daily. (Patient not taking: Reported on 01/11/2016) 30 tablet 0  . ipratropium (ATROVENT) 0.06 % nasal spray Place 2 sprays into both nostrils 4 (four) times daily. (Patient not taking: Reported on 01/11/2016) 15 mL 0   Allergies  Allergen Reactions  . Latex Hives and Rash    I have reviewed the past Medical Hx, Surgical Hx, Social Hx, Allergies and Medications.   Review of Systems  Constitutional: Negative for fever and chills.  Gastrointestinal: Positive for nausea, vomiting and abdominal pain. Negative for diarrhea, constipation and abdominal distention.  Genitourinary: Positive for vaginal bleeding and pelvic  pain. Negative for dysuria, urgency, frequency, hematuria, flank pain, vaginal discharge, difficulty urinating and vaginal pain.  Musculoskeletal: Negative for back pain.  Skin: Negative for pallor.  Neurological: Negative for dizziness.    OBJECTIVE Patient Vitals for the past 24 hrs:  BP Temp Temp src Pulse Resp  01/20/16 1846 127/68 mmHg - - 79 18  01/20/16 1713 131/89 mmHg 98.1 F (36.7 C) Oral 108 20   Constitutional: Well-developed, well-nourished female in moderate distress, rocking.  Cardiovascular: mild tachycardia Respiratory: normal rate and effort.  GI: Abd soft, moderate SP tendernes, fundus @SP . Pos BS x 4 Neurologic: Alert and oriented x 4.  GU: Neg CVAT.  PELVIC EXAM: NEFG, scant, odorless blood noted, cervix closed. Uterus 10-11 week size, no adnexal tenderness or masses. Mild CMT.  LAB RESULTS Results for orders placed or performed during the hospital encounter of 01/20/16 (from the past 24 hour(s))  Urinalysis, Routine w reflex microscopic (not at [facility])     Status: Abnormal   Collection Time: 01/20/16  6:39 PM  Result Value Ref Range   Color, Urine AMBER (A) YELLOW   APPearance CLEAR CLEAR   Specific Gravity, Urine 1.025 1.005 - 1.030   pH 6.5 5.0 - 8.0   Glucose, UA NEGATIVE NEGATIVE mg/dL   Hgb urine dipstick LARGE (A) NEGATIVE   Bilirubin Urine SMALL (A) NEGATIVE   Ketones, ur >80 (A) NEGATIVE mg/dL   Protein, ur 30 (A) NEGATIVE mg/dL   Nitrite NEGATIVE NEGATIVE   Leukocytes, UA NEGATIVE NEGATIVE  Urine microscopic-add on     Status: Abnormal   Collection Time: 01/20/16  6:39 PM  Result Value Ref Range   Squamous Epithelial / LPF 0-5 (A) NONE SEEN   WBC, UA 0-5 0 - 5 WBC/hpf   RBC / HPF 6-30 0 - 5 RBC/hpf   Bacteria, UA MANY (A) NONE SEEN   Urine-Other MUCOUS PRESENT   CBC     Status: Abnormal   Collection Time: 01/20/16  6:55 PM  Result Value Ref Range   WBC 12.4 (H) 4.0 - 10.5 K/uL   RBC 4.15 3.87 - 5.11 MIL/uL   Hemoglobin 12.5 12.0 - 15.0  g/dL   HCT 37.1 36.0 - 46.0 %   MCV 89.4 78.0 - 100.0 fL   MCH 30.1 26.0 - 34.0 pg   MCHC 33.7 30.0 - 36.0 g/dL   RDW 13.2 11.5 - 15.5 %   Platelets 224 150 - 400 K/uL    IMAGING US Ob Transvaginal  01/20/2016  CLINICAL DATA:  25 year old female with positive HCG levels presenting with abdominal pain status post dilatation and curettage EXAM: TRANSVAGINAL OB ULTRASOUND TECHNIQUE: Transvaginal ultrasound was  performed for complete evaluation of the gestation as well as the maternal uterus, adnexal regions, and pelvic cul-de-sac. COMPARISON:  Ultrasound dated 01/11/2016 FINDINGS: No intrauterine pregnancy identified. The uterus is anteverted. The endometrium is thickened. Heterogeneous echogenic content with no detectable internal Doppler flow is noted within the endometrial canal most compatible with blood clot. The right ovary measures 4.7 x 3.4 x 4.1 cm and the left ovary measures 4.7 x 1.8 x 2.7 cm. There is a 3 cm dominant follicle/cyst in the right ovary. No free fluid noted within the pelvis. IMPRESSION: No intrauterine pregnancy identified. The endometrial canal is filled with blood clot. Electronically Signed   By: Anner Crete M.D.   On: 01/20/2016 20:31   MAU COURSE CBC, UA, Toradol ,Zofran, [name], NPO  Discussed Hx, Exam, Labs, [name] w/ Dr. Nehemiah Settle. Will attempt to evacuate clots w/. Methergine and Tx possible endometritis w/ Augmentin. First doses given in MAU.   MDM - 25 year-old female POD#0 elective abortion w/ large amount of retained clots - Pain ,mild CMT and mild leukocytosis suggestive of mild endometritis vs normal post-op pain.   ASSESSMENT 1. Retained products of conception following abortion   2. Acute post-operative pain   3.      Suspected endometritis.   PLAN Discharge home in stable condition per consult w/ Dr. Nehemiah Settle. Post-AB and endometritis Precautions Rx Augmentin, Methergine series, Percocet for breakthrough pain, Zofran.      Follow-up Information     Follow up with [facility] On 01/23/2016.   Specialty:  Obstetrics and Gynecology   Why:  at 1:00 pm   Contact information:   [place] [phone]      Follow up with [place].   Why:  As needed in emergencies (Fever greater than 100.4. severe pain, severe bleeding)   Contact information:   [address] [id number] [place] [phone]       Medication List    TAKE these medications        amoxicillin-clavulanate 875-125 MG tablet  Commonly known as:  AUGMENTIN  Take 1 tablet by mouth 2 (two) times daily.     cetirizine 10 MG tablet  Commonly known as:  ZYRTEC  Take 1 tablet (10 mg total) by mouth daily.     docusate sodium 100 MG capsule  Commonly known as:  COLACE  Take 100 mg by mouth daily as needed for mild constipation.     ibuprofen 800 MG tablet  Commonly known as:  ADVIL,MOTRIN  Take 800 mg by mouth every 8 (eight) hours as needed for moderate pain.     ipratropium 0.06 % nasal spray  Commonly known as:  ATROVENT  Place 2 sprays into both nostrils 4 (four) times daily.     methylergonovine 0.2 MG tablet  Commonly known as:  METHERGINE  Take 1 tablet (0.2 mg total) by mouth 4 (four) times daily.     ondansetron 8 MG disintegrating tablet  Commonly known as:  ZOFRAN ODT  Take 1 tablet (8 mg total) by mouth every 8 (eight) hours as needed for nausea or vomiting.     oxyCODONE-acetaminophen 5-325 MG tablet  Commonly known as:  PERCOCET/ROXICET  Take 1-2 tablets by mouth every 6 (six) hours as needed for severe pain.     promethazine 25 MG tablet  Commonly known as:  PHENERGAN  Take 1 tablet (25 mg total) by mouth every 6 (  six) hours as needed for nausea or vomiting.         [place], [place] 01/20/2016  10:03 PM

## 2016-01-20 NOTE — Discharge Instructions (Signed)
See hand out.

## 2016-01-23 ENCOUNTER — Encounter: Payer: Self-pay | Admitting: Obstetrics & Gynecology

## 2016-01-23 ENCOUNTER — Ambulatory Visit (INDEPENDENT_AMBULATORY_CARE_PROVIDER_SITE_OTHER): Payer: Medicaid Other | Admitting: Obstetrics & Gynecology

## 2016-01-23 VITALS — BP 137/80 | HR 79 | Temp 98.8°F | Ht 67.0 in | Wt 196.2 lb

## 2016-01-23 DIAGNOSIS — O031 Delayed or excessive hemorrhage following incomplete spontaneous abortion: Secondary | ICD-10-CM

## 2016-01-23 MED ORDER — MISOPROSTOL 200 MCG PO TABS: ORAL_TABLET | ORAL | Status: DC

## 2016-01-23 NOTE — Progress Notes (Signed)
Patient ID: Michele Avila, female   DOB: 1991-05-01, 25 y.o.   MRN: [id number]  Chief Complaint  Patient presents with  . Follow-up    mau 2/27  Had 8 week abortion 2/27 and US showed clots HPI Michele Avila is a 25 y.o. female.  [id number] Still cramps and little bleeding after taking methergine following TAB.   HPI  Past Medical History  Diagnosis Date  . Seasonal allergies   . Environmental allergies   . Anemia   . Infection     UTI  . Wrist fracture, right   . Asthma     hx chronic bronchitis/exercise induced asthma as pre teen only    Past Surgical History  Procedure Laterality Date  . Cesarean section  2008  . Dilation and curettage of uterus  08/2011  . Induced abortion    . Cesarean section N/A 06/15/2013    Procedure: CESAREAN SECTION;  Surgeon: Allyn Kenner, DO;  Location: [facility];  Service: Obstetrics;  Laterality: N/A;  . Foot surgery  2017    Family History  Problem Relation Age of Onset  . Hypertension Mother   . Cancer Father     liver  . Diabetes Father   . Hypertension Father   . Hypertension Maternal Grandmother   . Kidney disease Maternal Grandmother   . Diabetes Paternal Grandmother   . Hypertension Paternal Grandmother     Social History Social History  Substance Use Topics  . Smoking status: Current Every Day Smoker -- 0.50 packs/day    Types: Cigarettes  . Smokeless tobacco: Never Used     Comment: quit in April 2014  . Alcohol Use: No     Comment: occassionally     Allergies  Allergen Reactions  . Latex Hives and Rash    Current Outpatient Prescriptions  Medication Sig Dispense Refill  . amoxicillin-clavulanate (AUGMENTIN) 875-125 MG tablet Take 1 tablet by mouth 2 (two) times daily. 14 tablet 0  . docusate sodium (COLACE) 100 MG capsule Take 100 mg by mouth daily as needed for mild constipation.    Marland Kitchen ibuprofen (ADVIL,MOTRIN) 800 MG tablet Take 800 mg by mouth every 8 (eight) hours as needed for moderate pain.    Marland Kitchen ondansetron  (ZOFRAN ODT) 8 MG disintegrating tablet Take 1 tablet (8 mg total) by mouth every 8 (eight) hours as needed for nausea or vomiting. 20 tablet 0  . oxyCODONE-acetaminophen (PERCOCET/ROXICET) 5-325 MG tablet Take 1-2 tablets by mouth every 6 (six) hours as needed for severe pain. 15 tablet 0  . cetirizine (ZYRTEC) 10 MG tablet Take 1 tablet (10 mg total) by mouth daily. (Patient not taking: Reported on 01/11/2016) 30 tablet 0  . ipratropium (ATROVENT) 0.06 % nasal spray Place 2 sprays into both nostrils 4 (four) times daily. (Patient not taking: Reported on 01/11/2016) 15 mL 0  . methylergonovine (METHERGINE) 0.2 MG tablet Take 1 tablet (0.2 mg total) by mouth 4 (four) times daily. (Patient not taking: Reported on 01/23/2016) 4 tablet 0  . misoprostol (CYTOTEC) 200 MCG tablet 2 tablet between cheek and gum and 2 vaginally single dose 4 tablet 0  . promethazine (PHENERGAN) 25 MG tablet Take 1 tablet (25 mg total) by mouth every 6 (six) hours as needed for nausea or vomiting. (Patient not taking: Reported on 01/23/2016) 30 tablet 0   No current facility-administered medications for this visit.    Review of Systems Review of Systems  Constitutional: Negative.   Genitourinary: Positive for vaginal bleeding (scant) and pelvic  pain (cramps). Negative for menstrual problem.    Blood pressure 137/80, pulse 79, temperature 98.8 F (37.1 C), temperature source Oral, height 5\' 7"  (1.702 m), weight 196 lb 3.2 oz (88.996 kg), last menstrual period 11/22/2015, unknown if currently breastfeeding.  Physical Exam Physical Exam  Constitutional: She is oriented to person, place, and time. She appears well-developed. No distress.  Pulmonary/Chest: Effort normal.  Abdominal: Soft. She exhibits no distension and no mass. There is no tenderness.  Neurological: She is alert and oriented to person, place, and time.  Psychiatric: She has a normal mood and affect. Her behavior is normal.    Data Reviewed [name] images and  notes  Assessment    Retained clots after TAB     Plan    Cytotec 400 mg buccal and vaginal and RTC 1 week        Danaiya Steadman 01/23/2016, 1:56 PM

## 2016-01-23 NOTE — Patient Instructions (Signed)
Prostaglandin-Induced Abortion Prostaglandin-induced abortion is a procedure that uses prostaglandins to end a pregnancy. Prostaglandins are hormone-like medicines used to start labor by making the uterus contract. Prostaglandin-induced abortion can be done in the first 2 months of pregnancy.  LET [facility] CARE PROVIDER KNOW ABOUT:  Any allergies you have.  All medicines you are taking, including vitamins, herbs, eye drops, creams, and over-the-counter medicines.  Previous problems you or members of your family have had with the use of anesthetics.  Any blood disorders you have.  Previous surgeries you have had.  Medical conditions you have. RISKS AND COMPLICATIONS  Generally, this is a safe procedure. However, as with any procedure, problems can occur. Possible problems include:  Parts of the placenta and other afterbirth remaining in your uterus. If this happens, you may need to have a procedure where the inside of your uterus is scraped to remove it (dilation and curettage).  Increased bleeding.  The procedure not working. If this happens, you may also need to have a dilation and curettage. BEFORE THE PROCEDURE Your health care provider may:  Do an internal exam (pelvic exam).  Do a blood test to confirm the pregnancy.  Use a test called an ultrasound to get an image of your uterus and estimate the stage of pregnancy. After discussing the procedure with your health care provider, you will be asked to sign a consent form. PROCEDURE  You will take a medicine in tablet form. The medicine will begin to end the pregnancy. After you take the medicine, you will be able to go home. Within 1-3 days of taking this medicine, your health care provider will tell you to take more medicine by mouth or by putting it directly into your vagina. Follow your health care provider's instructions carefully. After about 2 weeks, when you have stopped bleeding, you may have an ultrasound to confirm  you are no longer pregnant.   This information is not intended to replace advice given to you by your health care provider. Make sure you discuss any questions you have with your health care provider.   Document Released: 11/12/2003 Document Revised: 11/14/2013 Document Reviewed: 10/04/2013 Elsevier Interactive Patient Education [employer].

## 2016-01-30 ENCOUNTER — Telehealth: Payer: Self-pay | Admitting: General Practice

## 2016-01-30 NOTE — Telephone Encounter (Signed)
Patient called into front office stating she was here last week and given cytotec. Patient states she had bleeding for like 2 hours and then it stopped. Patient concerned maybe she didn't bleed enough and she is still cramping. Per chart review, patient was supposed to have a clinic follow up this week. Scheduled appt tomorrow 3/10 @ 345 and informed patient. Patient verbalized understanding. Recommended she take ibuprofen for her pain & to call us back if symptoms worsen. Patient verbalized understanding & had no other questions

## 2016-01-31 ENCOUNTER — Ambulatory Visit (INDEPENDENT_AMBULATORY_CARE_PROVIDER_SITE_OTHER): Payer: Medicaid Other | Admitting: Obstetrics and Gynecology

## 2016-01-31 ENCOUNTER — Encounter: Payer: Self-pay | Admitting: Obstetrics and Gynecology

## 2016-01-31 VITALS — BP 120/72 | HR 72 | Temp 98.0°F | Ht 67.0 in | Wt 195.0 lb

## 2016-01-31 DIAGNOSIS — R102 Pelvic and perineal pain: Secondary | ICD-10-CM | POA: Diagnosis present

## 2016-01-31 NOTE — Progress Notes (Signed)
Patient ID: Michele Avila, female   DOB: 1991-07-08, 25 y.o.   MRN: [id number] 24 yo [id number] presenting today as an SAB follow up. She was diagnosed with a miscarriage on 01/20/16 and was treated with cytotec last week due to possible retained POC on ultrasound. Patient presents today reporting vaginal bleeding for 2 hours following cytotec treatment and nothing since. She reports persistent cramping pain.   Past Medical History  Diagnosis Date  . Seasonal allergies   . Environmental allergies   . Anemia   . Infection     UTI  . Wrist fracture, right   . Asthma     hx chronic bronchitis/exercise induced asthma as pre teen only   Past Surgical History  Procedure Laterality Date  . Cesarean section  2008  . Dilation and curettage of uterus  08/2011  . Induced abortion    . Cesarean section N/A 06/15/2013    Procedure: CESAREAN SECTION;  Surgeon: Allyn Kenner, DO;  Location: [facility];  Service: Obstetrics;  Laterality: N/A;  . Foot surgery  2017   Family History  Problem Relation Age of Onset  . Hypertension Mother   . Cancer Father     liver  . Diabetes Father   . Hypertension Father   . Hypertension Maternal Grandmother   . Kidney disease Maternal Grandmother   . Diabetes Paternal Grandmother   . Hypertension Paternal Grandmother    Social History  Substance Use Topics  . Smoking status: Current Every Day Smoker -- 0.50 packs/day    Types: Cigarettes  . Smokeless tobacco: Never Used     Comment: quit in April 2014  . Alcohol Use: No     Comment: occassionally    ROS See pertinent in HPI  GENERAL: Well-developed, well-nourished female in no acute distress.  ABDOMEN: Soft, nontender, nondistended. No organomegaly. PELVIC: Normal external female genitalia. Vagina is pink and rugated.  Normal discharge. Normal appearing cervix. Uterus is normal in size. No adnexal mass or tenderness. EXTREMITIES: No cyanosis, clubbing, or edema, 2+ distal pulses.  A/P 25 yo s/p miscarriage  on 01/20/16 - Will obtain pelvic ultrasound to rule out retained POC - Discussed medical management with repeat cytotec or surgical management with D&C in the event of retained POC - Patient will be contacted with ultrasound results

## 2016-02-03 ENCOUNTER — Ambulatory Visit (INDEPENDENT_AMBULATORY_CARE_PROVIDER_SITE_OTHER): Payer: Medicaid Other | Admitting: Sports Medicine

## 2016-02-03 ENCOUNTER — Encounter: Payer: Self-pay | Admitting: Sports Medicine

## 2016-02-03 DIAGNOSIS — M79671 Pain in right foot: Secondary | ICD-10-CM

## 2016-02-03 DIAGNOSIS — Z9889 Other specified postprocedural states: Secondary | ICD-10-CM

## 2016-02-03 NOTE — Progress Notes (Signed)
Patient ID: Michele Avila, female   DOB: 1991-01-19, 25 y.o.   MRN: [id number]  Subjective: Michele Avila is a 25 y.o. female patient seen today in office for POV #4 (DOS 12-09-2015), S/P Right dorsal long arm Austin Bunionectomy. Patient has no pain at surgical site, denies calf pain, denies headache, chest pain, shortness of breath, nausea, vomiting, fever, or chills. States that it feels like its getting better. Patient is pregnant; states that she is following up with her OBGYN; may have to have procedure completed because of abortion. No other issues noted.   Patient Active Problem List   Diagnosis Date Noted  . Incomplete abortion with delayed or excessive hemorrhage 01/23/2016   Current Outpatient Prescriptions on File Prior to Visit  Medication Sig Dispense Refill  . ipratropium (ATROVENT) 0.06 % nasal spray Place 2 sprays into both nostrils 4 (four) times daily. (Patient not taking: Reported on 01/11/2016) 15 mL 0  . methylergonovine (METHERGINE) 0.2 MG tablet Take 1 tablet (0.2 mg total) by mouth 4 (four) times daily. (Patient not taking: Reported on 01/23/2016) 4 tablet 0  . misoprostol (CYTOTEC) 200 MCG tablet 2 tablet between cheek and gum and 2 vaginally single dose 4 tablet 0  . ondansetron (ZOFRAN ODT) 8 MG disintegrating tablet Take 1 tablet (8 mg total) by mouth every 8 (eight) hours as needed for nausea or vomiting. 20 tablet 0  . oxyCODONE-acetaminophen (PERCOCET/ROXICET) 5-325 MG tablet Take 1-2 tablets by mouth every 6 (six) hours as needed for severe pain. 15 tablet 0  . promethazine (PHENERGAN) 25 MG tablet Take 1 tablet (25 mg total) by mouth every 6 (six) hours as needed for nausea or vomiting. (Patient not taking: Reported on 01/23/2016) 30 tablet 0   No current facility-administered medications on file prior to visit.   Allergies  Allergen Reactions  . Latex Hives and Rash   Objective: General: No acute distress, AAOx3  Right foot: Incision healed at surgical site, mild  scaly skin and mild swelling to forefoot on right foot, no erythema, no warmth, no drainage, no signs of infection noted, Capillary fill time <3 seconds in all digits, gross sensation present via light touch to right foot. No pain or crepitation with range of motion right foot.  No pain with calf compression.   Assessment and Plan:  Problem List Items Addressed This Visit    None    Visit Diagnoses    Status post right foot surgery    -  Primary    R long arm austin bunionectomy, 12-09-15, healing well     Right foot pain        Improving       -Patient seen and evaluated -Recommend scar gel or vit E oil daily at surgical site -Continue with compression stockinet  to assist with edema control -Continue with normal good supportive shoes daily  -Advised patient to do activities to tolerance  -Advised patient to ice and elevate as necessary  -Patient to cont f/u with OBGYN -Patient may resume work but recommend a job position for now with limited standing and walking since she is still healing from her surgery -Patient to return in 8 weeks for follow up office visit. In the meantime, patient to call office if any issues or problems arise.   Landis Martins, DPM

## 2016-02-05 ENCOUNTER — Other Ambulatory Visit: Payer: Self-pay | Admitting: General Practice

## 2016-02-05 ENCOUNTER — Ambulatory Visit (HOSPITAL_COMMUNITY)
Admission: RE | Admit: 2016-02-05 | Discharge: 2016-02-05 | Disposition: A | Discharge status: Home / Self Care | Payer: Medicaid Other | Source: Ambulatory Visit | Attending: Obstetrics and Gynecology | Admitting: Obstetrics and Gynecology

## 2016-02-05 DIAGNOSIS — R102 Pelvic and perineal pain: Secondary | ICD-10-CM | POA: Diagnosis present

## 2016-02-05 IMAGING — US US TRANSVAGINAL NON-OB
1 series · 15 of 25 positions shown · non-contrast
Comparison: [DATE] obstetric scan.

CLINICAL DATA: 24-year-old female status post spontaneous abortion
diagnosed [DATE], treated with Cytotec for possible retained
products on sonography, with persistent pelvic cramping/pain.

EXAM:
ULTRASOUND PELVIS TRANSVAGINAL
TECHNIQUE: Transvaginal ultrasound examination of the pelvis was performed
including evaluation of the uterus, ovaries, adnexal regions, and
pelvic cul-de-sac.

[Series 1: us transvaginal non-ob · 67 acquisitions, 15 frames shown]
[im 1/67]
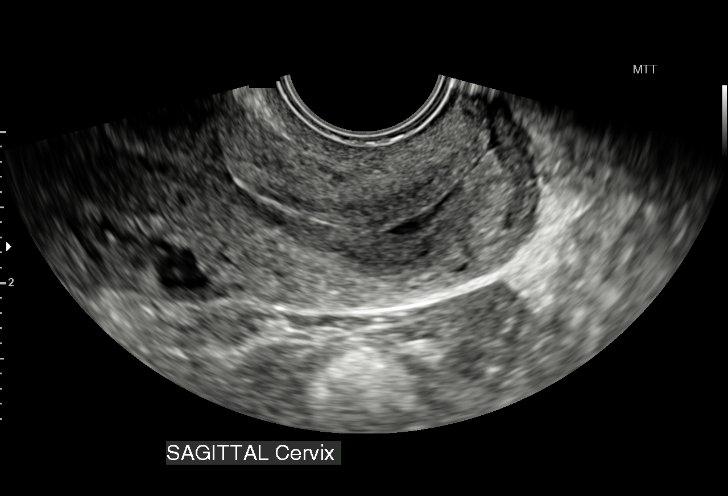
[im 6/67]
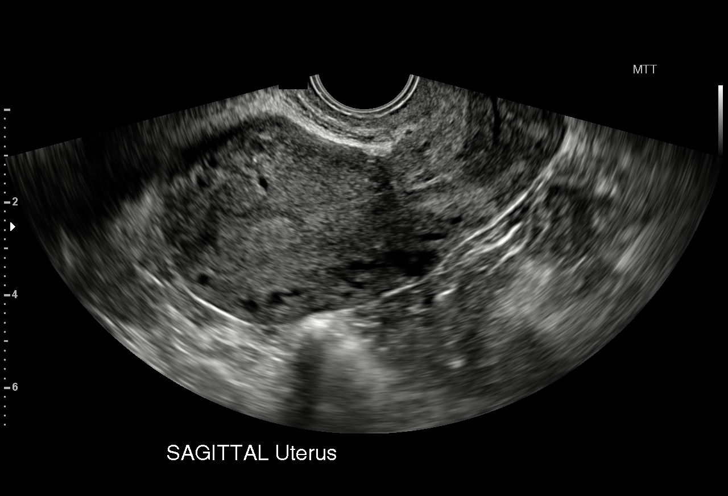
[im 12/67]
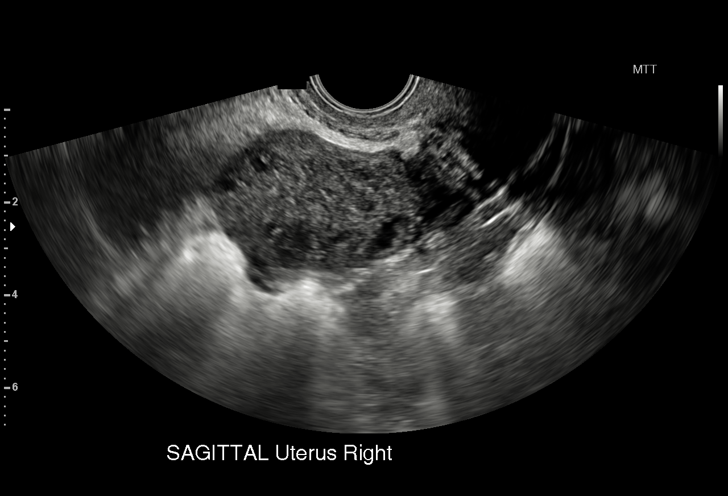
[im 14/67]
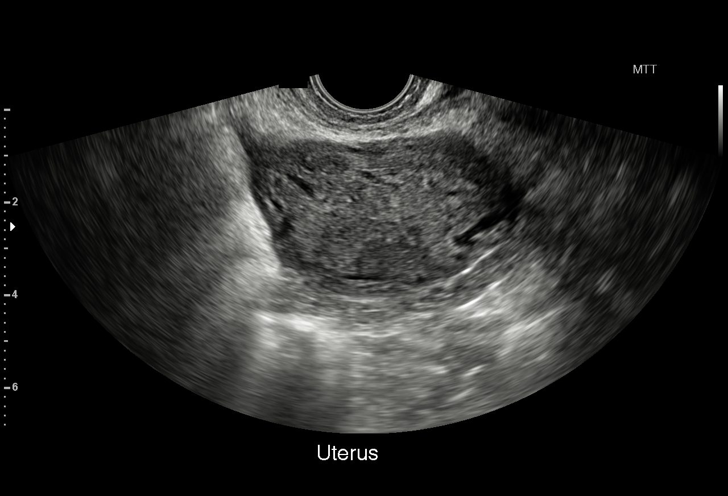
[im 20/67]
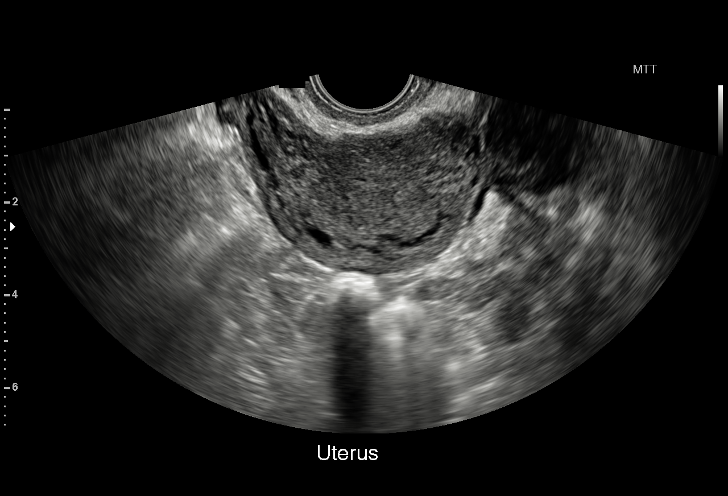
[im 25/67]
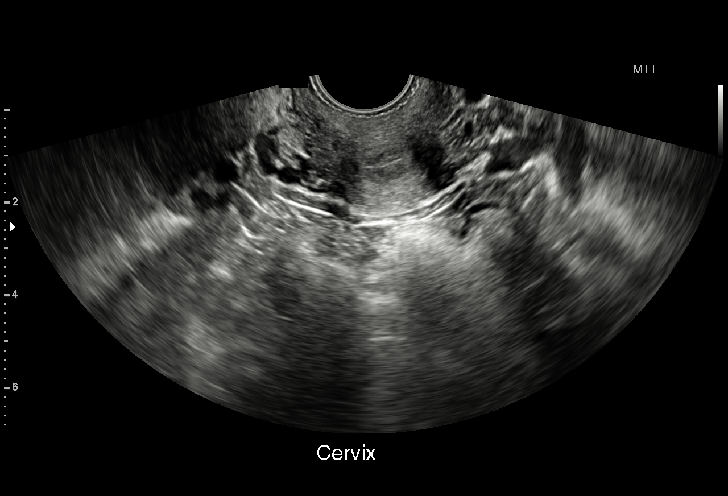
[im 28/67]
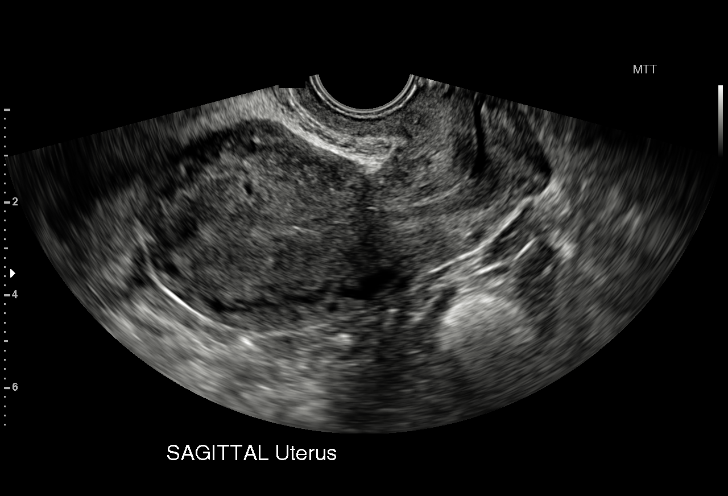
[im 34/67]
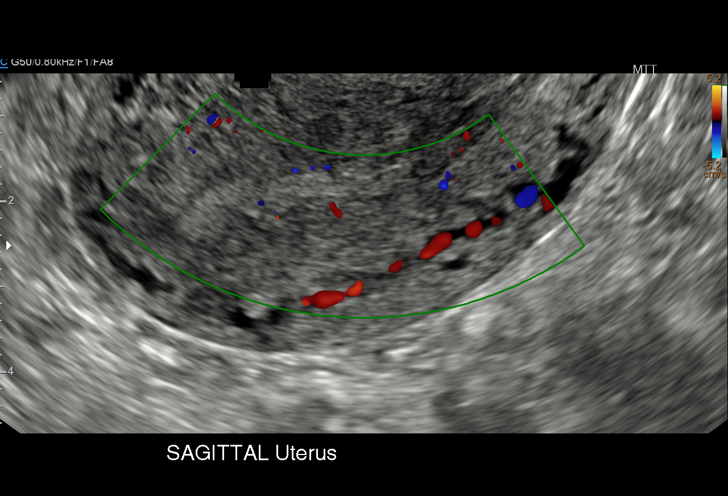
[im 39/67]
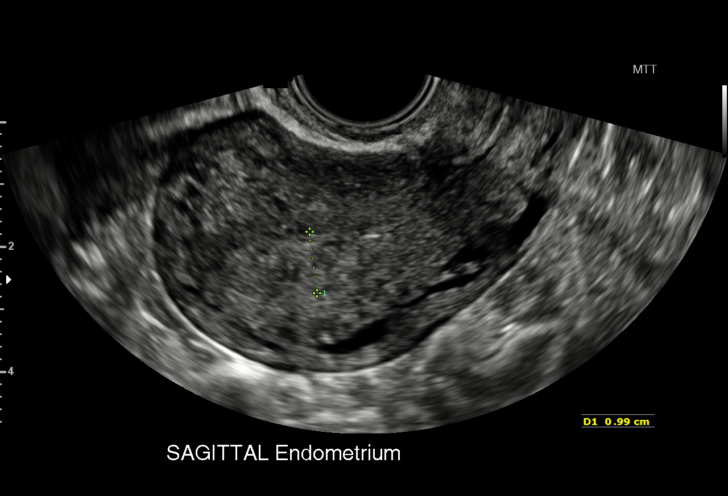
[im 42/67]
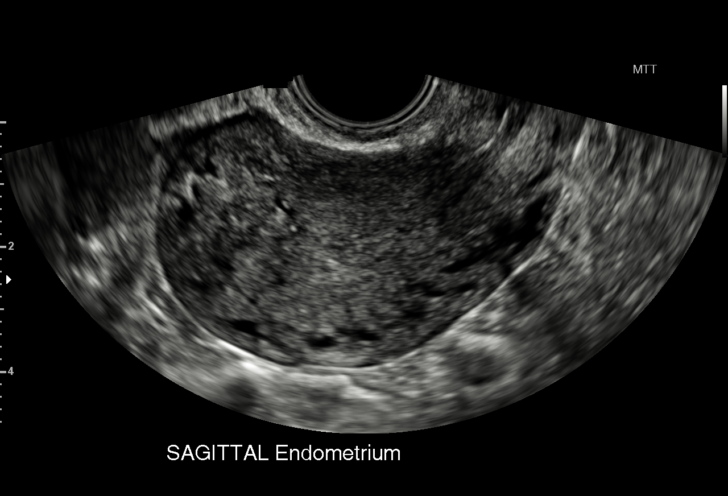
[im 47/67]
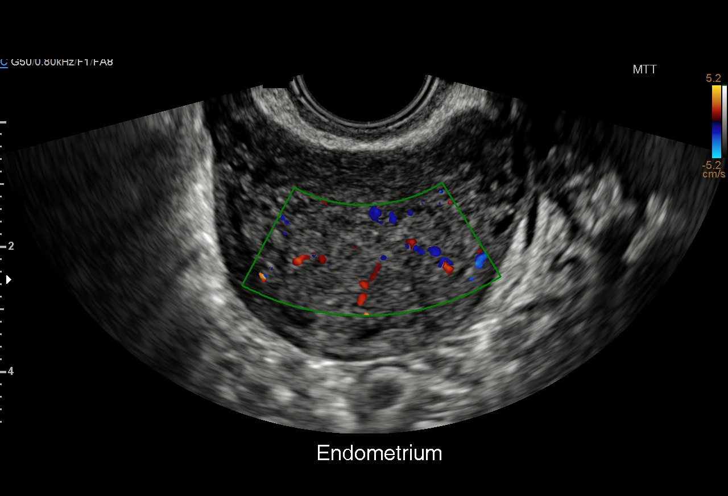
[im 53/67]
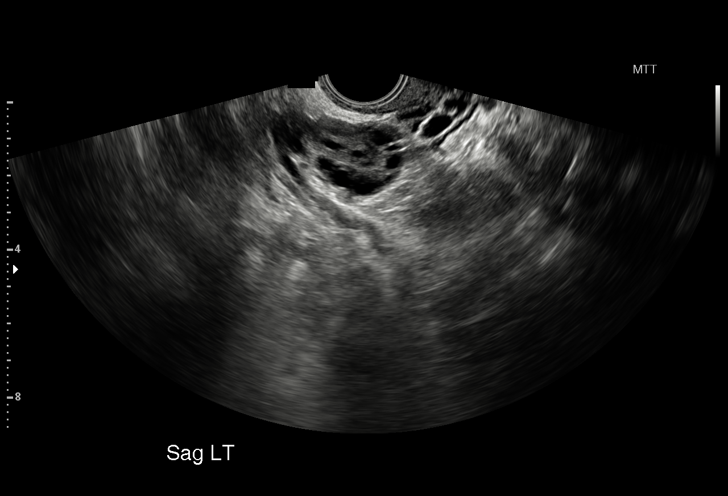
[im 56/67]
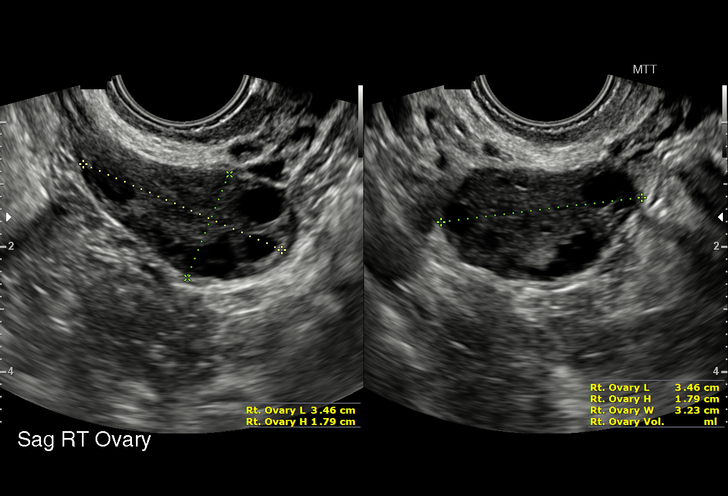
[im 61/67]
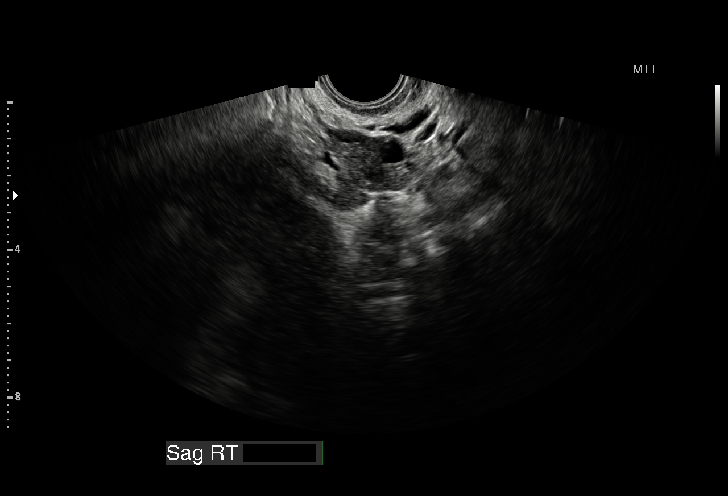
[im 67/67]
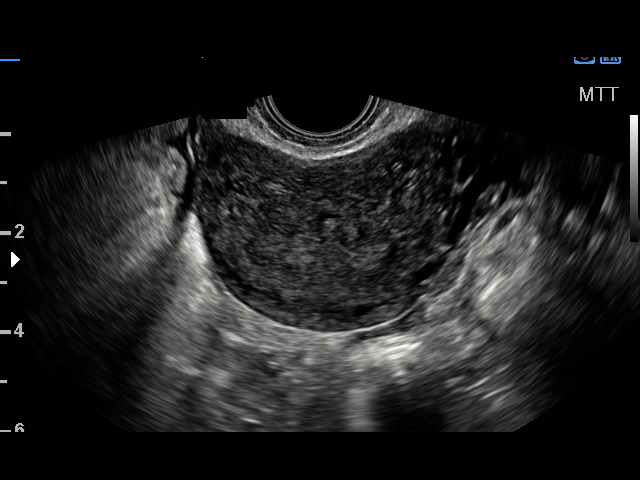

[15 of 25 positions shown; findings below may reference images not displayed]

FINDINGS: Uterus

Measurements: 8.9 x 4.6 x 5.1 cm. The anteverted anteflexed uterus
is normal in size and configuration. A normal Cesarean scar is seen
in the anterior lower uterine segment. There are no uterine fibroids
or other myometrial abnormalities.

Endometrium

The myometrial-endometrial interface is somewhat indistinct. There
is mild heterogeneity of the endometrium, which measures
approximately 6 mm in bilayer thickness. No endometrial cavity fluid
or focal endometrial mass detected. No definite hypervascularity
within the endometrium at color Doppler.

Right ovary

Measurements: 3.5 x 1.8 x 3.2 cm. Normal appearance/no adnexal mass.

Left ovary

Measurements: 2.7 x 2.0 x 2.5 cm. Normal appearance/no adnexal mass.

Other findings:  No abnormal free fluid.
IMPRESSION: 1. Mildly heterogeneous endometrium measuring 6 mm in thickness. No
endometrial cavity fluid. No focal endometrial mass. No evidence of
retained products of conception.
2. No uterine fibroids.
3. Normal ovaries.  No adnexal abnormality.

## 2016-02-10 ENCOUNTER — Telehealth: Payer: Self-pay | Admitting: *Deleted

## 2016-02-10 NOTE — Telephone Encounter (Signed)
Per message from Dr. Elly Modena called patient and notified her that her ultrasound was normal, did not show products of conception. I also asked if she was desiring to discuss contraception- which she states she is . I informed her I would send registars a message to calll her with an appointment . She voices understanding.

## 2016-03-02 ENCOUNTER — Ambulatory Visit (INDEPENDENT_AMBULATORY_CARE_PROVIDER_SITE_OTHER): Payer: Medicaid Other | Admitting: Obstetrics and Gynecology

## 2016-03-02 ENCOUNTER — Encounter: Payer: Self-pay | Admitting: Obstetrics and Gynecology

## 2016-03-02 VITALS — BP 114/72 | HR 71 | Temp 98.3°F | Wt 196.0 lb

## 2016-03-02 DIAGNOSIS — Z30430 Encounter for insertion of intrauterine contraceptive device: Secondary | ICD-10-CM

## 2016-03-02 DIAGNOSIS — Z975 Presence of (intrauterine) contraceptive device: Secondary | ICD-10-CM | POA: Insufficient documentation

## 2016-03-02 DIAGNOSIS — Z309 Encounter for contraceptive management, unspecified: Secondary | ICD-10-CM

## 2016-03-02 LAB — POCT URINALYSIS DIP (DEVICE)
Bilirubin Urine: NEGATIVE
Glucose, UA: NEGATIVE mg/dL
Hgb urine dipstick: NEGATIVE
Ketones, ur: NEGATIVE mg/dL
Leukocytes, UA: NEGATIVE
Nitrite: NEGATIVE
Protein, ur: NEGATIVE mg/dL
Specific Gravity, Urine: 1.025 (ref 1.005–1.030)
Urobilinogen, UA: 1 mg/dL (ref 0.0–1.0)
pH: 7.5 (ref 5.0–8.0)

## 2016-03-02 LAB — POCT PREGNANCY, URINE: Preg Test, Ur: NEGATIVE

## 2016-03-02 MED ORDER — ACETAMINOPHEN-CODEINE #3 300-30 MG PO TABS: 1.0000 | ORAL_TABLET | ORAL | Status: DC | PRN

## 2016-03-02 MED ORDER — MISOPROSTOL 200 MCG PO TABS: ORAL_TABLET | ORAL | Status: DC

## 2016-03-02 NOTE — Patient Instructions (Signed)
Miscarriage A miscarriage is the loss of an unborn baby (fetus) before the 20th week of pregnancy. The cause is often unknown.  HOME CARE  You may need to stay in bed (bed rest), or you may be able to do light activity. Go about activity as told by your doctor.  Have help at home.  Write down how many pads you use each day. Write down how soaked they are.  Do not use tampons. Do not wash out your vagina (douche) or have sex (intercourse) until your doctor approves.  Only take medicine as told by your doctor.  Do not take aspirin.  Keep all doctor visits as told.  If you or your partner have problems with grieving, talk to your doctor. You can also try counseling. Give yourself time to grieve before trying to get pregnant again. GET HELP RIGHT AWAY IF:  You have bad cramps or pain in your back or belly (abdomen).  You have a fever.  You pass large clumps of blood (clots) from your vagina that are walnut-sized or larger. Save the clumps for your doctor to see.  You pass large amounts of tissue from your vagina. Save the tissue for your doctor to see.  You have more bleeding.  You have thick, bad-smelling fluid (discharge) coming from the vagina.  You get lightheaded, weak, or you pass out (faint).  You have chills. MAKE SURE YOU:  Understand these instructions.  Will watch your condition.  Will get help right away if you are not doing well or get worse.   This information is not intended to replace advice given to you by your health care provider. Make sure you discuss any questions you have with your health care provider.   Document Released: 02/01/2012 Document Reviewed: 02/01/2012 Elsevier Interactive Patient Education [employer].

## 2016-03-02 NOTE — Progress Notes (Signed)
CLINIC ENCOUNTER NOTE  History:  25 y.o. [id number] here today for contraceptive mgmt. SAB last month now completed. Interested in [place]. No history dvt, no history liver disease.  Past Medical History  Diagnosis Date  . Seasonal allergies   . Environmental allergies   . Anemia   . Infection     UTI  . Wrist fracture, right   . Asthma     hx chronic bronchitis/exercise induced asthma as pre teen only    Past Surgical History  Procedure Laterality Date  . Cesarean section  2008  . Dilation and curettage of uterus  08/2011  . Induced abortion    . Cesarean section N/A 06/15/2013    Procedure: CESAREAN SECTION;  Surgeon: Allyn Kenner, DO;  Location: [facility];  Service: Obstetrics;  Laterality: N/A;  . Foot surgery  2017    The following portions of the patient's history were reviewed and updated as appropriate: allergies, current medications, past family history, past medical history, past social history, past surgical history and problem list.    Review of Systems:  See above; comprehensive review of systems was otherwise negative.  Objective:  Physical Exam BP 114/72 mmHg  Pulse 71  Temp(Src) 98.3 F (36.8 C) (Oral)  Wt 196 lb (88.905 kg)  LMP 12/26/2014 (Approximate) CONSTITUTIONAL: Well-developed, well-nourished female in no acute distress.  HENT:  Normocephalic, atraumatic SKIN: Skin is warm and dry.  [place]: Alert  PSYCHIATRIC: Normal mood and affect.  CARDIOVASCULAR: Normal heart rate noted RESPIRATORY: Effort and breath sounds normal, no problems with respiration noted ABDOMEN: Soft, no distention noted.  No tenderness, rebound or guarding.  PELVIC: normal vagina, normal cervix  Procedure - iud Bimanual Speculum Betadine x2 Tenaculum anterior lip Sound to 8 cm Mirena inserted according to manufacturer's specifications Tenaculum removed, no bleeding Pt toelrated procedure well, no complications   Labs and Imaging US Transvaginal Non-ob  02/05/2016   CLINICAL DATA:  25 year old female status post spontaneous abortion diagnosed 01/20/2016, treated with Cytotec for possible retained products on sonography, with persistent pelvic cramping/pain. EXAM: ULTRASOUND PELVIS TRANSVAGINAL TECHNIQUE: Transvaginal ultrasound examination of the pelvis was performed including evaluation of the uterus, ovaries, adnexal regions, and pelvic cul-de-sac. COMPARISON:  01/20/2016 obstetric scan. FINDINGS: Uterus Measurements: 8.9 x 4.6 x 5.1 cm. The anteverted anteflexed uterus is normal in size and configuration. A normal Cesarean scar is seen in the anterior lower uterine segment. There are no uterine fibroids or other myometrial abnormalities. Endometrium The myometrial-endometrial interface is somewhat indistinct. There is mild heterogeneity of the endometrium, which measures approximately 6 mm in bilayer thickness. No endometrial cavity fluid or focal endometrial mass detected. No definite hypervascularity within the endometrium at color Doppler. Right ovary Measurements: 3.5 x 1.8 x 3.2 cm. Normal appearance/no adnexal mass. Left ovary Measurements: 2.7 x 2.0 x 2.5 cm. Normal appearance/no adnexal mass. Other findings:  No abnormal free fluid. IMPRESSION: 1. Mildly heterogeneous endometrium measuring 6 mm in thickness. No endometrial cavity fluid. No focal endometrial mass. No evidence of retained products of conception. 2. No uterine fibroids. 3. Normal ovaries.  No adnexal abnormality. Electronically Signed   By: Ilona Sorrel M.D.   On: 02/05/2016 11:51    Assessment & Plan:   # Contraceptive mgmt - iud placed today, f/u 4 weeks string check      Tracey Hermance B. [place] Luber, [place] for [employer], [place]

## 2016-03-02 NOTE — Progress Notes (Deleted)
CLINIC ENCOUNTER NOTE  History:  25 y.o. [id number] here today for SAB f/u. Completed w/ medical mgmt. U/s last month confirmed no retained products. Had light period since. No recent cramping or fevers.  No sex yet. Lots of spotting/bleeding in past with nexplanon.   Past Medical History  Diagnosis Date  . Seasonal allergies   . Environmental allergies   . Anemia   . Infection     UTI  . Wrist fracture, right   . Asthma     hx chronic bronchitis/exercise induced asthma as pre teen only    Past Surgical History  Procedure Laterality Date  . Cesarean section  2008  . Dilation and curettage of uterus  08/2011  . Induced abortion    . Cesarean section N/A 06/15/2013    Procedure: CESAREAN SECTION;  Surgeon: Allyn Kenner, DO;  Location: [facility];  Service: Obstetrics;  Laterality: N/A;  . Foot surgery  2017    The following portions of the patient's history were reviewed and updated as appropriate: allergies, current medications, past family history, past medical history, past social history, past surgical history and problem list.   Health Maintenance:  Normal pap and negative HRHPV on ***.  Normal mammogram on ***.   Review of Systems:  See above; comprehensive review of systems was otherwise negative.  Objective:  Physical Exam BP 114/72 mmHg  Pulse 71  Temp(Src) 98.3 F (36.8 C) (Oral)  Wt 196 lb (88.905 kg)  LMP 12/26/2014 (Approximate) CONSTITUTIONAL: Well-developed, well-nourished female in no acute distress.  HENT:  Normocephalic, atraumatic SKIN: Skin is warm and dry.  [place]: Alert  PSYCHIATRIC: Normal mood and affect.  CARDIOVASCULAR: Normal heart rate noted RESPIRATORY: Effort and breath sounds normal, no problems with respiration noted ABDOMEN: Soft, no distention noted.  No tenderness, rebound or guarding.  PELVIC: Deferred***Normal appearing external genitalia; normal appearing vaginal mucosa and cervix.  Normal appearing discharge.  Normal uterine size,  no other palpable masses, no uterine or adnexal tenderness.   Labs and Imaging US Transvaginal Non-ob  02/05/2016  CLINICAL DATA:  25 year old female status post spontaneous abortion diagnosed 01/20/2016, treated with Cytotec for possible retained products on sonography, with persistent pelvic cramping/pain. EXAM: ULTRASOUND PELVIS TRANSVAGINAL TECHNIQUE: Transvaginal ultrasound examination of the pelvis was performed including evaluation of the uterus, ovaries, adnexal regions, and pelvic cul-de-sac. COMPARISON:  01/20/2016 obstetric scan. FINDINGS: Uterus Measurements: 8.9 x 4.6 x 5.1 cm. The anteverted anteflexed uterus is normal in size and configuration. A normal Cesarean scar is seen in the anterior lower uterine segment. There are no uterine fibroids or other myometrial abnormalities. Endometrium The myometrial-endometrial interface is somewhat indistinct. There is mild heterogeneity of the endometrium, which measures approximately 6 mm in bilayer thickness. No endometrial cavity fluid or focal endometrial mass detected. No definite hypervascularity within the endometrium at color Doppler. Right ovary Measurements: 3.5 x 1.8 x 3.2 cm. Normal appearance/no adnexal mass. Left ovary Measurements: 2.7 x 2.0 x 2.5 cm. Normal appearance/no adnexal mass. Other findings:  No abnormal free fluid. IMPRESSION: 1. Mildly heterogeneous endometrium measuring 6 mm in thickness. No endometrial cavity fluid. No focal endometrial mass. No evidence of retained products of conception. 2. No uterine fibroids. 3. Normal ovaries.  No adnexal abnormality. Electronically Signed   By: Ilona Sorrel M.D.   On: 02/05/2016 11:51    Assessment & Plan:   # ***  Routine preventative health maintenance measures emphasized.     Pearlie Nies B. [place], [place] for [employer], [facility]

## 2016-03-04 ENCOUNTER — Encounter: Payer: Self-pay | Admitting: *Deleted

## 2016-03-30 ENCOUNTER — Encounter: Payer: Self-pay | Admitting: Obstetrics & Gynecology

## 2016-03-30 ENCOUNTER — Other Ambulatory Visit (HOSPITAL_COMMUNITY)
Admission: RE | Admit: 2016-03-30 | Discharge: 2016-03-30 | Disposition: A | Discharge status: Home / Self Care | Payer: Medicaid Other | Source: Ambulatory Visit | Attending: Obstetrics & Gynecology | Admitting: Obstetrics & Gynecology

## 2016-03-30 ENCOUNTER — Ambulatory Visit (INDEPENDENT_AMBULATORY_CARE_PROVIDER_SITE_OTHER): Payer: Medicaid Other | Admitting: Obstetrics & Gynecology

## 2016-03-30 VITALS — BP 123/63 | HR 74 | Ht 67.0 in | Wt 204.0 lb

## 2016-03-30 DIAGNOSIS — Z01419 Encounter for gynecological examination (general) (routine) without abnormal findings: Secondary | ICD-10-CM | POA: Insufficient documentation

## 2016-03-30 DIAGNOSIS — Z124 Encounter for screening for malignant neoplasm of cervix: Secondary | ICD-10-CM | POA: Diagnosis not present

## 2016-03-30 DIAGNOSIS — Z113 Encounter for screening for infections with a predominantly sexual mode of transmission: Secondary | ICD-10-CM

## 2016-03-30 DIAGNOSIS — Z975 Presence of (intrauterine) contraceptive device: Secondary | ICD-10-CM | POA: Diagnosis not present

## 2016-03-30 DIAGNOSIS — Z30431 Encounter for routine checking of intrauterine contraceptive device: Secondary | ICD-10-CM | POA: Diagnosis present

## 2016-03-30 NOTE — Patient Instructions (Signed)
Return to clinic for any scheduled appointments or for any gynecologic concerns as needed.   

## 2016-03-30 NOTE — Progress Notes (Signed)
     GYNECOLOGY CLINIC PROGRESS NOTE  History:  25 y.o. [id number] here today for today for IUD string check; Mirena IUD was placed  03/02/2016. No complaints about the IUD, no concerning side effects.  The following portions of the patient's history were reviewed and updated as appropriate: allergies, current medications, past family history, past medical history, past social history, past surgical history and problem list. Unable to see when last pap smear was done, patient does not remember  Review of Systems:  Pertinent items are noted in HPI.  Objective:  Physical Exam Blood pressure 123/63, pulse 74, height 5\' 7"  (1.702 m), weight 204 lb (92.534 kg), last menstrual period 12/26/2014. CONSTITUTIONAL: Well-developed, well-nourished female in no acute distress.  HENT:  Normocephalic, atraumatic. External right and left ear normal. Oropharynx is clear and moist EYES: Conjunctivae and EOM are normal. Pupils are equal, round, and reactive to light. No scleral icterus.  NECK: Normal range of motion, supple, no masses CARDIOVASCULAR: Normal heart rate noted RESPIRATORY: Effort and breath sounds normal, no problems with respiration noted ABDOMEN: Soft, no distention noted.   PELVIC: Normal appearing external genitalia; normal appearing vaginal mucosa and cervix.  IUD strings visualized, about 1 cm in length outside cervix.  Pap smear done.  Assessment & Plan:  Normal IUD check. Patient to keep IUD in place for five years; can come in for removal if she desires pregnancy within the next five years. Will follow up pap smear results; other routine preventative health maintenance measures emphasized.    Verita Schneiders, MD, [place] Attending Obstetrician & Gynecologist, [place] for [facility]

## 2016-04-01 LAB — CYTOLOGY - PAP

## 2016-04-06 ENCOUNTER — Ambulatory Visit (INDEPENDENT_AMBULATORY_CARE_PROVIDER_SITE_OTHER): Payer: Medicaid Other | Admitting: Sports Medicine

## 2016-04-06 ENCOUNTER — Encounter: Payer: Self-pay | Admitting: Sports Medicine

## 2016-04-06 DIAGNOSIS — M79671 Pain in right foot: Secondary | ICD-10-CM

## 2016-04-06 DIAGNOSIS — Z9889 Other specified postprocedural states: Secondary | ICD-10-CM

## 2016-04-06 NOTE — Progress Notes (Signed)
Patient ID: Michele Avila, female   DOB: 10-28-1991, 25 y.o.   MRN: [id number] Subjective: Michele Avila is a 25 y.o. female patient seen today in office for POV #5 (DOS 12-09-2015), S/P Right dorsal long arm [place] Bunionectomy. Patient has no pain at surgical site, denies calf pain, denies headache, chest pain, shortness of breath, nausea, vomiting, fever, or chills. States that it feels better; she is able to fit more shoes and has more motion at the big toe. No other issues noted.   Patient Active Problem List   Diagnosis Date Noted  . Mirena intrauterine device in place 03/02/2016   No current outpatient prescriptions on file prior to visit.   No current facility-administered medications on file prior to visit.   Allergies  Allergen Reactions  . Latex Hives and Rash   Objective: General: No acute distress, AAOx3  Right foot: Incision healed at surgical site, no swelling to forefoot on right foot, no erythema, no warmth, no drainage, no signs of infection noted, Capillary fill time <3 seconds in all digits, gross sensation present via light touch to right foot. No pain or crepitation with range of motion right foot.  No pain with calf compression.   Assessment and Plan:  Problem List Items Addressed This Visit    None    Visit Diagnoses    Status post right foot surgery    -  Primary    Long arm [place], 12-09-15    Right foot pain          -Patient seen and evaluated -Recommend scar gel daily at surgical site -Continue with compression stockinet  to assist with edema control as needed especially after work or long period of standing -Continue with normal good supportive shoes daily  -Advised patient to do activities to tolerance  -Advised patient to ice and elevate as necessary  -Patient to return in 16 weeks for follow up office visit. Will xray at this encounter. In the meantime, patient to call office if any issues or problems arise.   Landis Martins, DPM

## 2016-05-12 ENCOUNTER — Encounter: Payer: Self-pay | Admitting: Obstetrics & Gynecology

## 2016-05-27 ENCOUNTER — Ambulatory Visit (HOSPITAL_COMMUNITY)
Admission: EM | Admit: 2016-05-27 | Discharge: 2016-05-27 | Disposition: A | Discharge status: Home / Self Care | Payer: Medicaid Other | Attending: Emergency Medicine | Admitting: Emergency Medicine

## 2016-05-27 ENCOUNTER — Encounter (HOSPITAL_COMMUNITY): Payer: Self-pay | Admitting: Emergency Medicine

## 2016-05-27 DIAGNOSIS — G43909 Migraine, unspecified, not intractable, without status migrainosus: Secondary | ICD-10-CM | POA: Diagnosis not present

## 2016-05-27 DIAGNOSIS — T673XXA Heat exhaustion, anhydrotic, initial encounter: Secondary | ICD-10-CM | POA: Diagnosis not present

## 2016-05-27 MED ORDER — KETOROLAC TROMETHAMINE 30 MG/ML IJ SOLN
30.0000 mg | Freq: Once | INTRAMUSCULAR | Status: AC
  Administered 2016-05-27: 30 mg via INTRAVENOUS

## 2016-05-27 MED ORDER — SODIUM CHLORIDE 0.9 % IV SOLN
Freq: Once | INTRAVENOUS | Status: AC
  Administered 2016-05-27: 13:00:00 via INTRAVENOUS

## 2016-05-27 MED ORDER — KETOROLAC TROMETHAMINE 30 MG/ML IJ SOLN: 30.0000 mg | Freq: Once | INTRAMUSCULAR | Status: DC

## 2016-05-27 MED ORDER — KETOROLAC TROMETHAMINE 30 MG/ML IJ SOLN
INTRAMUSCULAR | Status: AC
  Filled 2016-05-27: qty 1

## 2016-05-27 NOTE — ED Provider Notes (Signed)
CSN: [id number]     Arrival date & time 05/27/16  1042 History   First MD Initiated Contact with Patient 05/27/16 1146     Chief Complaint  Patient presents with  . Migraine   (Consider location/radiation/quality/duration/timing/severity/associated sxs/prior Treatment) HPI History obtained from patient: Location:  head Context/Duration: Feels drained and weak after being in the sun most of yesterday with minimal fluid intake, 2 bottles of water.   Severity: 2  Quality:feels weak, throbbing Timing:           constant Home Treatment: had some water and tylenol Associated symptoms:  Headache both temples Family History:hypertension both parents.    Past Medical History  Diagnosis Date  . Seasonal allergies   . Environmental allergies   . Anemia   . Infection     UTI  . Wrist fracture, right   . Asthma     hx chronic bronchitis/exercise induced asthma as pre teen only   Past Surgical History  Procedure Laterality Date  . Cesarean section  2008  . Dilation and curettage of uterus  08/2011  . Induced abortion    . Cesarean section N/A 06/15/2013    Procedure: CESAREAN SECTION;  Surgeon: Allyn Kenner, DO;  Location: [facility];  Service: Obstetrics;  Laterality: N/A;  . Foot surgery  2017   Family History  Problem Relation Age of Onset  . Hypertension Mother   . Cancer Father     liver  . Diabetes Father   . Hypertension Father   . Hypertension Maternal Grandmother   . Kidney disease Maternal Grandmother   . Diabetes Paternal Grandmother   . Hypertension Paternal Grandmother    Social History  Substance Use Topics  . Smoking status: Current Every Day Smoker -- 0.50 packs/day    Types: Cigarettes  . Smokeless tobacco: Never Used     Comment: quit in April 2014  . Alcohol Use: No     Comment: occassionally    OB History    Gravida Para Term Preterm AB TAB SAB Ectopic Multiple Living   5 2 2  3 2    2      Review of Systems  Denies:  NAUSEA, ABDOMINAL PAIN, CHEST  PAIN, CONGESTION, DYSURIA, SHORTNESS OF BREATH  Allergies  Latex  Home Medications   Prior to Admission medications   Not on File   Meds Ordered and Administered this Visit   Medications  0.9 %  sodium chloride infusion ( Intravenous Stopped 05/27/16 1319)  ketorolac (TORADOL) 30 MG/ML injection 30 mg (30 mg Intravenous Given 05/27/16 1241)    BP 116/76 mmHg  Pulse 73  Temp(Src) 98.4 F (36.9 C) (Oral)  Resp 18  SpO2 99%  LMP 05/27/2016 (Exact Date) No data found.   Physical Exam NURSES NOTES AND VITAL SIGNS REVIEWED. CONSTITUTIONAL: Well developed, well nourished, no acute distress HEENT: normocephalic, atraumatic EYES: Conjunctiva normal NECK:normal ROM, supple, no adenopathy PULMONARY:No respiratory distress, normal effort ABDOMINAL: Soft, ND, NT BS+, No CVAT MUSCULOSKELETAL: Normal ROM of all extremities,  SKIN: warm and dry without rash PSYCHIATRIC: Mood and affect, behavior are normal  ED Course  Procedures (including critical care time)  Labs Review Labs Reviewed - No data to display  Imaging Review No results found.   Visual Acuity Review  Right Eye Distance:   Left Eye Distance:   Bilateral Distance:    Right Eye Near:   Left Eye Near:    Bilateral Near:       Administration of fluids and  toradol and patient states she is feeling so much better.   MDM   1. Heat exhaustion due to water depletion, initial encounter     Patient is reassured that there are no issues that require transfer to higher level of care at this time or additional tests. Patient is advised to continue home symptomatic treatment. Patient is advised that if there are new or worsening symptoms to attend the emergency department, contact primary care provider, or return to UC. Instructions of care provided discharged home in stable condition.    THIS NOTE WAS GENERATED USING A VOICE RECOGNITION SOFTWARE PROGRAM. ALL REASONABLE EFFORTS  WERE MADE TO PROOFREAD THIS DOCUMENT  FOR ACCURACY.  I have verbally reviewed the discharge instructions with the patient. A printed AVS was given to the patient.  All questions were answered prior to discharge.      Konrad Felix, PA 05/27/16 1438

## 2016-05-27 NOTE — Discharge Instructions (Signed)
Heat Exhaustion Information WHAT IS HEAT EXHAUSTION? Heat exhaustion happens when your body gets overheated from hot weather or from exercise. Heat exhaustion makes the temperature of your skin and body go up. Your body cools itself by sweating. If you do not drink enough water to replace what you sweat, you lose too much water and salt. This makes it harder for your body to produce more sweat. When you do not sweat enough, your body cannot cool down, and heat exhaustion may result. Heat exhaustion can lead to heatstroke, which is a more serious illness. WHO IS AT RISK FOR HEAT EXHAUSTION? Anyone can get heat exhaustion. However, heat exhaustion is more likely when you are exercising or doing a physical activity. It is also more likely when you are in hot and humid weather or bright sunshine. Heat exhaustion is also more likely to develop in:  People who are age 37 or older.  Children.  People who have a medical condition such as heart disease, poor circulation, sickle cell disease, or high blood pressure.  People who have a fever.  People who are very overweight (obese).  People who are dehydrated from:  Drinking alcohol or caffeine.  Taking certain medicines, such as diuretics or stimulants. WHAT ARE THE SYMPTOMS OF HEAT EXHAUSTION? Symptoms of heat exhaustion include:  A body temperature of up to 104F (40C).  Moist, cool, and clammy skin.  Dizziness.  Headache.  Nausea.  Fatigue.  Thirst.  Dark-colored urine.  Rapid pulse or heartbeat.  Weakness.  Muscle cramps.  Confusion.  Fainting. WHAT SHOULD I DO IF I THINK I HAVE HEAT EXHAUSTION? If you think that you have heat exhaustion, call your health care provider. Follow his or her instructions. You should also:  Call a friend or a family member and ask someone to stay with you.  Move to a cooler location, such as:  Into the shade.  In front of a fan.  Someplace that has air conditioning.  Lie down  and rest.  Slowly drink nonalcoholic, caffeine-free fluids.  Take off any extra clothing or tight-fitting clothes.  Take a cool bath or shower, if possible. If you do not have access to a bath or shower, dab or mist cool water on your skin. WHY IS IT IMPORTANT TO TREAT HEAT EXHAUSTION? It is extremely important to take care of yourself and treat heat exhaustion as soon as possible. Untreated heat exhaustion can turn into heatstroke. Symptoms of heatstroke include:  A body temperature of 104F (40C) or higher.  Hot, red skin that may be dry or moist.  Severe headache.  Nausea and vomiting.  Muscle weakness and cramping.  Confusion.  Rapid breathing.  Fainting.  Seizure. These symptoms may represent a serious problem that is an emergency. Do not wait to see if the symptoms will go away. Get medical help right away. Call your local emergency services (911 in the [place] Do not drive yourself to the hospital. Heatstroke is a life-threatening condition that requires urgent medical treatment. Do not treat heatstroke at home. Heatstroke should be treated by a health care professional and may require hospitalization. At the hospital, you may need to receive fluids through an IV tube:  If you cannot drink any fluids.  If you vomit any fluids that you drink.  If your symptoms do not get better after one hour.  If your symptoms get worse after one hour. HOW CAN I PREVENT HEAT EXHAUSTION?  Avoid outdoor activities on very hot or humid days.  Do not exercise or do other physical activity when you are not feeling well.  Drink plenty of nonalcoholic and caffeine-free fluids before and during physical activity.  Take frequent breaks for rest during physical activity.  Wear light-colored, loose-fitting, and lightweight clothing in the heat.  Wear a hat and use sunscreen when exercising outdoors.  Avoid being outside during the hottest times of the day.  Check with your health  care provider before you start any new activity, especially if you take medicine or have a medical condition.  Start any new activity slowly and work up to your fitness level. HOW CAN I HELP TO PROTECT ELDERLY RELATIVES AND NEIGHBORS FROM HEAT EXHAUSTION? People who are age 64 or older are at greater risk for heat exhaustion. Their bodies have a harder time adjusting to heat. They are also more likely to have a medical condition or be on medicines that increase their risk for heat exhaustion. They may get heat exhaustion indoors if the heat is high for several days. You can help to protect them during hot weather by:  Checking on them two or more times each day.  Making sure that they are drinking plenty of cool, nonalcoholic, and caffeine-free fluids.  Making sure that they use their air conditioner.  Taking them to a location where air conditioning is available.  Talking with their health care provider about their medical needs, medicines, and fluid requirements.   This information is not intended to replace advice given to you by your health care provider. Make sure you discuss any questions you have with your health care provider.   Document Released: 08/18/2008 Document Revised: 07/31/2015 Document Reviewed: 10/17/2014 Elsevier Interactive Patient Education 2016 Elsevier [employer] Dehydration, Adult Dehydration is a condition in which you do not have enough fluid or water in your body. It happens when you take in less fluid than you lose. Vital organs such as the kidneys, brain, and heart cannot function without a proper amount of fluids. Any loss of fluids from the body can cause dehydration.  Dehydration can range from mild to severe. This condition should be treated right away to help prevent it from becoming severe. CAUSES  This condition may be caused by:  Vomiting.  Diarrhea.  Excessive sweating, such as when exercising in hot or humid weather.  Not drinking enough fluid during  strenuous exercise or during an illness.  Excessive urine output.  Fever.  Certain medicines. RISK FACTORS This condition is more likely to develop in:  People who are taking certain medicines that cause the body to lose excess fluid (diuretics).   People who have a chronic illness, such as diabetes, that may increase urination.  Older adults.   People who live at high altitudes.   People who participate in endurance sports.  SYMPTOMS  Mild Dehydration  Thirst.  Dry lips.  Slightly dry mouth.  Dry, warm skin. Moderate Dehydration  Very dry mouth.   Muscle cramps.   Dark urine and decreased urine production.   Decreased tear production.   Headache.   Light-headedness, especially when you stand up from a sitting position.  Severe Dehydration  Changes in skin.   Cold and clammy skin.   Skin does not spring back quickly when lightly pinched and released.   Changes in body fluids.   Extreme thirst.   No tears.   Not able to sweat when body temperature is high, such as in hot weather.   Minimal urine production.   Changes in vital  signs.   Rapid, weak pulse (more than 100 beats per minute when you are sitting still).   Rapid breathing.   Low blood pressure.   Other changes.   Sunken eyes.   Cold hands and feet.   Confusion.  Lethargy and difficulty being awakened.  Fainting (syncope).   Short-term weight loss.   Unconsciousness. DIAGNOSIS  This condition may be diagnosed based on your symptoms. You may also have tests to determine how severe your dehydration is. These tests may include:   Urine tests.   Blood tests.  TREATMENT  Treatment for this condition depends on the severity. Mild or moderate dehydration can often be treated at home. Treatment should be started right away. Do not wait until dehydration becomes severe. Severe dehydration needs to be treated at the hospital. Treatment for Mild  Dehydration  Drinking plenty of water to replace the fluid you have lost.   Replacing minerals in your blood (electrolytes) that you may have lost.  Treatment for Moderate Dehydration  Consuming oral rehydration solution (ORS). Treatment for Severe Dehydration  Receiving fluid through an IV tube.   Receiving electrolyte solution through a feeding tube that is passed through your nose and into your stomach (nasogastric tube or NG tube).  Correcting any abnormalities in electrolytes. HOME CARE INSTRUCTIONS   Drink enough fluid to keep your urine clear or pale yellow.   Drink water or fluid slowly by taking small sips. You can also try sucking on ice cubes.  Have food or beverages that contain electrolytes. Examples include bananas and sports drinks.  Take over-the-counter and prescription medicines only as told by your health care provider.   Prepare ORS according to the manufacturer's instructions. Take sips of ORS every 5 minutes until your urine returns to normal.  If you have vomiting or diarrhea, continue to try to drink water, ORS, or both.   If you have diarrhea, avoid:   Beverages that contain caffeine.   Fruit juice.   Milk.   Carbonated soft drinks.  Do not take salt tablets. This can lead to the condition of having too much sodium in your body (hypernatremia).  SEEK MEDICAL CARE IF:  You cannot eat or drink without vomiting.  You have had moderate diarrhea during a period of more than 24 hours.  You have a fever. SEEK IMMEDIATE MEDICAL CARE IF:   You have extreme thirst.  You have severe diarrhea.  You have not urinated in 6-8 hours, or you have urinated only a small amount of very dark urine.  You have shriveled skin.  You are dizzy, confused, or both.   This information is not intended to replace advice given to you by your health care provider. Make sure you discuss any questions you have with your health care provider.   Document  Released: 11/09/2005 Document Revised: 07/31/2015 Document Reviewed: 03/27/2015 Elsevier Interactive Patient Education [employer].

## 2016-05-27 NOTE — ED Notes (Signed)
The patient presented to the [facility] with a complaint of a migraine headache that has been getting increasingly worse for 1 week.

## 2016-07-31 NOTE — Progress Notes (Signed)
DOS 01.16.2017 Right austin bunionectomy with screw fixation

## 2017-04-18 IMAGING — CR DG HIP (WITH OR WITHOUT PELVIS) 2-3V*L*
3 series · 3 of 3 positions shown · non-contrast
Comparison: None.

CLINICAL DATA: Hip pain

EXAM:
DG HIP (WITH OR WITHOUT PELVIS) 2-3V LEFT

[pelvis ap]
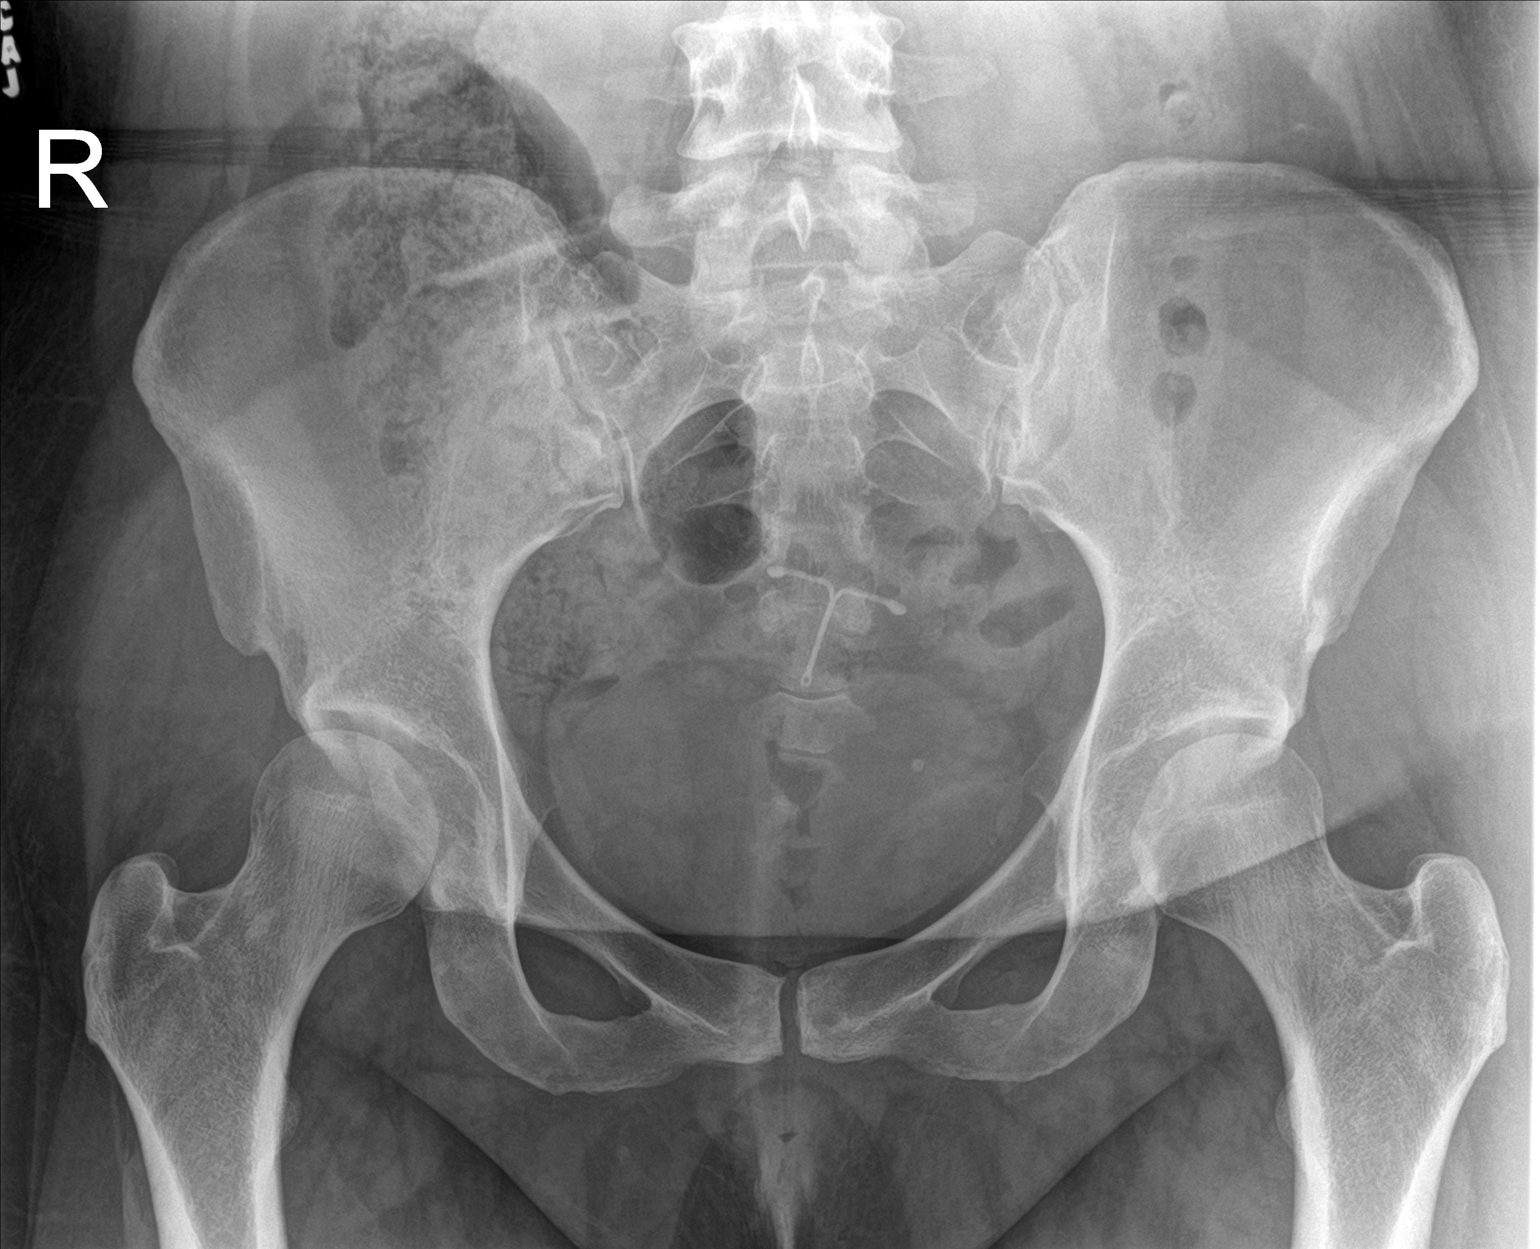

[hip ap]
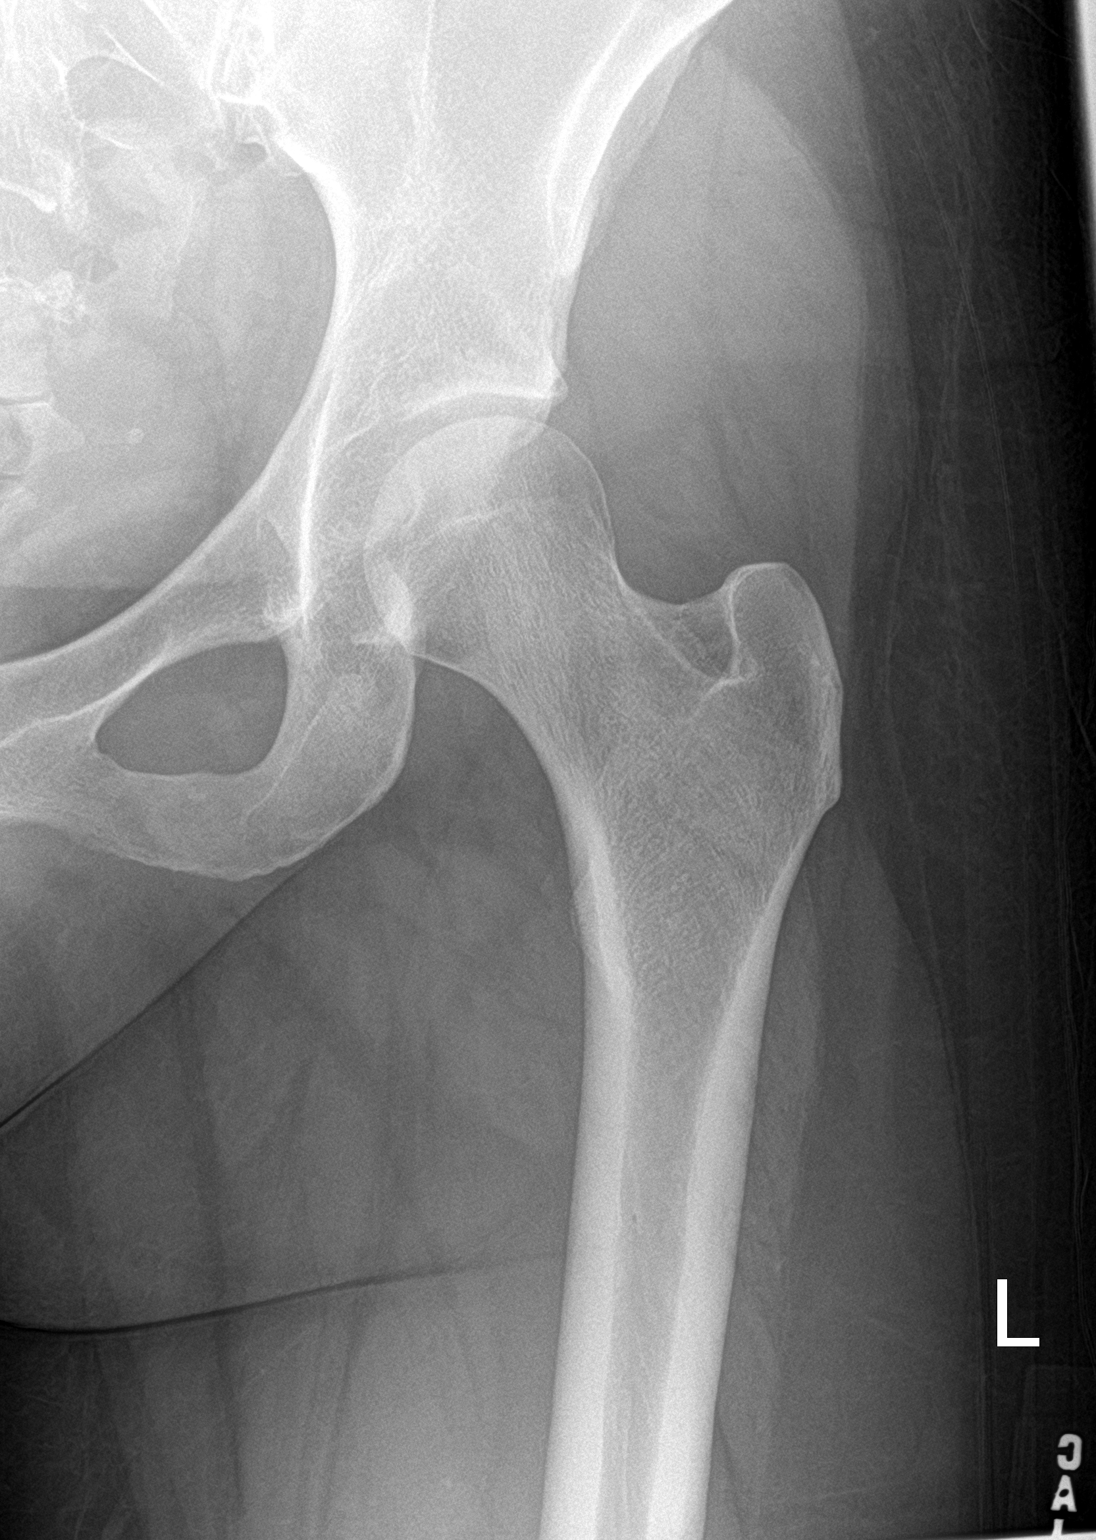

[hip lat]
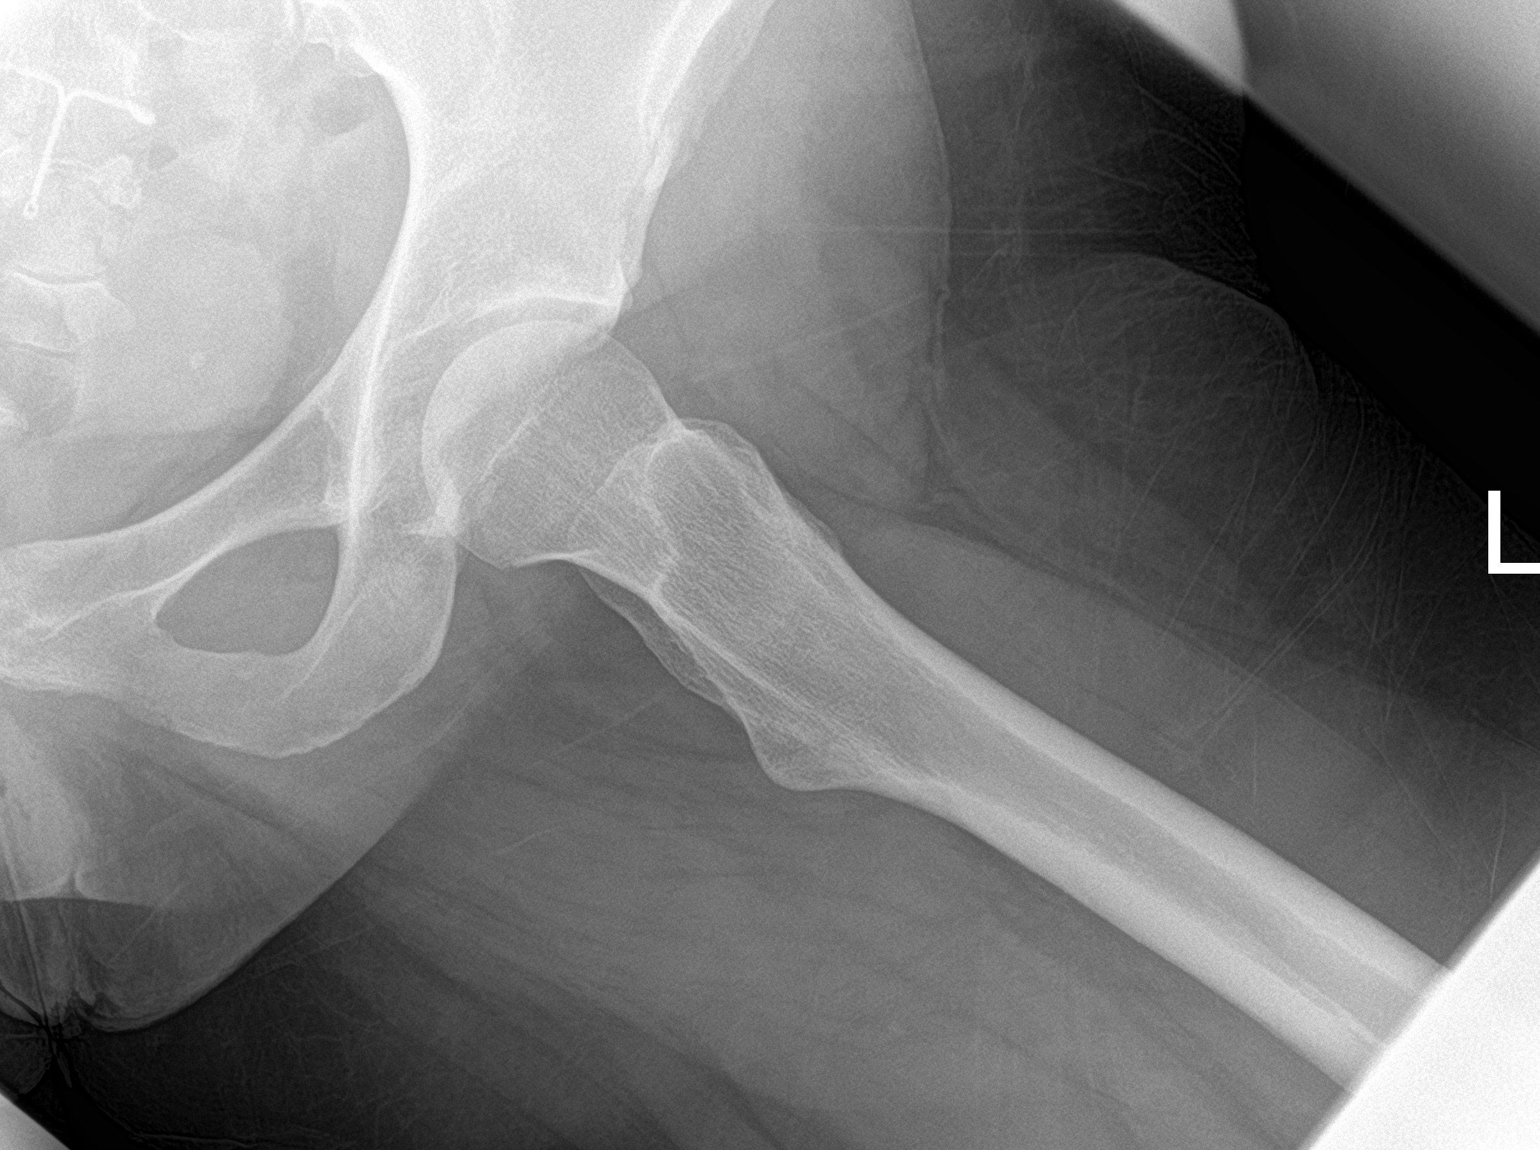

[3 of 3 positions shown; findings below may reference images not displayed]

FINDINGS: SI joints are patent. Pubic symphysis and rami are intact. IUD in
the pelvis. No fracture or malalignment.
IMPRESSION: No acute osseous abnormality

## 2017-05-19 ENCOUNTER — Emergency Department (HOSPITAL_COMMUNITY)
Admission: EM | Admit: 2017-05-19 | Discharge: 2017-05-19 | Disposition: A | Discharge status: Home / Self Care | Payer: Medicaid Other | Attending: Emergency Medicine | Admitting: Emergency Medicine

## 2017-05-19 ENCOUNTER — Encounter (HOSPITAL_COMMUNITY): Payer: Self-pay | Admitting: Emergency Medicine

## 2017-05-19 ENCOUNTER — Emergency Department (HOSPITAL_COMMUNITY): Payer: Medicaid Other

## 2017-05-19 DIAGNOSIS — M25531 Pain in right wrist: Secondary | ICD-10-CM | POA: Diagnosis not present

## 2017-05-19 DIAGNOSIS — S8002XA Contusion of left knee, initial encounter: Secondary | ICD-10-CM | POA: Diagnosis not present

## 2017-05-19 DIAGNOSIS — Y999 Unspecified external cause status: Secondary | ICD-10-CM | POA: Insufficient documentation

## 2017-05-19 DIAGNOSIS — S8992XA Unspecified injury of left lower leg, initial encounter: Secondary | ICD-10-CM | POA: Diagnosis present

## 2017-05-19 DIAGNOSIS — F17210 Nicotine dependence, cigarettes, uncomplicated: Secondary | ICD-10-CM | POA: Insufficient documentation

## 2017-05-19 DIAGNOSIS — Y939 Activity, unspecified: Secondary | ICD-10-CM | POA: Diagnosis not present

## 2017-05-19 DIAGNOSIS — Z91040 Latex allergy status: Secondary | ICD-10-CM | POA: Insufficient documentation

## 2017-05-19 DIAGNOSIS — Y92410 Unspecified street and highway as the place of occurrence of the external cause: Secondary | ICD-10-CM | POA: Diagnosis not present

## 2017-05-19 DIAGNOSIS — J45909 Unspecified asthma, uncomplicated: Secondary | ICD-10-CM | POA: Diagnosis not present
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
## 2017-05-19 MED ORDER — OXYCODONE-ACETAMINOPHEN 5-325 MG PO TABS
1.0000 | ORAL_TABLET | Freq: Once | ORAL | Status: AC
  Administered 2017-05-19: 1 via ORAL
  Filled 2017-05-19: qty 1

## 2017-05-19 MED ORDER — HYDROCODONE-ACETAMINOPHEN 5-325 MG PO TABS: 1.0000 | ORAL_TABLET | Freq: Four times a day (QID) | ORAL | 0 refills | Status: DC | PRN

## 2017-05-19 MED ORDER — NAPROXEN 500 MG PO TABS: 500.0000 mg | ORAL_TABLET | Freq: Two times a day (BID) | ORAL | 0 refills | Status: DC | PRN
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
## 2017-05-19 NOTE — ED Notes (Signed)
Patient was educated not to drive, operate heavy machinery, or drink alcohol while taking narcotic medication.  
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
## 2017-05-24 ENCOUNTER — Ambulatory Visit (INDEPENDENT_AMBULATORY_CARE_PROVIDER_SITE_OTHER): Payer: Medicaid Other | Admitting: Orthopaedic Surgery

## 2017-05-24 DIAGNOSIS — M25531 Pain in right wrist: Secondary | ICD-10-CM | POA: Insufficient documentation

## 2017-05-24 DIAGNOSIS — M25562 Pain in left knee: Secondary | ICD-10-CM | POA: Insufficient documentation

## 2017-05-24 MED ORDER — METHYLPREDNISOLONE 4 MG PO TABS: ORAL_TABLET | ORAL | 0 refills | Status: DC

## 2017-05-24 MED ORDER — TIZANIDINE HCL 4 MG PO TABS: 4.0000 mg | ORAL_TABLET | Freq: Three times a day (TID) | ORAL | 0 refills | Status: DC | PRN
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
## 2017-06-07 ENCOUNTER — Ambulatory Visit (INDEPENDENT_AMBULATORY_CARE_PROVIDER_SITE_OTHER): Payer: Medicaid Other | Admitting: Orthopaedic Surgery

## 2017-06-07 DIAGNOSIS — M545 Low back pain, unspecified: Secondary | ICD-10-CM

## 2017-06-07 DIAGNOSIS — M25531 Pain in right wrist: Secondary | ICD-10-CM | POA: Diagnosis not present

## 2017-06-07 NOTE — Addendum Note (Signed)
Addended by: Minda Ditto, Geoffery Spruce on: 06/07/2017 04:57 PM   Modules accepted: Orders
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
## 2017-06-10 ENCOUNTER — Encounter: Payer: Self-pay | Admitting: Physical Therapy

## 2017-06-10 ENCOUNTER — Ambulatory Visit: Payer: Medicaid Other | Attending: Orthopaedic Surgery | Admitting: Physical Therapy

## 2017-06-10 DIAGNOSIS — M545 Low back pain, unspecified: Secondary | ICD-10-CM
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
## 2018-03-15 ENCOUNTER — Encounter (HOSPITAL_COMMUNITY): Payer: Self-pay | Admitting: Emergency Medicine

## 2018-03-15 ENCOUNTER — Ambulatory Visit (HOSPITAL_COMMUNITY)
Admission: EM | Admit: 2018-03-15 | Discharge: 2018-03-15 | Disposition: A | Discharge status: Home / Self Care | Payer: Medicaid Other | Attending: Family Medicine | Admitting: Family Medicine

## 2018-03-15 ENCOUNTER — Ambulatory Visit (INDEPENDENT_AMBULATORY_CARE_PROVIDER_SITE_OTHER): Payer: Medicaid Other

## 2018-03-15 DIAGNOSIS — S6991XA Unspecified injury of right wrist, hand and finger(s), initial encounter: Secondary | ICD-10-CM

## 2018-03-15 IMAGING — DX DG HAND COMPLETE 3+V*R*
3 series · 3 of 3 positions shown · non-contrast
Comparison: [DATE].

CLINICAL DATA: Thumb pain following heavy lifting, initial
encounter

EXAM:
RIGHT HAND - COMPLETE 3+ VIEW

[hand pa]
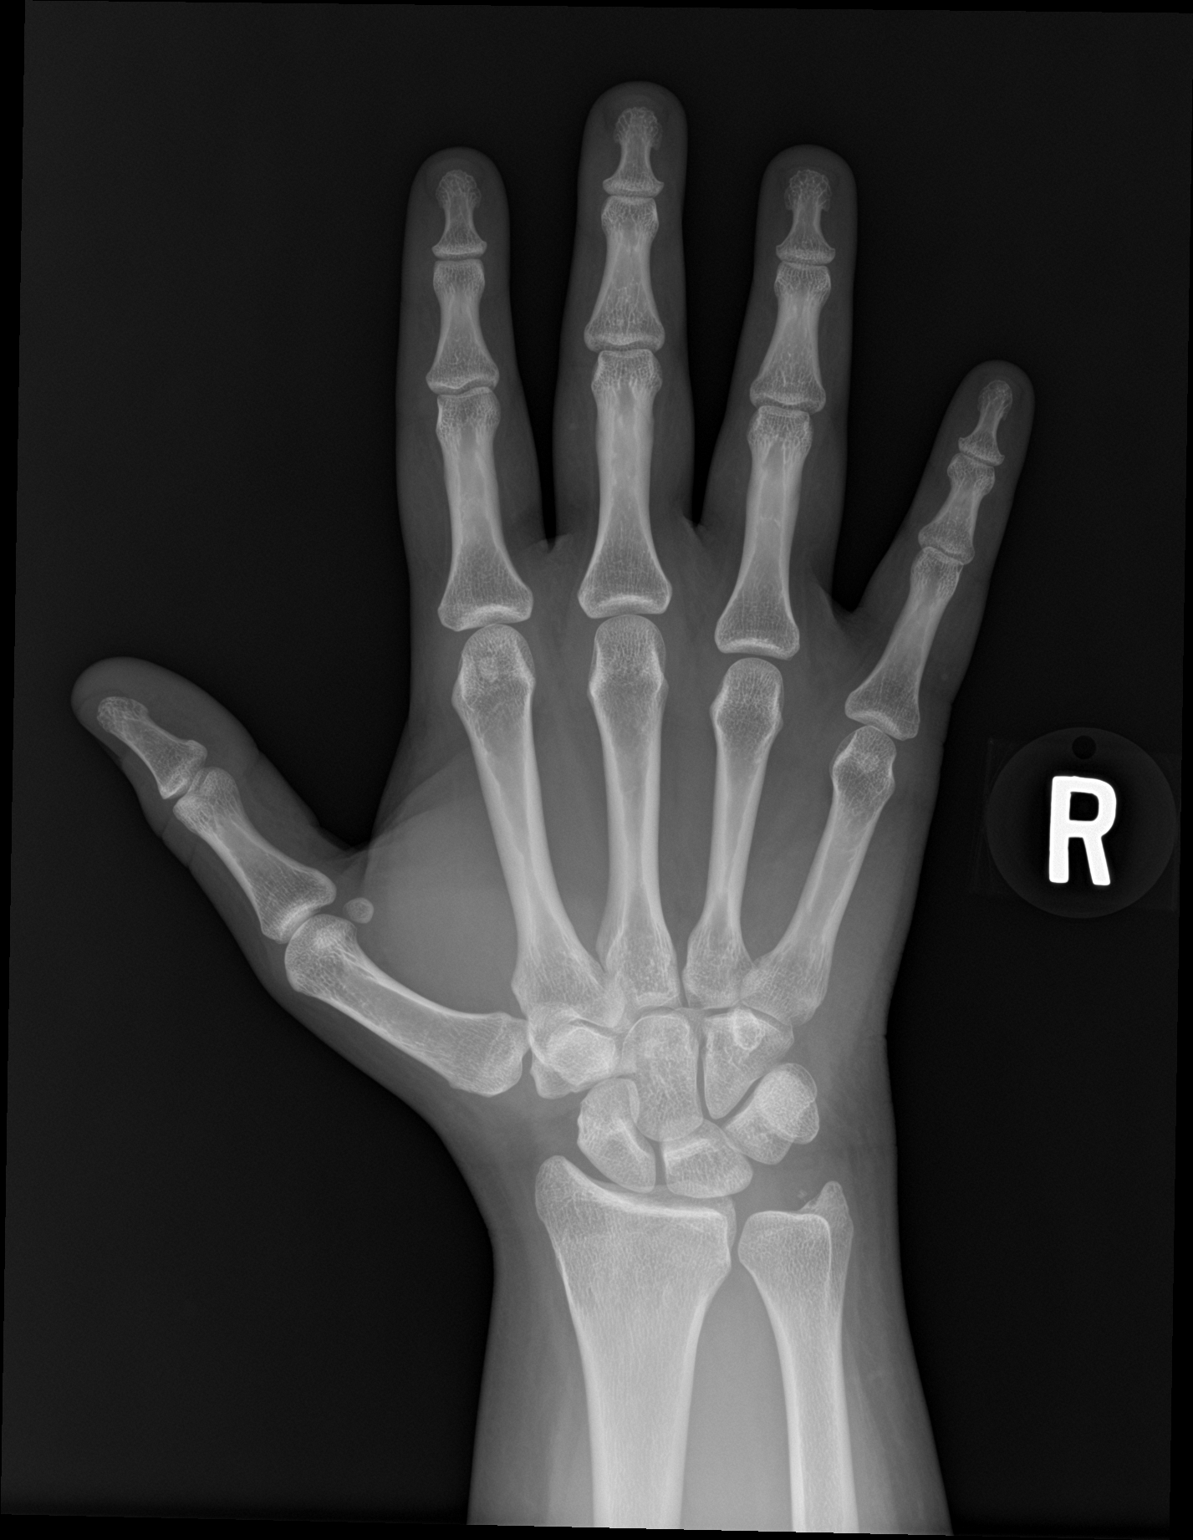

[hand obl]
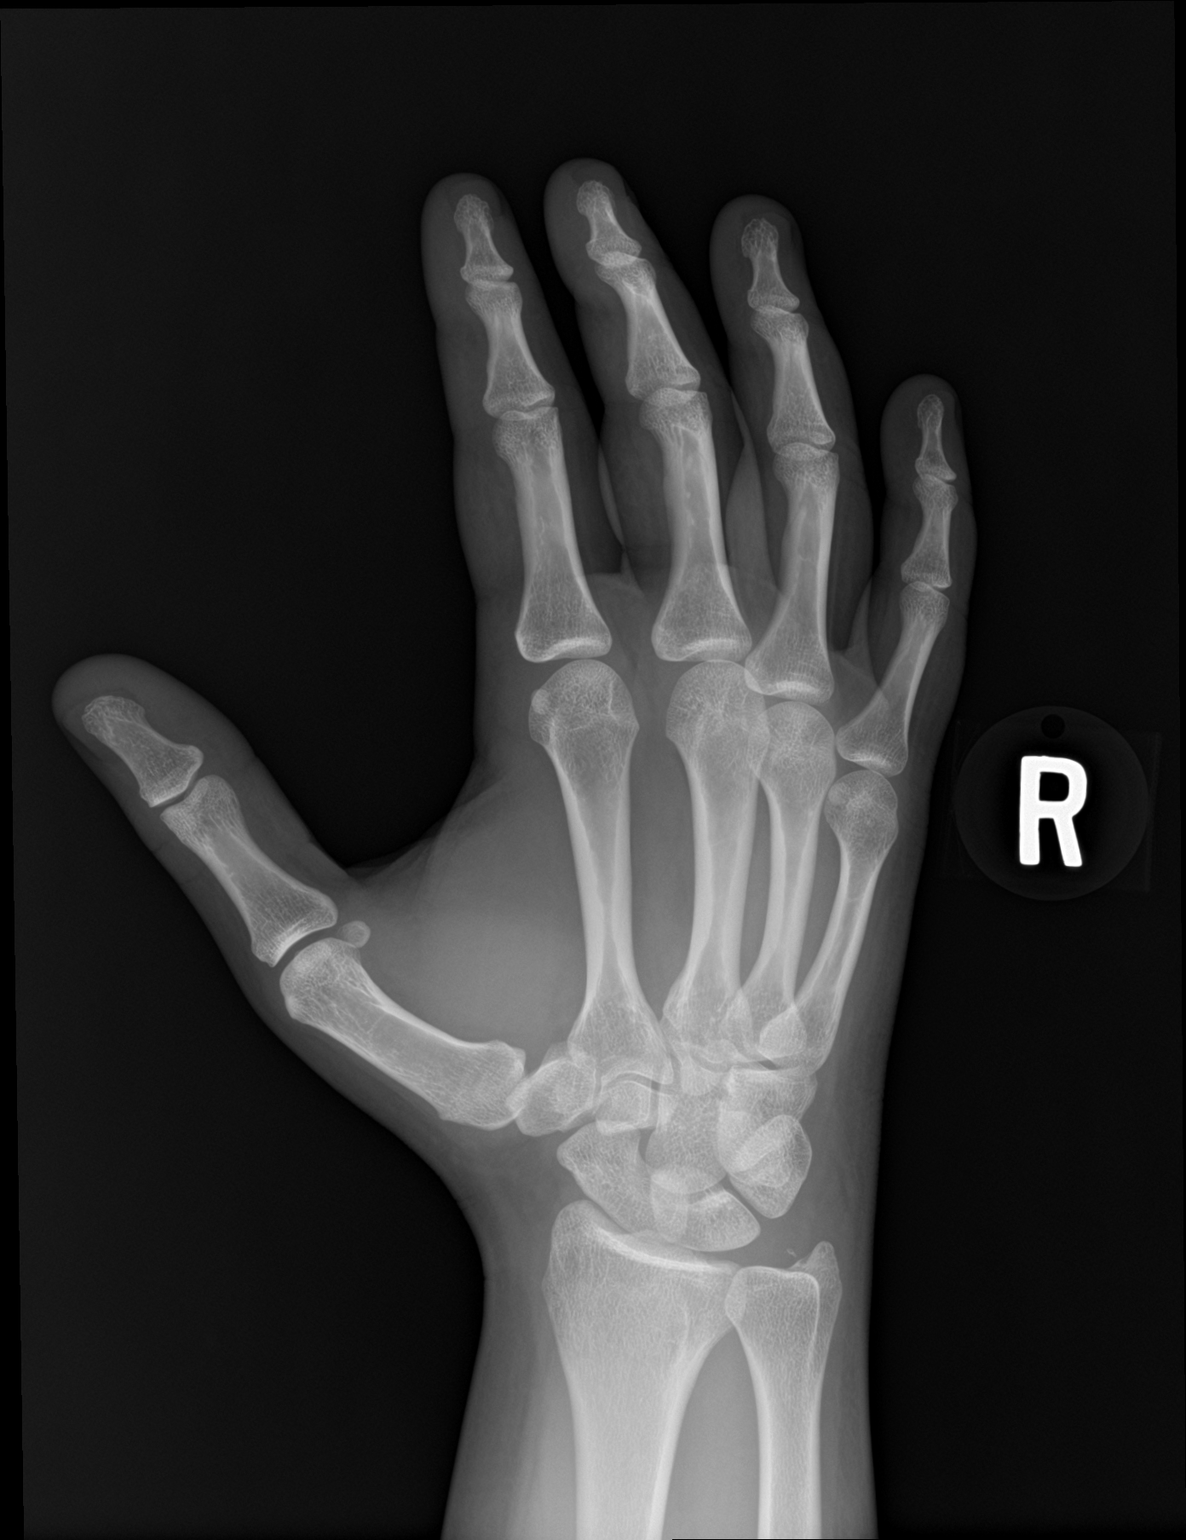

[hand lat]
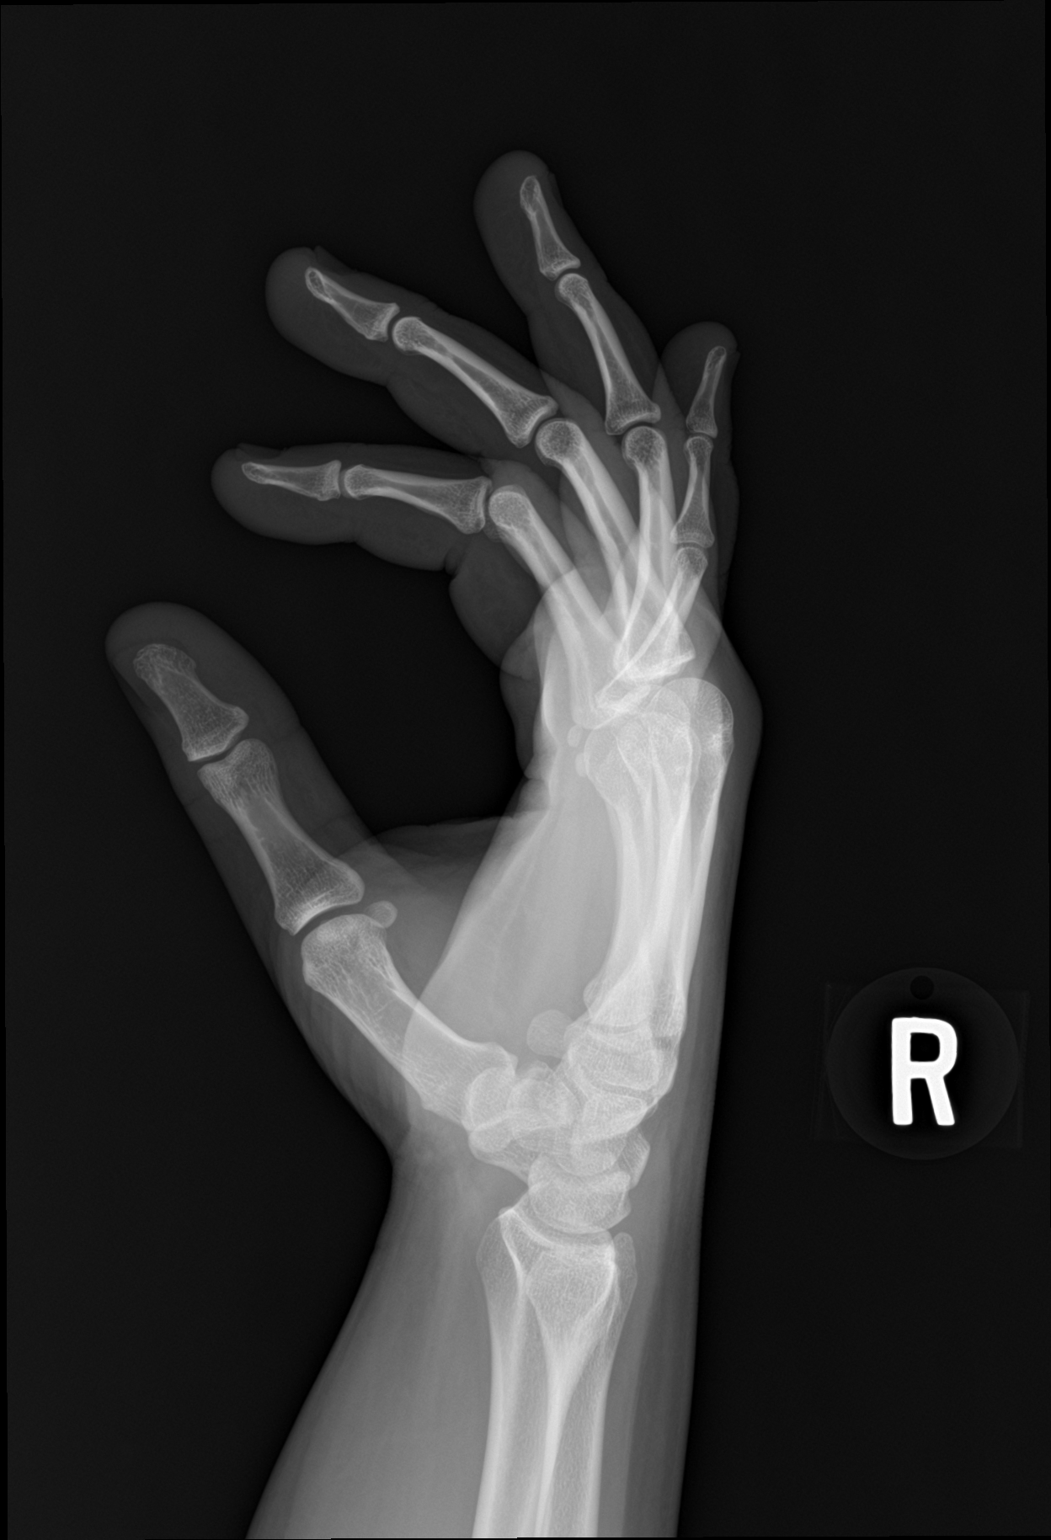

[3 of 3 positions shown; findings below may reference images not displayed]

FINDINGS: There is no evidence of fracture or dislocation. There is no
evidence of arthropathy or other focal bone abnormality. Soft
tissues are unremarkable. Well corticated bony density is noted
adjacent to the distal ulna consistent with the given clinical
history of prior fracture.
IMPRESSION: No acute abnormality noted.

## 2018-03-15 NOTE — Discharge Instructions (Addendum)
You may take ibuprofen 200mg  (4 tablets every 8 hours with food).

## 2018-03-15 NOTE — ED Provider Notes (Signed)
[place]   [id number] 03/15/18 Arrival Time: 4818  ASSESSMENT & PLAN:  1. Injury of right thumb, initial encounter     Imaging: Dg Hand Complete Right  Result Date: 03/15/2018 CLINICAL DATA:  Thumb pain following heavy lifting, initial encounter EXAM: RIGHT HAND - COMPLETE 3+ VIEW COMPARISON:  05/19/2017. FINDINGS: There is no evidence of fracture or dislocation. There is no evidence of arthropathy or other focal bone abnormality. Soft tissues are unremarkable. Well corticated bony density is noted adjacent to the distal ulna consistent with the given clinical history of prior fracture. IMPRESSION: No acute abnormality noted. Electronically Signed   By: Inez Catalina M.D.   On: 03/15/2018 18:44   Thumb spica splint. OTC analgesics preferred. Work note given. Will f/u in 3-5 days for recheck. May work on progressive ROM at home.  Reviewed expectations re: course of current medical issues. Questions answered. Outlined signs and symptoms indicating need for more acute intervention. Patient verbalized understanding. After Visit Summary given.  SUBJECTIVE: History from: patient. Yaffa Seckman is a 27 y.o. female who reports persistent moderate pain of her right thumb that is stable; described as aching without radiation. Onset: abrupt, today. 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 Relieved by: not moving thumb. Worsened by: certain movements. Associated symptoms: none reported. Extremity sensation changes or weakness: "a little pins and needles feeling in thumb". Self treatment: has not tried OTCs for relief of pain. History of similar: no  ROS: As per HPI.   OBJECTIVE:  Vitals:   03/15/18 1748  BP: 131/86  Pulse: 83  Resp: 18  Temp: 99 F (37.2 C)  TempSrc: Oral  SpO2: 99%    General appearance: alert; no distress Extremities: no cyanosis or edema; symmetrical with no gross  deformities; localized tenderness over her right thumb (more proximally_ with mild swelling and no bruising; ROM: difficult to assess; very limited by pain CV: normal extremity capillary refill Skin: warm and dry Neurologic: normal gait; normal symmetric reflexes in all extremities; normal sensation in all extremities Psychological: alert and cooperative; normal mood and affect  Allergies  Allergen Reactions  . Latex Hives and Rash    Past Medical History:  Diagnosis Date  . Anemia   . Asthma    hx chronic bronchitis/exercise induced asthma as pre teen only  . Environmental allergies   . Infection    UTI  . Seasonal allergies   . Wrist fracture, right    Social History   Socioeconomic History  . Marital status: Single    Spouse name: Not on file  . Number of children: Not on file  . Years of education: Not on file  . Highest education level: Not on file  Occupational History  . Not on file  Social Needs  . Financial resource strain: Not on file  . Food insecurity:    Worry: Not on file    Inability: Not on file  . Transportation needs:    Medical: Not on file    Non-medical: Not on file  Tobacco Use  . Smoking status: Current Every Day Smoker    Packs/day: 0.50    Types: Cigarettes  . Smokeless tobacco: Never Used  . Tobacco comment: quit in April 2014  Substance and Sexual Activity  . Alcohol use: No    Comment: occassionally   . Drug use: Yes    Types: Marijuana  . Sexual activity: Never  Lifestyle  .  Physical activity:    Days per week: Not on file    Minutes per session: Not on file  . Stress: Not on file  Relationships  . Social connections:    Talks on phone: Not on file    Gets together: Not on file    Attends religious service: Not on file    Active member of club or organization: Not on file    Attends meetings of clubs or organizations: Not on file    Relationship status: Not on file  . Intimate partner violence:    Fear of current or ex  partner: Not on file    Emotionally abused: Not on file    Physically abused: Not on file    Forced sexual activity: Not on file  Other Topics Concern  . Not on file  Social History Narrative  . Not on file   Family History  Problem Relation Age of Onset  . Cancer Father        liver  . Diabetes Father   . Hypertension Father   . Hypertension Mother   . Hypertension Maternal Grandmother   . Kidney disease Maternal Grandmother   . Diabetes Paternal Grandmother   . Hypertension Paternal Grandmother    Past Surgical History:  Procedure Laterality Date  . CESAREAN SECTION  2008  . CESAREAN SECTION N/A 06/15/2013   Procedure: CESAREAN SECTION;  Surgeon: Allyn Kenner, DO;  Location: [facility];  Service: Obstetrics;  Laterality: N/A;  . DILATION AND CURETTAGE OF UTERUS  08/2011  . FOOT SURGERY  2017  . INDUCED ABORTION        Vanessa Kick, MD 03/16/18 [phone]

## 2018-03-15 NOTE — ED Triage Notes (Signed)
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

## 2018-06-17 ENCOUNTER — Other Ambulatory Visit: Payer: Self-pay

## 2018-06-17 ENCOUNTER — Encounter (HOSPITAL_COMMUNITY): Payer: Self-pay | Admitting: *Deleted

## 2018-06-17 ENCOUNTER — Emergency Department (HOSPITAL_COMMUNITY)
Admission: EM | Admit: 2018-06-17 | Discharge: 2018-06-17 | Disposition: A | Discharge status: Home / Self Care | Payer: Medicaid Other | Attending: Emergency Medicine | Admitting: Emergency Medicine

## 2018-06-17 DIAGNOSIS — Z91040 Latex allergy status: Secondary | ICD-10-CM | POA: Diagnosis not present

## 2018-06-17 DIAGNOSIS — G43009 Migraine without aura, not intractable, without status migrainosus: Secondary | ICD-10-CM | POA: Insufficient documentation

## 2018-06-17 DIAGNOSIS — R51 Headache: Secondary | ICD-10-CM | POA: Diagnosis present

## 2018-06-17 DIAGNOSIS — H53149 Visual discomfort, unspecified: Secondary | ICD-10-CM | POA: Insufficient documentation

## 2018-06-17 DIAGNOSIS — F17210 Nicotine dependence, cigarettes, uncomplicated: Secondary | ICD-10-CM | POA: Insufficient documentation

## 2018-06-17 DIAGNOSIS — R112 Nausea with vomiting, unspecified: Secondary | ICD-10-CM | POA: Insufficient documentation

## 2018-06-17 DIAGNOSIS — F1290 Cannabis use, unspecified, uncomplicated: Secondary | ICD-10-CM | POA: Insufficient documentation

## 2018-06-17 HISTORY — DX: Migraine, unspecified, not intractable, without status migrainosus: G43.909

## 2018-06-17 MED ORDER — KETOROLAC TROMETHAMINE 30 MG/ML IJ SOLN
30.0000 mg | Freq: Once | INTRAMUSCULAR | Status: AC
  Administered 2018-06-17: 30 mg via INTRAVENOUS
  Filled 2018-06-17: qty 1

## 2018-06-17 MED ORDER — DIPHENHYDRAMINE HCL 50 MG/ML IJ SOLN
25.0000 mg | Freq: Once | INTRAMUSCULAR | Status: AC
  Administered 2018-06-17: 25 mg via INTRAVENOUS
  Filled 2018-06-17: qty 1

## 2018-06-17 MED ORDER — SODIUM CHLORIDE 0.9 % IV BOLUS
1000.0000 mL | Freq: Once | INTRAVENOUS | Status: AC
  Administered 2018-06-17: 1000 mL via INTRAVENOUS

## 2018-06-17 MED ORDER — IBUPROFEN 800 MG PO TABS: 800.0000 mg | ORAL_TABLET | Freq: Three times a day (TID) | ORAL | 0 refills | Status: AC

## 2018-06-17 MED ORDER — METOCLOPRAMIDE HCL 5 MG/ML IJ SOLN
10.0000 mg | Freq: Once | INTRAMUSCULAR | Status: AC
  Administered 2018-06-17: 10 mg via INTRAVENOUS
  Filled 2018-06-17: qty 2

## 2018-06-17 NOTE — ED Triage Notes (Signed)
Pt presents with a migraine since 6pm yesterday evening. Pt reports going to [place] yesterday.  Pt reports n/v and sensitivity to light. Pt states this is one of her worse migraines. Pt attempted to take ibuprofen at home but is unable to tolerate medications.  Pt a/o x 4 and ambulatory.   Hx migraines but last migraine was a year ago.

## 2018-06-17 NOTE — ED Notes (Signed)
Pt states migraine [place] (06[place]16/18) evening

## 2018-06-17 NOTE — Discharge Instructions (Addendum)
I'm glad we were able to make your headache better today. For future migraines, it is ok for you to use ibuprofen 800mg  up to three times a day.   If your migraines begin to become more frequent or intense, you may want to follow-up with a PCP about preventative medication for migraines. I have included information below on one of our primary care clinics.  Have a good weekend!

## 2018-06-17 NOTE — ED Provider Notes (Signed)
[facility] DEPT Provider Note  CSN: [id number] Arrival date & time: 06/17/18  1336  History   Chief Complaint Chief Complaint  Patient presents with  . Migraine   HPI Michele Avila is a 27 y.o. female with a medical history of migraines, asthma and allergies who presented to the ED for headache x1 day. Describes pain as 10/10, throbbing and felt behind the eyes. Associated symptoms: N/V, photophobia and phonophobia. Patient has tried 800mg  ibuprofen prior to coming to the ED. Denies vision changes, dizziness/lightheadedness, aura, slurred speech, facial droop, paresthesias, weakness. Denies nasal congestion, rhinorrhea or eye tearing. Denies recent head trauma, falls or injuries. She states that she has not had a migraine in over a year. Not taking any preventative medication.  Past Medical History:  Diagnosis Date  . Anemia   . Asthma    hx chronic bronchitis/exercise induced asthma as pre teen only  . Environmental allergies   . Infection    UTI  . Migraine   . Seasonal allergies   . Wrist fracture, right     Patient Active Problem List   Diagnosis Date Noted  . Acute pain of left knee 05/24/2017  . Pain in right wrist 05/24/2017  . Mirena intrauterine device in place 03/02/2016    Past Surgical History:  Procedure Laterality Date  . CESAREAN SECTION  2008  . CESAREAN SECTION N/A 06/15/2013   Procedure: CESAREAN SECTION;  Surgeon: Allyn Kenner, DO;  Location: [facility];  Service: Obstetrics;  Laterality: N/A;  . DILATION AND CURETTAGE OF UTERUS  08/2011  . FOOT SURGERY  2017  . INDUCED ABORTION       OB History    Gravida  5   Para  2   Term  2   Preterm      AB  3   Living  2     SAB      TAB  2   Ectopic      Multiple      Live Births  1            Home Medications    Prior to Admission medications   Medication Sig Start Date End Date Taking? Authorizing Provider  ibuprofen (ADVIL,MOTRIN) 800 MG tablet  Take 1 tablet (800 mg total) by mouth 3 (three) times daily for 10 days. 06/17/18 06/27/18  Mortis, Jonelle Sports, PA-C    Family History Family History  Problem Relation Age of Onset  . Cancer Father        liver  . Diabetes Father   . Hypertension Father   . Hypertension Mother   . Hypertension Maternal Grandmother   . Kidney disease Maternal Grandmother   . Diabetes Paternal Grandmother   . Hypertension Paternal Grandmother     Social History Social History   Tobacco Use  . Smoking status: Current Every Day Smoker    Packs/day: 0.50    Types: Cigarettes  . Smokeless tobacco: Never Used  . Tobacco comment: quit in April 2014  Substance Use Topics  . Alcohol use: No    Comment: occassionally   . Drug use: Yes    Types: Marijuana    Comment: Last use 06/11/18     Allergies   Latex   Review of Systems Review of Systems  Constitutional: Negative for chills and fever.  HENT: Negative.   Eyes: Positive for photophobia. Negative for pain and visual disturbance.  Gastrointestinal: Positive for nausea and vomiting.  Musculoskeletal: Negative for neck  pain and neck stiffness.  Neurological: Positive for headaches. Negative for dizziness, facial asymmetry, speech difficulty, weakness, light-headedness and numbness.  Psychiatric/Behavioral: Negative for confusion and decreased concentration.     Physical Exam Updated Vital Signs BP (!) 130/91 (BP Location: Left Arm)   Pulse 65   Temp 98.4 F (36.9 C) (Oral)   Resp 16   Ht 5\' 9"  (1.753 m)   Wt 101.2 kg (223 lb)   SpO2 100%   BMI 32.93 kg/m   Physical Exam  Constitutional: She appears well-developed and well-nourished.  Sitting in the dark lying down still  Eyes: Pupils are equal, round, and reactive to light. Conjunctivae, EOM and lids are normal.  Neck: Normal range of motion and full passive range of motion without pain. Neck supple. No spinous process tenderness and no muscular tenderness present. Normal range of  motion present.  Cardiovascular: Normal rate, regular rhythm and normal heart sounds.  No murmur heard. Pulmonary/Chest: Effort normal and breath sounds normal.  Neurological: She is alert. She has normal strength and normal reflexes. No cranial nerve deficit or sensory deficit. She exhibits normal muscle tone.  Nursing note and vitals reviewed.    ED Treatments / Results  Labs (all labs ordered are listed, but only abnormal results are displayed) Labs Reviewed - No data to display  EKG None  Radiology No results found.  Procedures Procedures (including critical care time)  Medications Ordered in ED Medications  sodium chloride 0.9 % bolus 1,000 mL (0 mLs Intravenous Stopped 06/17/18 2104)  ketorolac (TORADOL) 30 MG/ML injection 30 mg (30 mg Intravenous Given 06/17/18 1936)  metoCLOPramide (REGLAN) injection 10 mg (10 mg Intravenous Given 06/17/18 1936)  diphenhydrAMINE (BENADRYL) injection 25 mg (25 mg Intravenous Given 06/17/18 1936)     Initial Impression / Assessment and Plan / ED Course  Triage vital signs and the nursing notes have been reviewed.  Pertinent labs & imaging results that were available during care of the patient were reviewed and considered in medical decision making (see chart for details).  Patient presents with symptoms similar to migraine headache. Her physical exam is reassuring as it is normal and she has no focal neuro deficits that would prompt head imaging. Denies fever, AMS/LOC, nuchal rigidity and had no meningeal signs on exam.  Clinical Course as of Jun 17 2130  Fri Jun 17, 2018  1910 IV migraine cocktail ordered for acute migraine relief. Will re-evaluate patient after it has been administered.   [GM]  2033 Patient reports that headache has significantly improved with IV medication.   [GM]    Clinical Course User Index [GM] Mortis, Jonelle Sports, PA-C   Final Clinical Impressions(s) / ED Diagnoses  1. Migraine Headache. Relief achieved  with IV migraine cocktail. Prescribed ibuprofen 800mg  TID PRN for acute headaches. Advised to establish and follow-up care with a PCP should migraines become more frequent.  Dispo: Home. After thorough clinical evaluation, this patient is determined to be medically stable and can be safely discharged with the previously mentioned treatment and/or outpatient follow-up/referral(s). At this time, there are no other apparent medical conditions that require further screening, evaluation or treatment.  Final diagnoses:  Migraine without aura and without status migrainosus, not intractable    ED Discharge Orders        Ordered    ibuprofen (ADVIL,MOTRIN) 800 MG tablet  3 times daily     06/17/18 2034        Maury Dus I, [place] 06/17/18 2132  Isla Pence, MD 06/17/18 [phone]

## 2018-06-30 ENCOUNTER — Encounter (HOSPITAL_COMMUNITY): Payer: Self-pay | Admitting: Emergency Medicine

## 2018-06-30 ENCOUNTER — Ambulatory Visit (HOSPITAL_COMMUNITY)
Admission: EM | Admit: 2018-06-30 | Discharge: 2018-06-30 | Disposition: A | Discharge status: Home / Self Care | Payer: Medicaid Other | Attending: Family Medicine | Admitting: Family Medicine

## 2018-06-30 DIAGNOSIS — K648 Other hemorrhoids: Secondary | ICD-10-CM

## 2018-06-30 DIAGNOSIS — G43001 Migraine without aura, not intractable, with status migrainosus: Secondary | ICD-10-CM

## 2018-06-30 DIAGNOSIS — G44001 Cluster headache syndrome, unspecified, intractable: Secondary | ICD-10-CM

## 2018-06-30 DIAGNOSIS — R110 Nausea: Secondary | ICD-10-CM | POA: Diagnosis not present

## 2018-06-30 MED ORDER — KETOROLAC TROMETHAMINE 30 MG/ML IJ SOLN
INTRAMUSCULAR | Status: AC
  Filled 2018-06-30: qty 1

## 2018-06-30 MED ORDER — ONDANSETRON 4 MG PO TBDP
4.0000 mg | ORAL_TABLET | Freq: Once | ORAL | Status: AC
  Administered 2018-06-30: 4 mg via ORAL

## 2018-06-30 MED ORDER — HYDROCORTISONE 2.5 % RE CREA: TOPICAL_CREAM | RECTAL | 0 refills | Status: DC

## 2018-06-30 MED ORDER — ONDANSETRON 4 MG PO TBDP
ORAL_TABLET | ORAL | Status: AC
  Filled 2018-06-30: qty 1

## 2018-06-30 MED ORDER — KETOROLAC TROMETHAMINE 30 MG/ML IJ SOLN
30.0000 mg | Freq: Once | INTRAMUSCULAR | Status: AC
  Administered 2018-06-30: 30 mg via INTRAMUSCULAR

## 2018-06-30 NOTE — ED Provider Notes (Signed)
[facility]    CSN: [id number] Arrival date & time: 06/30/18  1005     History   Chief Complaint Chief Complaint  Patient presents with  . Rectal Bleeding    HPI Michele Avila is a 27 y.o. female.   Pt is a 27 year old female with PMH of migraines that is here for headache, nausea and rectal bleeding. The rectal bleeding was after a BM that she had this am. She reports it was soft and she didn't have to strain but when she wiped there was blood on the toilet paper. sts bright red blood. She reports that she sat on the toilet for a while and developed a right sided headache with nausea and photophobia. Her headache is consistent with a typical migraine.  She denies any abd pain, diarrhea or vomiting. She denies any fever, chills fatigue or recent travels. No dizziness, or blurred vision. She denies any rectal pain or itching.   She does smoke, she does not drink.   ROS per HPI      Past Medical History:  Diagnosis Date  . Anemia   . Asthma    hx chronic bronchitis/exercise induced asthma as pre teen only  . Environmental allergies   . Infection    UTI  . Migraine   . Seasonal allergies   . Wrist fracture, right     Patient Active Problem List   Diagnosis Date Noted  . Acute pain of left knee 05/24/2017  . Pain in right wrist 05/24/2017  . Mirena intrauterine device in place 03/02/2016    Past Surgical History:  Procedure Laterality Date  . CESAREAN SECTION  2008  . CESAREAN SECTION N/A 06/15/2013   Procedure: CESAREAN SECTION;  Surgeon: Allyn Kenner, DO;  Location: [facility];  Service: Obstetrics;  Laterality: N/A;  . DILATION AND CURETTAGE OF UTERUS  08/2011  . FOOT SURGERY  2017  . INDUCED ABORTION      OB History    Gravida  5   Para  2   Term  2   Preterm      AB  3   Living  2     SAB      TAB  2   Ectopic      Multiple      Live Births  1            Home Medications    Prior to Admission medications   Medication  Sig Start Date End Date Taking? Authorizing Provider  Levonorgestrel (MIRENA, 52 MG, IU) by Intrauterine route.   Yes [provider]  hydrocortisone (ANUSOL-HC) 2.5 % rectal cream Apply rectally 2 times daily 06/30/18   Orvan July, NP    Family History Family History  Problem Relation Age of Onset  . Cancer Father        liver  . Diabetes Father   . Hypertension Father   . Hypertension Mother   . Hypertension Maternal Grandmother   . Kidney disease Maternal Grandmother   . Diabetes Paternal Grandmother   . Hypertension Paternal Grandmother     Social History Social History   Tobacco Use  . Smoking status: Current Every Day Smoker    Packs/day: 0.50    Types: Cigarettes  . Smokeless tobacco: Never Used  . Tobacco comment: quit in April 2014  Substance Use Topics  . Alcohol use: No    Comment: occassionally   . Drug use: Yes    Types: Marijuana  Comment: Last use 06/11/18     Allergies   Latex   Review of Systems Review of Systems   Physical Exam Triage Vital Signs ED Triage Vitals [06/30/18 1015]  Enc Vitals Group     BP 138/85     Pulse Rate 87     Resp 16     Temp 98.4 F (36.9 C)     Temp src      SpO2 95 %     Weight      Height      Head Circumference      Peak Flow      Pain Score 0     Pain Loc      Pain Edu?      Excl. in [place]?    No data found.  Updated Vital Signs BP 138/85   Pulse 87   Temp 98.4 F (36.9 C)   Resp 16   SpO2 95%   Visual Acuity Right Eye Distance:   Left Eye Distance:   Bilateral Distance:    Right Eye Near:   Left Eye Near:    Bilateral Near:     Physical Exam  Constitutional: She is oriented to person, place, and time. She appears well-developed and well-nourished.  HENT:  Head: Normocephalic and atraumatic.  Eyes: Pupils are equal, round, and reactive to light. Conjunctivae and EOM are normal. Right eye exhibits no discharge. Left eye exhibits no discharge.  Pt squinting the right eye due  to pain. But able to open eye.   Neck: Normal range of motion.  Pulmonary/Chest: Effort normal.  Abdominal: Soft. Bowel sounds are normal. She exhibits no distension and no mass. There is no tenderness. There is no rebound and no guarding. No hernia.  No tenderness with deep and light palpation. No masses or rebound.   Genitourinary:     Genitourinary Comments: Small external hemorrhoid . No bleeding. Non tender to touch.   Lymphadenopathy:    She has no cervical adenopathy.  Neurological: She is alert and oriented to person, place, and time.  Cranial nerves intact. Strength 5/5. No sensory deficits.   Skin: Skin is warm and dry.  Psychiatric: She has a normal mood and affect.  Nursing note and vitals reviewed.    UC Treatments / Results  Labs (all labs ordered are listed, but only abnormal results are displayed) Labs Reviewed - No data to display  EKG None  Radiology No results found.  Procedures Procedures (including critical care time)  Medications Ordered in UC Medications  ketorolac (TORADOL) 30 MG/ML injection 30 mg (30 mg Intramuscular Given 06/30/18 1045)  ondansetron (ZOFRAN-ODT) disintegrating tablet 4 mg (4 mg Oral Given 06/30/18 1045)    Initial Impression / Assessment and Plan / UC Course  I have reviewed the triage vital signs and the nursing notes.  Pertinent labs & imaging results that were available during my care of the patient were reviewed by me and considered in my medical decision making (see chart for details).     Toradol for headache Zofran for nausea. Anusol and sitz bath for hemorrhoid.  Follow up as needed or return sooner for worse symptoms.   Final Clinical Impressions(s) / UC Diagnoses   Final diagnoses:  Other hemorrhoids  Cluster headache, not intractable, unspecified chronicity pattern     Discharge Instructions     You can try a hot sitz bath to see if this helps reduce the hemorrhoid.  I will also give you some Anusol  cream to see if this helps.  Monitor for worsening symptoms.       ED Prescriptions    Medication Sig Dispense Auth. Provider   hydrocortisone (ANUSOL-HC) 2.5 % rectal cream Apply rectally 2 times daily 28.35 g Rozanna Box, Raveen Wieseler A, NP     Controlled Substance Prescriptions [place] Controlled Substance Registry consulted? Not Applicable   Orvan July, NP 06/30/18 1200

## 2018-06-30 NOTE — Discharge Instructions (Signed)
You can try a hot sitz bath in the tub for 20-30 minutes to see if this helps reduce the hemorrhoid.  I will also give you some Anusol cream to see if this helps.  We treated your headache.  Monitor for worsening symptoms.

## 2018-06-30 NOTE — ED Triage Notes (Signed)
Pt states she woke up this morning and had a soft bowel movement and noticed blood coming from her rectum. Pt also c/o headache. States she has hx of IBS.

## 2018-07-10 ENCOUNTER — Emergency Department (HOSPITAL_COMMUNITY)
Admission: EM | Admit: 2018-07-10 | Discharge: 2018-07-10 | Disposition: A | Discharge status: Home / Self Care | Payer: Medicaid Other | Attending: Emergency Medicine | Admitting: Emergency Medicine

## 2018-07-10 ENCOUNTER — Encounter (HOSPITAL_COMMUNITY): Payer: Self-pay

## 2018-07-10 DIAGNOSIS — F17210 Nicotine dependence, cigarettes, uncomplicated: Secondary | ICD-10-CM | POA: Insufficient documentation

## 2018-07-10 DIAGNOSIS — R51 Headache: Secondary | ICD-10-CM | POA: Diagnosis present

## 2018-07-10 DIAGNOSIS — J45909 Unspecified asthma, uncomplicated: Secondary | ICD-10-CM | POA: Insufficient documentation

## 2018-07-10 DIAGNOSIS — G43819 Other migraine, intractable, without status migrainosus: Secondary | ICD-10-CM | POA: Insufficient documentation

## 2018-07-10 LAB — POC URINE PREG, ED: PREG TEST UR: NEGATIVE

## 2018-07-10 MED ORDER — DEXAMETHASONE SODIUM PHOSPHATE 10 MG/ML IJ SOLN
10.0000 mg | Freq: Once | INTRAMUSCULAR | Status: AC
  Administered 2018-07-10: 10 mg via INTRAVENOUS
  Filled 2018-07-10: qty 1

## 2018-07-10 MED ORDER — ORPHENADRINE CITRATE 30 MG/ML IJ SOLN: 30.0000 mg | Freq: Two times a day (BID) | INTRAMUSCULAR | Status: DC

## 2018-07-10 MED ORDER — DIPHENHYDRAMINE HCL 50 MG/ML IJ SOLN
12.5000 mg | Freq: Once | INTRAMUSCULAR | Status: AC
  Administered 2018-07-10: 12.5 mg via INTRAVENOUS
  Filled 2018-07-10: qty 1

## 2018-07-10 MED ORDER — PROMETHAZINE HCL 25 MG/ML IJ SOLN
25.0000 mg | Freq: Once | INTRAMUSCULAR | Status: AC
  Administered 2018-07-10: 25 mg via INTRAVENOUS
  Filled 2018-07-10: qty 1

## 2018-07-10 MED ORDER — LACTATED RINGERS IV BOLUS
1000.0000 mL | Freq: Once | INTRAVENOUS | Status: AC
  Administered 2018-07-10: 1000 mL via INTRAVENOUS

## 2018-07-10 NOTE — ED Notes (Signed)
Pt and mother voiced frustration regarding being put in a hallway bed while Pt was actively experiencing a migraine--stating that "the light, activity, and noises is making it worse". RN notified.

## 2018-07-10 NOTE — ED Triage Notes (Signed)
Onset this morning upon awakening migraine, vomiting.  Takes ibuprofen with no relief.  Sensitive to light.

## 2018-07-10 NOTE — Discharge Instructions (Addendum)
[name]:  Thank you for allowing [name] to take care of you today.  We hope you begin feeling better soon.  To-Do: Please follow-up with Neurology Please return to the Emergency Department or call 911 if you experience chest pain, shortness of breath, severe pain, severe fever, altered mental status, or have any reason to think that you need emergency medical care.  Thank you again.  Hope you feel better soon.

## 2018-07-10 NOTE — ED Provider Notes (Signed)
[facility] EMERGENCY DEPARTMENT Provider Note   CSN: [id number] Arrival date & time: 07/10/18  1400     History   Chief Complaint Chief Complaint  Patient presents with  . Headache    HPI Michele Avila is a 26 y.o. female.  HPI Michele Avila is a 27 year old female with a history of migraines and asthma who presents due to a severe headache.  She says that she developed a headache earlier today.  Her pain is severe, frontal, and dull.  She says that the lights bother her eyes, that she is nauseous, and that she has been vomiting all day.  She says this is typical of her migraines.  She reports having migraines for several years but notes that they have worsened over the past few months.  She denies fever and neck stiffness.  She does not think she is pregnant.  She denies having any recent falls.  She says that her neck has "knots" in it.  Nothing has alleviated her pain. Past Medical History:  Diagnosis Date  . Anemia   . Asthma    hx chronic bronchitis/exercise induced asthma as pre teen only  . Environmental allergies   . Infection    UTI  . Migraine   . Seasonal allergies   . Wrist fracture, right     Patient Active Problem List   Diagnosis Date Noted  . Acute pain of left knee 05/24/2017  . Pain in right wrist 05/24/2017  . Mirena intrauterine device in place 03/02/2016    Past Surgical History:  Procedure Laterality Date  . CESAREAN SECTION  2008  . CESAREAN SECTION N/A 06/15/2013   Procedure: CESAREAN SECTION;  Surgeon: Allyn Kenner, DO;  Location: [facility];  Service: Obstetrics;  Laterality: N/A;  . DILATION AND CURETTAGE OF UTERUS  08/2011  . FOOT SURGERY  2017  . INDUCED ABORTION       OB History    Gravida  5   Para  2   Term  2   Preterm      AB  3   Living  2     SAB      TAB  2   Ectopic      Multiple      Live Births  1            Home Medications    Prior to Admission medications   Medication Sig  Start Date End Date Taking? Authorizing Provider  hydrocortisone (ANUSOL-HC) 2.5 % rectal cream Apply rectally 2 times daily 06/30/18   Loura Halt A, NP  Levonorgestrel (MIRENA, 52 MG, IU) by Intrauterine route.    [provider]    Family History Family History  Problem Relation Age of Onset  . Cancer Father        liver  . Diabetes Father   . Hypertension Father   . Hypertension Mother   . Hypertension Maternal Grandmother   . Kidney disease Maternal Grandmother   . Diabetes Paternal Grandmother   . Hypertension Paternal Grandmother     Social History Social History   Tobacco Use  . Smoking status: Current Every Day Smoker    Packs/day: 0.50    Types: Cigarettes  . Smokeless tobacco: Never Used  . Tobacco comment: quit in April 2014  Substance Use Topics  . Alcohol use: No    Comment: occassionally   . Drug use: Yes    Types: Marijuana    Comment: Last use 06/11/18  Allergies   Latex   Review of Systems Review of Systems Review of Systems   Constitutional  Negative for fever  Negative for chills  HENT  Negative for ear pain  Negative for sore throat  Negative for difficultly swallowing  Eyes  Negative for eye pain  Negative for visual disturbance  +photophobia  Respiratory  Negative for shortness of breath  Negative for cough  CV  Negative for chest pain  Negative for leg swelling  Abdomen  Negative for abdominal pain  +for nausea  +for vomiting  MSK  Negative for extremity pain  Negative for back pain  Skin  Negative for rash  Negative for wound  Neuro  Negative for syncope  Negative for difficultly speaking  +headache  Psych  Negative for confusion   The remainder of the ROS was reviewed and negative except as documented above.      Physical Exam Updated Vital Signs BP 120/69 (BP Location: Right Arm)   Pulse 68   Temp 98 F (36.7 C) (Oral)   Resp 16   SpO2 100%   Physical Exam Physical  Exam Constitutional  Nursing notes reviewed  Vital signs reviewed  Uncomfortable appearing  HEENT  No obvious trauma  Supple without meningismus, mass, or overt JVD  EOMI  Photophobic  No scleral icterus or injection  Respiratory  Effort normal  CTAB  No respiratory distress  CV  Normal rate  No obvious murmurs  No pitting edema  Abdomen  Soft  Non-tender  Non-distended  No peritonitis  MSK  Atraumatic  No obvious deformity  ROM appropriate  Skin  Warm  Dry  Neuro  Awake and alert  EOMI, PERRL  Moving all extremities  Denies weakness/tingling  No meningismus  Photophobic  Psychiatric  Mood and affect normal        ED Treatments / Results  Labs (all labs ordered are listed, but only abnormal results are displayed) Labs Reviewed  POC URINE PREG, ED    EKG None  Radiology No results found.  Procedures Procedures (including critical care time)  Medications Ordered in ED Medications  promethazine (PHENERGAN) injection 25 mg (25 mg Intravenous Given 07/10/18 1751)  diphenhydrAMINE (BENADRYL) injection 12.5 mg (12.5 mg Intravenous Given 07/10/18 1746)  dexamethasone (DECADRON) injection 10 mg (10 mg Intravenous Given 07/10/18 1747)  lactated ringers bolus 1,000 mL (0 mLs Intravenous Stopped 07/10/18 2000)     Initial Impression / Assessment and Plan / ED Course  I have reviewed the triage vital signs and the nursing notes.  Pertinent labs & imaging results that were available during my care of the patient were reviewed by me and considered in my medical decision making (see chart for details).     Michele Avila presents due to a headache as per above.  She has a history of migraines.  This is consistent with her prior migraines.  She has no focal neurological deficits.  She can ambulate without difficulty.  She has no fever or meningismus.  She has no scleral injection or visual changes.  She has no temporal tenderness.  I do  not suspect acute intracranial injury, meningitis/encephalitis, idiopathic intracranial hypertension, acute angle-closure glaucoma, mass/tumor, or cavernous sinus thrombosis as the cause of her headache.  I suspect that this is her typical migraine.  She was given IV fluids, Benadryl, promethazine, Decadron, and Norflex.  She reported significant improvement after this.  She said that she would like to be discharged home to finish recovering.  She  also requested neurology follow-up.  I believe that she is safe and appropriate for discharge home with outpatient neurology follow-up.  I provided ED return precautions. Final Clinical Impressions(s) / ED Diagnoses   Final diagnoses:  Other migraine without status migrainosus, intractable    ED Discharge Orders    None       Alford Highland, MD 07/10/18 2257    Elnora Morrison, MD 07/11/18 [phone]

## 2018-07-12 ENCOUNTER — Other Ambulatory Visit (INDEPENDENT_AMBULATORY_CARE_PROVIDER_SITE_OTHER): Payer: Medicaid Other

## 2018-07-12 ENCOUNTER — Ambulatory Visit (INDEPENDENT_AMBULATORY_CARE_PROVIDER_SITE_OTHER): Payer: Medicaid Other | Admitting: Neurology

## 2018-07-12 ENCOUNTER — Encounter: Payer: Self-pay | Admitting: Neurology

## 2018-07-12 VITALS — BP 108/74 | HR 81 | Ht 68.5 in | Wt 225.0 lb

## 2018-07-12 DIAGNOSIS — G43019 Migraine without aura, intractable, without status migrainosus: Secondary | ICD-10-CM | POA: Diagnosis not present

## 2018-07-12 DIAGNOSIS — Z79899 Other long term (current) drug therapy: Secondary | ICD-10-CM

## 2018-07-12 DIAGNOSIS — F17200 Nicotine dependence, unspecified, uncomplicated: Secondary | ICD-10-CM | POA: Diagnosis not present

## 2018-07-12 LAB — BASIC METABOLIC PANEL
BUN: 20 mg/dL (ref 6–23)
CHLORIDE: 104 meq/L (ref 96–112)
CO2: 30 mEq/L (ref 19–32)
Calcium: 9.6 mg/dL (ref 8.4–10.5)
Creatinine, Ser: 0.94 mg/dL (ref 0.40–1.20)
GFR: 76.31 mL/min (ref >60.00)
GLUCOSE: 104 mg/dL — AB (ref 70–99)
POTASSIUM: 3.8 meq/L (ref 3.5–5.1)
Sodium: 141 mEq/L (ref 135–145)

## 2018-07-12 MED ORDER — ONDANSETRON 4 MG PO TBDP: 4.0000 mg | ORAL_TABLET | Freq: Three times a day (TID) | ORAL | 3 refills | Status: DC | PRN

## 2018-07-12 MED ORDER — TOPIRAMATE 50 MG PO TABS: 50.0000 mg | ORAL_TABLET | Freq: Every day | ORAL | 3 refills | Status: DC

## 2018-07-12 MED ORDER — NAPROXEN 500 MG PO TABS: 500.0000 mg | ORAL_TABLET | Freq: Two times a day (BID) | ORAL | 3 refills | Status: DC | PRN

## 2018-07-12 MED ORDER — SUMATRIPTAN SUCCINATE 100 MG PO TABS: ORAL_TABLET | ORAL | 3 refills | Status: DC
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
## 2018-07-12 NOTE — Patient Instructions (Addendum)
Migraine Recommendations: 1.  Start topiramate 50mg  at bedtime.  Contact in 4 weeks with update and we can adjust dose if needed. 2.  Take sumatriptan 100mg  at earliest onset of headache.  May repeat dose once in 2 hours if needed.  Do not exceed two tablets in 24 hours.  If you wake up with migraine, take sumatriptan 100mg  with naproxen 500mg .  May repeat sumatriptan with naproxen once in 2 hours if needed (no more than 2 doses in 24 hours).  Ondanestron as directed for nausea. 3.  STOP IBUPROFEN.  Limit use of pain relievers to no more than 2 days out of the week.  These medications include acetaminophen, ibuprofen, triptans and narcotics.  This will help reduce risk of rebound headaches. 4.  Be aware of common food triggers such as processed sweets, processed foods with nitrites (such as deli meat, hot dogs, sausages), foods with MSG, alcohol (such as wine), chocolate, certain cheeses, certain fruits (dried fruits, bananas, some citrus fruit), vinegar, diet soda. 4.  Avoid caffeine 5.  Routine exercise 6.  Proper sleep hygiene 7.  Stay adequately hydrated with water 8.  Keep a headache diary. 9.  Maintain proper stress management. 10.  Do not skip meals. 11.  Consider supplements:  Magnesium citrate 400mg  to 600mg  daily, riboflavin 400mg , Coenzyme Q 10 100mg  three times daily 12.  I RECOMMEND REMOVING THE MIRENA 13. We will check a BMP 13.  Follow up in 3 to 4 months.  Your provider has requested that you have labwork completed today. Please go to [facility] ([id number]) on the second floor of this building before leaving the office today. You do not need to check in. If you are not called within 15 minutes please check with the front desk.
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
## 2018-10-12 ENCOUNTER — Ambulatory Visit: Payer: Medicaid Other | Admitting: Neurology

## 2018-10-13 ENCOUNTER — Ambulatory Visit (INDEPENDENT_AMBULATORY_CARE_PROVIDER_SITE_OTHER): Payer: Medicaid Other | Admitting: Neurology

## 2018-10-13 ENCOUNTER — Encounter: Payer: Self-pay | Admitting: Neurology

## 2018-10-13 VITALS — BP 110/80 | HR 90 | Ht 69.0 in | Wt 220.0 lb

## 2018-10-13 DIAGNOSIS — G43009 Migraine without aura, not intractable, without status migrainosus: Secondary | ICD-10-CM

## 2018-10-13 MED ORDER — TOPIRAMATE 25 MG PO TABS: 75.0000 mg | ORAL_TABLET | Freq: Every day | ORAL | 3 refills | Status: DC

## 2018-10-13 NOTE — Patient Instructions (Signed)
1.  Increase topiramate to 75mg  at bedtime 2.  Continue sumatriptan 100mg  (with naproxen if you wake up with migraine) 3.  Limit use of pain relievers to no more than 2 days out of week to prevent risk of rebound or medication-overuse headache. 4.  Keep headache diary 5.  Follow up in 3 to 4 months
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
## 2018-11-28 ENCOUNTER — Other Ambulatory Visit: Payer: Self-pay

## 2018-11-28 ENCOUNTER — Encounter (HOSPITAL_COMMUNITY): Payer: Self-pay

## 2018-11-28 ENCOUNTER — Ambulatory Visit (HOSPITAL_COMMUNITY)
Admission: EM | Admit: 2018-11-28 | Discharge: 2018-11-28 | Disposition: A | Discharge status: Home / Self Care | Payer: Medicaid Other | Attending: Internal Medicine | Admitting: Internal Medicine

## 2018-11-28 DIAGNOSIS — J069 Acute upper respiratory infection, unspecified: Secondary | ICD-10-CM | POA: Insufficient documentation

## 2018-11-28 DIAGNOSIS — B9789 Other viral agents as the cause of diseases classified elsewhere: Secondary | ICD-10-CM | POA: Insufficient documentation

## 2018-11-28 NOTE — ED Triage Notes (Signed)
Pt cc coughing a lot last night. Pt states she was out in the night air. Pt states she felt SOB.

## 2018-11-28 NOTE — Discharge Instructions (Addendum)
Believe this is a viral upper respiratory infection You can do Mucinex for cough and congestion Zyrtec for nasal drainage and congestion Tylenol or ibuprofen for pain Follow up as needed for continued or worsening symptoms

## 2018-11-28 NOTE — ED Provider Notes (Signed)
[facility]    CSN: [id number] Arrival date & time: 11/28/18  1529     History   Chief Complaint No chief complaint on file.   HPI Michele Avila is a 28 y.o. female.   Patient is a 28 year old female with past medical history of asthma, anemia, allergies, migraines.  She presents with approximately 1 day of cough, congestion, runny nose, sore throat.  Her symptoms have been constant and remain the same.  She has not taken anything for symptoms.  Her son has been sick with similar symptoms.  He was diagnosed with viral illness.  She denies any recent traveling.  No LMP recorded. (Menstrual status: IUD). Denies any nausea, vomiting, diarrhea.  ROS per HPI       Past Medical History:  Diagnosis Date  . Anemia   . Asthma    hx chronic bronchitis/exercise induced asthma as pre teen only  . Environmental allergies   . Infection    UTI  . Migraine   . Seasonal allergies   . Wrist fracture, right     Patient Active Problem List   Diagnosis Date Noted  . Acute pain of left knee 05/24/2017  . Pain in right wrist 05/24/2017  . Mirena intrauterine device in place 03/02/2016    Past Surgical History:  Procedure Laterality Date  . CESAREAN SECTION  2008  . CESAREAN SECTION N/A 06/15/2013   Procedure: CESAREAN SECTION;  Surgeon: Allyn Kenner, DO;  Location: [facility];  Service: Obstetrics;  Laterality: N/A;  . DILATION AND CURETTAGE OF UTERUS  08/2011  . FOOT SURGERY  2017  . INDUCED ABORTION      OB History    Gravida  5   Para  2   Term  2   Preterm      AB  3   Living  2     SAB      TAB  2   Ectopic      Multiple      Live Births  1            Home Medications    Prior to Admission medications   Medication Sig Start Date End Date Taking? Authorizing Provider  hydrocortisone (ANUSOL-HC) 2.5 % rectal cream Apply rectally 2 times daily 06/30/18   Loura Halt A, NP  Levonorgestrel (MIRENA, 52 MG, IU) by Intrauterine route.     [provider]  naproxen (NAPROSYN) 500 MG tablet Take 1 tablet (500 mg total) by mouth every 12 (twelve) hours as needed. 07/12/18   Tomi Likens, Adam R, DO  ondansetron (ZOFRAN ODT) 4 MG disintegrating tablet Take 1 tablet (4 mg total) by mouth every 8 (eight) hours as needed for nausea or vomiting. 07/12/18   Pieter Partridge, DO  SUMAtriptan (IMITREX) 100 MG tablet Take 1 tablet earliest onset of migraine.  May repeat x1 in 2 hours if headache persists or recurs.  Do not exceed 2 tablets in 24h 07/12/18   Tomi Likens, Adam R, DO  topiramate (TOPAMAX) 25 MG tablet Take 3 tablets (75 mg total) by mouth at bedtime. 10/13/18   Pieter Partridge, DO    Family History Family History  Problem Relation Age of Onset  . Cancer Father        liver  . Diabetes Father   . Hypertension Father   . Hypertension Mother   . Hypertension Maternal Grandmother   . Kidney disease Maternal Grandmother   . Diabetes Paternal Grandmother   . Hypertension  Paternal Grandmother   . Ulcers Brother   . Crohn's disease Brother     Social History Social History   Tobacco Use  . Smoking status: Current Every Day Smoker    Packs/day: 0.50    Types: Cigarettes  . Smokeless tobacco: Never Used  . Tobacco comment: quit in April 2014  Substance Use Topics  . Alcohol use: Yes    Alcohol/week: 1.0 standard drinks    Types: 1 Shots of liquor per week    Comment: occassionally   . Drug use: Yes    Types: Marijuana    Comment: Last use 06/11/18     Allergies   Latex   Review of Systems Review of Systems   Physical Exam Triage Vital Signs ED Triage Vitals  Enc Vitals Group     BP 11/28/18 1618 117/79     Pulse --      Resp 11/28/18 1618 18     Temp 11/28/18 1618 97.8 F (36.6 C)     Temp Source 11/28/18 1618 Oral     SpO2 11/28/18 1618 98 %     Weight 11/28/18 1615 230 lb (104.3 kg)     Height --      Head Circumference --      Peak Flow --      Pain Score 11/28/18 1615 6     Pain Loc --      Pain Edu?  --      Excl. in [place]? --    No data found.  Updated Vital Signs BP 117/79 (BP Location: Right Arm)   Temp 97.8 F (36.6 C) (Oral)   Resp 18   Wt 230 lb (104.3 kg)   SpO2 98%   BMI 33.97 kg/m   Visual Acuity Right Eye Distance:   Left Eye Distance:   Bilateral Distance:    Right Eye Near:   Left Eye Near:    Bilateral Near:     Physical Exam Vitals signs and nursing note reviewed.  Constitutional:      Appearance: Normal appearance. She is not ill-appearing or toxic-appearing.  HENT:     Head: Normocephalic and atraumatic.     Right Ear: Tympanic membrane, ear canal and external ear normal.     Left Ear: Tympanic membrane, ear canal and external ear normal.     Nose: Congestion and rhinorrhea present.     Mouth/Throat:     Pharynx: Oropharynx is clear. No posterior oropharyngeal erythema.  Eyes:     Conjunctiva/sclera: Conjunctivae normal.  Neck:     Musculoskeletal: Normal range of motion and neck supple.  Cardiovascular:     Rate and Rhythm: Normal rate and regular rhythm.     Pulses: Normal pulses.     Heart sounds: Normal heart sounds.  Pulmonary:     Effort: Pulmonary effort is normal.     Breath sounds: Normal breath sounds.  Musculoskeletal: Normal range of motion.  Lymphadenopathy:     Cervical: No cervical adenopathy.  Skin:    General: Skin is warm and dry.  Neurological:     Mental Status: She is alert.  Psychiatric:        Mood and Affect: Mood normal.      UC Treatments / Results  Labs (all labs ordered are listed, but only abnormal results are displayed) Labs Reviewed - No data to display  EKG None  Radiology No results found.  Procedures Procedures (including critical care time)  Medications Ordered in UC Medications -  No data to display  Initial Impression / Assessment and Plan / UC Course  I have reviewed the triage vital signs and the nursing notes.  Pertinent labs & imaging results that were available during my care of  the patient were reviewed by me and considered in my medical decision making (see chart for details).     Viral URI Mucinex, Flonase and Zyrtec for symptoms Follow up as needed for continued or worsening symptoms  Final Clinical Impressions(s) / UC Diagnoses   Final diagnoses:  Viral URI with cough     Discharge Instructions     Believe this is a viral upper respiratory infection You can do Mucinex for cough and congestion Zyrtec for nasal drainage and congestion Tylenol or ibuprofen for pain Follow up as needed for continued or worsening symptoms     ED Prescriptions    None     Controlled Substance Prescriptions [place] Controlled Substance Registry consulted? Not Applicable   Orvan July, NP 11/28/18 1709

## 2018-12-03 ENCOUNTER — Emergency Department (HOSPITAL_COMMUNITY)
Admission: EM | Admit: 2018-12-03 | Discharge: 2018-12-04 | Disposition: A | Discharge status: Home / Self Care | Payer: Medicaid Other | Attending: Emergency Medicine | Admitting: Emergency Medicine

## 2018-12-03 DIAGNOSIS — Y998 Other external cause status: Secondary | ICD-10-CM | POA: Diagnosis not present

## 2018-12-03 DIAGNOSIS — Z91040 Latex allergy status: Secondary | ICD-10-CM | POA: Insufficient documentation

## 2018-12-03 DIAGNOSIS — Z79899 Other long term (current) drug therapy: Secondary | ICD-10-CM | POA: Insufficient documentation

## 2018-12-03 DIAGNOSIS — S76012A Strain of muscle, fascia and tendon of left hip, initial encounter: Secondary | ICD-10-CM | POA: Insufficient documentation

## 2018-12-03 DIAGNOSIS — S76092A Other specified injury of muscle, fascia and tendon of left hip, initial encounter: Secondary | ICD-10-CM | POA: Diagnosis not present

## 2018-12-03 DIAGNOSIS — M25552 Pain in left hip: Secondary | ICD-10-CM | POA: Diagnosis not present

## 2018-12-03 DIAGNOSIS — F17210 Nicotine dependence, cigarettes, uncomplicated: Secondary | ICD-10-CM | POA: Insufficient documentation

## 2018-12-03 DIAGNOSIS — J45909 Unspecified asthma, uncomplicated: Secondary | ICD-10-CM | POA: Insufficient documentation

## 2018-12-03 DIAGNOSIS — Y929 Unspecified place or not applicable: Secondary | ICD-10-CM | POA: Insufficient documentation

## 2018-12-03 DIAGNOSIS — F1210 Cannabis abuse, uncomplicated: Secondary | ICD-10-CM | POA: Diagnosis not present

## 2018-12-03 DIAGNOSIS — Y939 Activity, unspecified: Secondary | ICD-10-CM | POA: Insufficient documentation

## 2018-12-03 DIAGNOSIS — X58XXXA Exposure to other specified factors, initial encounter: Secondary | ICD-10-CM | POA: Diagnosis not present

## 2018-12-03 DIAGNOSIS — S79912A Unspecified injury of left hip, initial encounter: Secondary | ICD-10-CM | POA: Diagnosis present

## 2018-12-04 ENCOUNTER — Emergency Department (HOSPITAL_COMMUNITY): Payer: Medicaid Other

## 2018-12-04 ENCOUNTER — Other Ambulatory Visit: Payer: Self-pay

## 2018-12-04 ENCOUNTER — Encounter (HOSPITAL_COMMUNITY): Payer: Self-pay

## 2018-12-04 DIAGNOSIS — M25552 Pain in left hip: Secondary | ICD-10-CM | POA: Diagnosis not present

## 2018-12-04 MED ORDER — CYCLOBENZAPRINE HCL 10 MG PO TABS: 10.0000 mg | ORAL_TABLET | Freq: Three times a day (TID) | ORAL | 0 refills | Status: DC | PRN

## 2018-12-04 MED ORDER — KETOROLAC TROMETHAMINE 30 MG/ML IJ SOLN
30.0000 mg | Freq: Once | INTRAMUSCULAR | Status: AC
  Administered 2018-12-04: 30 mg via INTRAMUSCULAR
  Filled 2018-12-04: qty 1

## 2018-12-04 MED ORDER — DEXAMETHASONE SODIUM PHOSPHATE 10 MG/ML IJ SOLN
10.0000 mg | Freq: Once | INTRAMUSCULAR | Status: AC
  Administered 2018-12-04: 10 mg via INTRAMUSCULAR
  Filled 2018-12-04: qty 1

## 2018-12-04 MED ORDER — IBUPROFEN 800 MG PO TABS: 800.0000 mg | ORAL_TABLET | Freq: Four times a day (QID) | ORAL | 0 refills | Status: DC | PRN

## 2018-12-04 NOTE — ED Provider Notes (Signed)
[facility] EMERGENCY DEPARTMENT Provider Note   CSN: [id number] Arrival date & time: 12/03/18  2351     History   Chief Complaint Chief Complaint  Patient presents with  . Hip Pain    HPI Michele Avila is a 28 y.o. female.  Patient presents to the emergency department for evaluation of left hip pain.  Patient reports that she has lately been feeling pain in the left hip.  She reports that when she moves it sometimes it "pops".  Pain is now severe.  She reports pain in the left groin that radiates around the left side of the lateral portion of the hip, worsens with movement and trying to walk.  She denies any direct injury.     Past Medical History:  Diagnosis Date  . Anemia   . Asthma    hx chronic bronchitis/exercise induced asthma as pre teen only  . Environmental allergies   . Infection    UTI  . Migraine   . Seasonal allergies   . Wrist fracture, right     Patient Active Problem List   Diagnosis Date Noted  . Acute pain of left knee 05/24/2017  . Pain in right wrist 05/24/2017  . Mirena intrauterine device in place 03/02/2016    Past Surgical History:  Procedure Laterality Date  . CESAREAN SECTION  2008  . CESAREAN SECTION N/A 06/15/2013   Procedure: CESAREAN SECTION;  Surgeon: Allyn Kenner, DO;  Location: [facility];  Service: Obstetrics;  Laterality: N/A;  . DILATION AND CURETTAGE OF UTERUS  08/2011  . FOOT SURGERY  2017  . INDUCED ABORTION       OB History    Gravida  5   Para  2   Term  2   Preterm      AB  3   Living  2     SAB      TAB  2   Ectopic      Multiple      Live Births  1            Home Medications    Prior to Admission medications   Medication Sig Start Date End Date Taking? Authorizing Provider  Levonorgestrel (MIRENA, 52 MG, IU) 1 each by Intrauterine route once.    Yes [provider]  naproxen (NAPROSYN) 500 MG tablet Take 1 tablet (500 mg total) by mouth every 12 (twelve)  hours as needed. Patient taking differently: Take 500 mg by mouth every 12 (twelve) hours as needed for mild pain.  07/12/18  Yes Jaffe, Adam R, DO  ondansetron (ZOFRAN ODT) 4 MG disintegrating tablet Take 1 tablet (4 mg total) by mouth every 8 (eight) hours as needed for nausea or vomiting. 07/12/18  Yes Tomi Likens, Adam R, DO  SUMAtriptan (IMITREX) 100 MG tablet Take 1 tablet earliest onset of migraine.  May repeat x1 in 2 hours if headache persists or recurs.  Do not exceed 2 tablets in 24h Patient taking differently: Take 100 mg by mouth every 2 (two) hours as needed for migraine. Take 1 tablet earliest onset of migraine.  May repeat x1 in 2 hours if headache persists or recurs.  Do not exceed 2 tablets in 24h 07/12/18  Yes Jaffe, Adam R, DO  topiramate (TOPAMAX) 25 MG tablet Take 3 tablets (75 mg total) by mouth at bedtime. 10/13/18  Yes Jaffe, Adam R, DO  cyclobenzaprine (FLEXERIL) 10 MG tablet Take 1 tablet (10 mg total) by mouth 3 (three)  times daily as needed for muscle spasms. 12/04/18   Orpah Greek, MD  hydrocortisone (ANUSOL-HC) 2.5 % rectal cream Apply rectally 2 times daily Patient not taking: Reported on 12/04/2018 06/30/18   Loura Halt A, NP  ibuprofen (ADVIL,MOTRIN) 800 MG tablet Take 1 tablet (800 mg total) by mouth every 6 (six) hours as needed for moderate pain. 12/04/18   Orpah Greek, MD    Family History Family History  Problem Relation Age of Onset  . Cancer Father        liver  . Diabetes Father   . Hypertension Father   . Hypertension Mother   . Hypertension Maternal Grandmother   . Kidney disease Maternal Grandmother   . Diabetes Paternal Grandmother   . Hypertension Paternal Grandmother   . Ulcers Brother   . Crohn's disease Brother     Social History Social History   Tobacco Use  . Smoking status: Current Every Day Smoker    Packs/day: 0.50    Types: Cigarettes  . Smokeless tobacco: Never Used  . Tobacco comment: quit in April 2014  Substance  Use Topics  . Alcohol use: Yes    Alcohol/week: 1.0 standard drinks    Types: 1 Shots of liquor per week    Comment: occassionally   . Drug use: Yes    Types: Marijuana    Comment: Last use 06/11/18     Allergies   Latex   Review of Systems Review of Systems  Musculoskeletal: Positive for arthralgias.  All other systems reviewed and are negative.    Physical Exam Updated Vital Signs There were no vitals taken for this visit.  Physical Exam Constitutional:      Appearance: Normal appearance.  HENT:     Head: Normocephalic and atraumatic.  Eyes:     Pupils: Pupils are equal, round, and reactive to light.  Cardiovascular:     Rate and Rhythm: Normal rate and regular rhythm.  Pulmonary:     Effort: Pulmonary effort is normal.     Breath sounds: Normal breath sounds.  Abdominal:     Palpations: Abdomen is soft.     Tenderness: There is no abdominal tenderness.  Musculoskeletal:     Lumbar back: She exhibits no tenderness.     Comments: Left hip, no deformity.  Decreased range of motion of left hip secondary to painful inhibition  Skin:    General: Skin is warm and dry.     Findings: No rash.  Neurological:     Mental Status: She is alert.      ED Treatments / Results  Labs (all labs ordered are listed, but only abnormal results are displayed) Labs Reviewed - No data to display  EKG None  Radiology Dg Hip Unilat W Or Wo Pelvis 2-3 Views Left  Result Date: 12/04/2018 CLINICAL DATA:  Hip pain EXAM: DG HIP (WITH OR WITHOUT PELVIS) 2-3V LEFT COMPARISON:  None. FINDINGS: SI joints are patent. Pubic symphysis and rami are intact. IUD in the pelvis. No fracture or malalignment. IMPRESSION: No acute osseous abnormality Electronically Signed   By: Donavan Foil M.D.   On: 12/04/2018 00:41    Procedures Procedures (including critical care time)  Medications Ordered in ED Medications  ketorolac (TORADOL) 30 MG/ML injection 30 mg (has no administration in time  range)  dexamethasone (DECADRON) injection 10 mg (has no administration in time range)     Initial Impression / Assessment and Plan / ED Course  I have reviewed the triage  vital signs and the nursing notes.  Pertinent labs & imaging results that were available during my care of the patient were reviewed by me and considered in my medical decision making (see chart for details).     Patient presents to the emergency department for evaluation of pain in the left hip area.  Patient experiencing pain more in the groin that worsens when she tries to walk or lift her leg.  This seems more consistent with a hip flexor injury then bone etiology.  X-ray is negative.  She does not have back pain, this is not sciatica.  Abdominal exam is benign, nontender.  We will treat for hip flexor strain, follow-up with Ortho if not improving in several days.  Final Clinical Impressions(s) / ED Diagnoses   Final diagnoses:  Strain of flexor muscle of left hip, initial encounter    ED Discharge Orders         Ordered    ibuprofen (ADVIL,MOTRIN) 800 MG tablet  Every 6 hours PRN     12/04/18 0202    cyclobenzaprine (FLEXERIL) 10 MG tablet  3 times daily PRN     12/04/18 0202           Orpah Greek, MD 12/04/18 0202

## 2018-12-04 NOTE — ED Triage Notes (Signed)
Pt here for left hip pain and states "I feel like its out of socket".  Pt A&Ox4.  No other complaints.

## 2018-12-12 ENCOUNTER — Ambulatory Visit (HOSPITAL_COMMUNITY)
Admission: EM | Admit: 2018-12-12 | Discharge: 2018-12-12 | Disposition: A | Discharge status: Home / Self Care | Payer: Medicaid Other | Attending: Family Medicine | Admitting: Family Medicine

## 2018-12-12 ENCOUNTER — Other Ambulatory Visit: Payer: Self-pay

## 2018-12-12 ENCOUNTER — Encounter (HOSPITAL_COMMUNITY): Payer: Self-pay

## 2018-12-12 DIAGNOSIS — J209 Acute bronchitis, unspecified: Secondary | ICD-10-CM | POA: Diagnosis not present

## 2018-12-12 MED ORDER — ALBUTEROL SULFATE HFA 108 (90 BASE) MCG/ACT IN AERS: 1.0000 | INHALATION_SPRAY | Freq: Four times a day (QID) | RESPIRATORY_TRACT | 0 refills | Status: DC | PRN

## 2018-12-12 MED ORDER — AZITHROMYCIN 250 MG PO TABS: ORAL_TABLET | ORAL | 0 refills | Status: DC

## 2018-12-12 NOTE — ED Provider Notes (Signed)
[facility]    CSN: [id number] Arrival date & time: 12/12/18  1637     History   Chief Complaint Chief Complaint  Patient presents with  . Cough    HPI Michele Avila is a 28 y.o. female.  3-week history of nonproductive cough.  Patient is a smoker.  Past history of asthma but does not have inhalers anymore.  She tells me she has asked for inhaler without results   HPI  Past Medical History:  Diagnosis Date  . Anemia   . Asthma    hx chronic bronchitis/exercise induced asthma as pre teen only  . Environmental allergies   . Infection    UTI  . Migraine   . Seasonal allergies   . Wrist fracture, right     Patient Active Problem List   Diagnosis Date Noted  . Acute pain of left knee 05/24/2017  . Pain in right wrist 05/24/2017  . Mirena intrauterine device in place 03/02/2016    Past Surgical History:  Procedure Laterality Date  . CESAREAN SECTION  2008  . CESAREAN SECTION N/A 06/15/2013   Procedure: CESAREAN SECTION;  Surgeon: Allyn Kenner, DO;  Location: [facility];  Service: Obstetrics;  Laterality: N/A;  . DILATION AND CURETTAGE OF UTERUS  08/2011  . FOOT SURGERY  2017  . INDUCED ABORTION      OB History    Gravida  5   Para  2   Term  2   Preterm      AB  3   Living  2     SAB      TAB  2   Ectopic      Multiple      Live Births  1            Home Medications    Prior to Admission medications   Medication Sig Start Date End Date Taking? Authorizing Provider  albuterol (PROVENTIL HFA;VENTOLIN HFA) 108 (90 Base) MCG/ACT inhaler Inhale 1-2 puffs into the lungs every 6 (six) hours as needed for wheezing or shortness of breath. 12/12/18   Wardell Honour, MD  azithromycin (ZITHROMAX Z-PAK) 250 MG tablet Take as directed 12/12/18   Wardell Honour, MD  cyclobenzaprine (FLEXERIL) 10 MG tablet Take 1 tablet (10 mg total) by mouth 3 (three) times daily as needed for muscle spasms. 12/04/18   Orpah Greek, MD    hydrocortisone (ANUSOL-HC) 2.5 % rectal cream Apply rectally 2 times daily Patient not taking: Reported on 12/04/2018 06/30/18   Loura Halt A, NP  ibuprofen (ADVIL,MOTRIN) 800 MG tablet Take 1 tablet (800 mg total) by mouth every 6 (six) hours as needed for moderate pain. 12/04/18   Orpah Greek, MD  Levonorgestrel (MIRENA, 52 MG, IU) 1 each by Intrauterine route once.     [provider]  naproxen (NAPROSYN) 500 MG tablet Take 1 tablet (500 mg total) by mouth every 12 (twelve) hours as needed. Patient taking differently: Take 500 mg by mouth every 12 (twelve) hours as needed for mild pain.  07/12/18   Tomi Likens, Adam R, DO  ondansetron (ZOFRAN ODT) 4 MG disintegrating tablet Take 1 tablet (4 mg total) by mouth every 8 (eight) hours as needed for nausea or vomiting. 07/12/18   Pieter Partridge, DO  SUMAtriptan (IMITREX) 100 MG tablet Take 1 tablet earliest onset of migraine.  May repeat x1 in 2 hours if headache persists or recurs.  Do not exceed 2 tablets in 24h Patient  taking differently: Take 100 mg by mouth every 2 (two) hours as needed for migraine. Take 1 tablet earliest onset of migraine.  May repeat x1 in 2 hours if headache persists or recurs.  Do not exceed 2 tablets in 24h 07/12/18   Tomi Likens, Adam R, DO  topiramate (TOPAMAX) 25 MG tablet Take 3 tablets (75 mg total) by mouth at bedtime. 10/13/18   Pieter Partridge, DO    Family History Family History  Problem Relation Age of Onset  . Cancer Father        liver  . Diabetes Father   . Hypertension Father   . Hypertension Mother   . Hypertension Maternal Grandmother   . Kidney disease Maternal Grandmother   . Diabetes Paternal Grandmother   . Hypertension Paternal Grandmother   . Ulcers Brother   . Crohn's disease Brother     Social History Social History   Tobacco Use  . Smoking status: Current Every Day Smoker    Packs/day: 0.50    Types: Cigarettes  . Smokeless tobacco: Never Used  . Tobacco comment: quit in April  2014  Substance Use Topics  . Alcohol use: Yes    Alcohol/week: 1.0 standard drinks    Types: 1 Shots of liquor per week    Comment: occassionally   . Drug use: Yes    Types: Marijuana    Comment: Last use 06/11/18     Allergies   Latex   Review of Systems Review of Systems  Constitutional: Negative.   HENT: Positive for congestion.   Respiratory: Positive for cough and shortness of breath.   Cardiovascular: Negative.   Gastrointestinal: Negative.   All other systems reviewed and are negative.    Physical Exam Triage Vital Signs ED Triage Vitals  Enc Vitals Group     BP 12/12/18 1730 132/75     Pulse Rate 12/12/18 1730 100     Resp 12/12/18 1730 16     Temp 12/12/18 1730 98.1 F (36.7 C)     Temp Source 12/12/18 1730 Oral     SpO2 12/12/18 1730 100 %     Weight 12/12/18 1731 230 lb (104.3 kg)     Height --      Head Circumference --      Peak Flow --      Pain Score 12/12/18 1730 9     Pain Loc --      Pain Edu? --      Excl. in [place]? --    No data found.  Updated Vital Signs BP 132/75 (BP Location: Right Arm)   Pulse 100   Temp 98.1 F (36.7 C) (Oral)   Resp 16   Wt 104.3 kg   SpO2 100%   BMI 33.97 kg/m   Visual Acuity Right Eye Distance:   Left Eye Distance:   Bilateral Distance:    Right Eye Near:   Left Eye Near:    Bilateral Near:     Physical Exam Constitutional:      Appearance: Normal appearance. She is obese.  HENT:     Right Ear: Tympanic membrane normal.     Nose: Nose normal.     Mouth/Throat:     Pharynx: Oropharynx is clear.  Cardiovascular:     Rate and Rhythm: Normal rate and regular rhythm.  Pulmonary:     Effort: Pulmonary effort is normal.     Breath sounds: Wheezing present.  Neurological:     General: No focal deficit present.  Mental Status: She is alert and oriented to person, place, and time.      UC Treatments / Results  Labs (all labs ordered are listed, but only abnormal results are displayed) Labs  Reviewed - No data to display  EKG None  Radiology No results found.  Procedures Procedures (including critical care time)  Medications Ordered in UC Medications - No data to display  Initial Impression / Assessment and Plan / UC Course  I have reviewed the triage vital signs and the nursing notes.  Pertinent labs & imaging results that were available during my care of the patient were reviewed by me and considered in my medical decision making (see chart for details).     Cough.  Has lasted about 3 weeks now.  Encouraged her to quit smoking will give her albuterol inhaler for cough.  Consider mycoplasma or some other atypical pneumonia Final Clinical Impressions(s) / UC Diagnoses   Final diagnoses:  Acute bronchitis, unspecified organism   Discharge Instructions   None    ED Prescriptions    Medication Sig Dispense Auth. Provider   azithromycin (ZITHROMAX Z-PAK) 250 MG tablet Take as directed 6 each Wardell Honour, MD   albuterol (PROVENTIL HFA;VENTOLIN HFA) 108 (90 Base) MCG/ACT inhaler Inhale 1-2 puffs into the lungs every 6 (six) hours as needed for wheezing or shortness of breath. Mims, MD     Controlled Substance Prescriptions [place] Controlled Substance Registry consulted? No   Wardell Honour, MD 12/12/18 [phone]

## 2018-12-12 NOTE — ED Triage Notes (Signed)
Pt cc she thinks she has the croup or bronchitis. Pt states she feels awful. X 2 weeks.

## 2018-12-15 DIAGNOSIS — M25551 Pain in right hip: Secondary | ICD-10-CM | POA: Diagnosis not present

## 2019-01-26 DIAGNOSIS — M25552 Pain in left hip: Secondary | ICD-10-CM | POA: Diagnosis not present

## 2019-02-07 ENCOUNTER — Ambulatory Visit: Payer: Medicaid Other | Attending: Orthopedic Surgery | Admitting: Physical Therapy

## 2019-02-10 ENCOUNTER — Other Ambulatory Visit: Payer: Self-pay | Admitting: Neurology

## 2019-02-22 ENCOUNTER — Encounter

## 2019-02-22 ENCOUNTER — Ambulatory Visit: Payer: Medicaid Other | Admitting: Neurology

## 2019-02-24 ENCOUNTER — Emergency Department (HOSPITAL_COMMUNITY)
Admission: EM | Admit: 2019-02-24 | Discharge: 2019-02-24 | Disposition: A | Discharge status: Home / Self Care | Payer: Medicaid Other | Attending: Emergency Medicine | Admitting: Emergency Medicine

## 2019-02-24 ENCOUNTER — Other Ambulatory Visit: Payer: Self-pay

## 2019-02-24 ENCOUNTER — Encounter (HOSPITAL_COMMUNITY): Payer: Self-pay | Admitting: Emergency Medicine

## 2019-02-24 DIAGNOSIS — F17210 Nicotine dependence, cigarettes, uncomplicated: Secondary | ICD-10-CM | POA: Insufficient documentation

## 2019-02-24 DIAGNOSIS — J45909 Unspecified asthma, uncomplicated: Secondary | ICD-10-CM | POA: Diagnosis not present

## 2019-02-24 DIAGNOSIS — Z91040 Latex allergy status: Secondary | ICD-10-CM | POA: Insufficient documentation

## 2019-02-24 DIAGNOSIS — G43901 Migraine, unspecified, not intractable, with status migrainosus: Secondary | ICD-10-CM | POA: Insufficient documentation

## 2019-02-24 DIAGNOSIS — R51 Headache: Secondary | ICD-10-CM | POA: Diagnosis present

## 2019-02-24 LAB — I-STAT BETA HCG BLOOD, ED (MC, WL, AP ONLY): I-stat hCG, quantitative: <5 m[IU]/mL (ref <5)

## 2019-02-24 MED ORDER — DIPHENHYDRAMINE HCL 50 MG/ML IJ SOLN
12.5000 mg | Freq: Once | INTRAMUSCULAR | Status: AC
  Administered 2019-02-24: 12.5 mg via INTRAVENOUS
  Filled 2019-02-24: qty 1

## 2019-02-24 MED ORDER — PROCHLORPERAZINE EDISYLATE 10 MG/2ML IJ SOLN
10.0000 mg | Freq: Once | INTRAMUSCULAR | Status: AC
  Administered 2019-02-24: 10 mg via INTRAVENOUS
  Filled 2019-02-24: qty 2

## 2019-02-24 MED ORDER — SODIUM CHLORIDE 0.9 % IV BOLUS
1000.0000 mL | Freq: Once | INTRAVENOUS | Status: AC
  Administered 2019-02-24: 1000 mL via INTRAVENOUS

## 2019-02-24 MED ORDER — KETOROLAC TROMETHAMINE 30 MG/ML IJ SOLN
30.0000 mg | Freq: Once | INTRAMUSCULAR | Status: AC
  Administered 2019-02-24: 30 mg via INTRAVENOUS
  Filled 2019-02-24: qty 1

## 2019-02-24 NOTE — ED Triage Notes (Signed)
Reports migraine x 3 days.  Neurologist referred to ER.  Patient has been taking sumatriptan and zofran without relief.  Reports photophobia and lightheadedness.  Denies blurred vision.

## 2019-02-24 NOTE — ED Provider Notes (Addendum)
[facility] EMERGENCY DEPARTMENT Provider Note   CSN: [id number] Arrival date & time: 02/24/19  1628    History   Chief Complaint Chief Complaint  Patient presents with  . Migraine    HPI Michele Avila is a 28 y.o. female.     HPI Patient presents with headache.  Has had around 2 or 3 days with a headache this time.  Has had nausea and vomiting.  Has been taking this Imitrex and Zofran without relief.  States she feels more like throwing up when she will lean forward.  States that she had been doing really well with her chronic migraines.  States she had not had a headache in around a month.  Had been on Topamax but states that she missed the last 2 days of it.  No fevers.  States she is been checking to be sure.  No sick contacts.  No vision changes besides photophobia.  Has Mirena in and does not think she is pregnant.  No abdominal pain.  No numbness or weakness. Past Medical History:  Diagnosis Date  . Anemia   . Asthma    hx chronic bronchitis/exercise induced asthma as pre teen only  . Environmental allergies   . Infection    UTI  . Migraine   . Seasonal allergies   . Wrist fracture, right     Patient Active Problem List   Diagnosis Date Noted  . Acute pain of left knee 05/24/2017  . Pain in right wrist 05/24/2017  . Mirena intrauterine device in place 03/02/2016    Past Surgical History:  Procedure Laterality Date  . CESAREAN SECTION  2008  . CESAREAN SECTION N/A 06/15/2013   Procedure: CESAREAN SECTION;  Surgeon: Allyn Kenner, DO;  Location: [facility];  Service: Obstetrics;  Laterality: N/A;  . DILATION AND CURETTAGE OF UTERUS  08/2011  . FOOT SURGERY  2017  . INDUCED ABORTION       OB History    Gravida  5   Para  2   Term  2   Preterm      AB  3   Living  2     SAB      TAB  2   Ectopic      Multiple      Live Births  1            Home Medications    Prior to Admission medications   Medication Sig Start  Date End Date Taking? Authorizing Provider  albuterol (PROVENTIL HFA;VENTOLIN HFA) 108 (90 Base) MCG/ACT inhaler Inhale 1-2 puffs into the lungs every 6 (six) hours as needed for wheezing or shortness of breath. 12/12/18   Wardell Honour, MD  azithromycin (ZITHROMAX Z-PAK) 250 MG tablet Take as directed 12/12/18   Wardell Honour, MD  cyclobenzaprine (FLEXERIL) 10 MG tablet Take 1 tablet (10 mg total) by mouth 3 (three) times daily as needed for muscle spasms. 12/04/18   Orpah Greek, MD  hydrocortisone (ANUSOL-HC) 2.5 % rectal cream Apply rectally 2 times daily Patient not taking: Reported on 12/04/2018 06/30/18   Loura Halt A, NP  ibuprofen (ADVIL,MOTRIN) 800 MG tablet Take 1 tablet (800 mg total) by mouth every 6 (six) hours as needed for moderate pain. 12/04/18   Orpah Greek, MD  Levonorgestrel (MIRENA, 52 MG, IU) 1 each by Intrauterine route once.     [provider]  naproxen (NAPROSYN) 500 MG tablet Take 1 tablet (500 mg total)  by mouth every 12 (twelve) hours as needed. Patient taking differently: Take 500 mg by mouth every 12 (twelve) hours as needed for mild pain.  07/12/18   Tomi Likens, Adam R, DO  ondansetron (ZOFRAN ODT) 4 MG disintegrating tablet Take 1 tablet (4 mg total) by mouth every 8 (eight) hours as needed for nausea or vomiting. 07/12/18   Tomi Likens, Adam R, DO  SUMAtriptan (IMITREX) 100 MG tablet TAKE 1 TABLET AT EARLIEST ONSET OF MIGRAINE. MAY REPEAT ONCE IN 2 HOURS IF HEADACHE PERSISTS OR RECURS. NO MORE THAN 2 TABLETS PER 24 HOURS 02/13/19   Jaffe, Adam R, DO  topiramate (TOPAMAX) 25 MG tablet Take 3 tablets (75 mg total) by mouth at bedtime. 10/13/18   Pieter Partridge, DO    Family History Family History  Problem Relation Age of Onset  . Cancer Father        liver  . Diabetes Father   . Hypertension Father   . Hypertension Mother   . Hypertension Maternal Grandmother   . Kidney disease Maternal Grandmother   . Diabetes Paternal Grandmother   .  Hypertension Paternal Grandmother   . Ulcers Brother   . Crohn's disease Brother     Social History Social History   Tobacco Use  . Smoking status: Current Every Day Smoker    Packs/day: 0.50    Types: Cigarettes  . Smokeless tobacco: Never Used  . Tobacco comment: quit in April 2014  Substance Use Topics  . Alcohol use: Yes    Alcohol/week: 1.0 standard drinks    Types: 1 Shots of liquor per week    Comment: occassionally   . Drug use: Yes    Types: Marijuana    Comment: Last use 06/11/18     Allergies   Latex   Review of Systems Review of Systems  Constitutional: Positive for appetite change.  HENT: Negative for congestion.   Eyes: Positive for photophobia.  Gastrointestinal: Positive for nausea and vomiting.  Genitourinary: Negative for flank pain.  Musculoskeletal: Negative for back pain.  Neurological: Positive for headaches.     Physical Exam Updated Vital Signs BP 118/84   Pulse 68   Temp 97.7 F (36.5 C) (Oral)   Resp 20   Ht 5\' 9"  (1.753 m)   Wt 104.3 kg   SpO2 98%   BMI 33.97 kg/m   Physical Exam Vitals signs and nursing note reviewed.  Constitutional:      Appearance: Normal appearance.     Comments: Patient's lights are off in the room.  HENT:     Head: Normocephalic.  Eyes:     Extraocular Movements: Extraocular movements intact.     Pupils: Pupils are equal, round, and reactive to light.  Neck:     Musculoskeletal: Neck supple.  Cardiovascular:     Rate and Rhythm: Normal rate and regular rhythm.  Pulmonary:     Effort: Pulmonary effort is normal.  Abdominal:     Tenderness: There is no abdominal tenderness.  Musculoskeletal:     Right lower leg: No edema.     Left lower leg: No edema.  Skin:    General: Skin is warm.     Capillary Refill: Capillary refill takes less than 2 seconds.  Neurological:     Mental Status: She is oriented to person, place, and time.      ED Treatments / Results  Labs (all labs ordered are  listed, but only abnormal results are displayed) Labs Reviewed  I-STAT BETA HCG  BLOOD, ED ([facility], [facility], [facility] ONLY)    EKG None  Radiology No results found.  Procedures Procedures (including critical care time)  Medications Ordered in ED Medications  sodium chloride 0.9 % bolus 1,000 mL (1,000 mLs Intravenous New Bag/Given 02/24/19 1703)  ketorolac (TORADOL) 30 MG/ML injection 30 mg (30 mg Intravenous Given 02/24/19 1704)  prochlorperazine (COMPAZINE) injection 10 mg (10 mg Intravenous Given 02/24/19 1704)  diphenhydrAMINE (BENADRYL) injection 12.5 mg (12.5 mg Intravenous Given 02/24/19 1704)     Initial Impression / Assessment and Plan / ED Course  I have reviewed the triage vital signs and the nursing notes.  Pertinent labs & imaging results that were available during my care of the patient were reviewed by me and considered in my medical decision making (see chart for details).        Patient with migraine.  History of same.  No relief at home.  Treated with migraine cocktail here.  Sleeping comfortably.  Patient now feels much better.  Will discharge home.  Neurology follow-up as needed.  Final Clinical Impressions(s) / ED Diagnoses   Final diagnoses:  Migraine with status migrainosus, not intractable, unspecified migraine type    ED Discharge Orders    None       Davonna Belling, MD 02/24/19 1737    Davonna Belling, MD 02/24/19 1759

## 2019-02-28 ENCOUNTER — Encounter: Payer: Self-pay | Admitting: Neurology

## 2019-02-28 DIAGNOSIS — M25552 Pain in left hip: Secondary | ICD-10-CM | POA: Diagnosis not present

## 2019-03-28 ENCOUNTER — Telehealth: Payer: Self-pay | Admitting: Neurology

## 2019-03-28 NOTE — Telephone Encounter (Signed)
Patient sent this message to [name] about getting appt sch with Jaffe   I need my perscriptions refilled. And maybe try switching them. Im getting more frequent migraines.    She did not say in her message which pharmacy she uses s  Is this ok for a evisit or do you need to see her in person?

## 2019-03-29 NOTE — Telephone Encounter (Signed)
Patient is sch for 03-31-19 for E Visit with Tomi Likens

## 2019-03-29 NOTE — Telephone Encounter (Signed)
E-visit is fine

## 2019-03-31 ENCOUNTER — Telehealth (INDEPENDENT_AMBULATORY_CARE_PROVIDER_SITE_OTHER): Payer: Medicaid Other | Admitting: Neurology

## 2019-03-31 ENCOUNTER — Encounter: Payer: Self-pay | Admitting: Neurology

## 2019-03-31 ENCOUNTER — Other Ambulatory Visit: Payer: Self-pay

## 2019-03-31 VITALS — Ht 69.0 in | Wt 230.0 lb

## 2019-03-31 DIAGNOSIS — G43009 Migraine without aura, not intractable, without status migrainosus: Secondary | ICD-10-CM

## 2019-03-31 MED ORDER — TOPIRAMATE 100 MG PO TABS: 100.0000 mg | ORAL_TABLET | Freq: Every day | ORAL | 3 refills | Status: DC

## 2019-03-31 MED ORDER — ONDANSETRON 4 MG PO TBDP: 4.0000 mg | ORAL_TABLET | Freq: Three times a day (TID) | ORAL | 3 refills | Status: DC | PRN

## 2019-03-31 MED ORDER — SUMATRIPTAN SUCCINATE 100 MG PO TABS: ORAL_TABLET | ORAL | 3 refills | Status: DC
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
## 2019-04-20 DIAGNOSIS — Z20828 Contact with and (suspected) exposure to other viral communicable diseases: Secondary | ICD-10-CM | POA: Diagnosis not present

## 2019-05-07 ENCOUNTER — Emergency Department (HOSPITAL_COMMUNITY)
Admission: EM | Admit: 2019-05-07 | Discharge: 2019-05-07 | Disposition: A | Discharge status: Home / Self Care | Payer: Medicaid Other | Attending: Emergency Medicine | Admitting: Emergency Medicine

## 2019-05-07 ENCOUNTER — Emergency Department (HOSPITAL_COMMUNITY): Payer: Medicaid Other

## 2019-05-07 ENCOUNTER — Encounter (HOSPITAL_COMMUNITY): Payer: Self-pay | Admitting: *Deleted

## 2019-05-07 ENCOUNTER — Other Ambulatory Visit: Payer: Self-pay

## 2019-05-07 DIAGNOSIS — S30810A Abrasion of lower back and pelvis, initial encounter: Secondary | ICD-10-CM | POA: Diagnosis not present

## 2019-05-07 DIAGNOSIS — S8001XA Contusion of right knee, initial encounter: Secondary | ICD-10-CM | POA: Diagnosis not present

## 2019-05-07 DIAGNOSIS — S8991XA Unspecified injury of right lower leg, initial encounter: Secondary | ICD-10-CM | POA: Diagnosis present

## 2019-05-07 DIAGNOSIS — Y998 Other external cause status: Secondary | ICD-10-CM | POA: Insufficient documentation

## 2019-05-07 DIAGNOSIS — S0003XA Contusion of scalp, initial encounter: Secondary | ICD-10-CM | POA: Diagnosis not present

## 2019-05-07 DIAGNOSIS — Z91040 Latex allergy status: Secondary | ICD-10-CM | POA: Diagnosis not present

## 2019-05-07 DIAGNOSIS — S9001XA Contusion of right ankle, initial encounter: Secondary | ICD-10-CM | POA: Diagnosis not present

## 2019-05-07 DIAGNOSIS — Z79899 Other long term (current) drug therapy: Secondary | ICD-10-CM | POA: Insufficient documentation

## 2019-05-07 DIAGNOSIS — Y92009 Unspecified place in unspecified non-institutional (private) residence as the place of occurrence of the external cause: Secondary | ICD-10-CM | POA: Diagnosis not present

## 2019-05-07 DIAGNOSIS — S40011A Contusion of right shoulder, initial encounter: Secondary | ICD-10-CM | POA: Diagnosis not present

## 2019-05-07 DIAGNOSIS — Z23 Encounter for immunization: Secondary | ICD-10-CM | POA: Diagnosis not present

## 2019-05-07 DIAGNOSIS — S70311A Abrasion, right thigh, initial encounter: Secondary | ICD-10-CM | POA: Diagnosis not present

## 2019-05-07 DIAGNOSIS — S40012A Contusion of left shoulder, initial encounter: Secondary | ICD-10-CM | POA: Insufficient documentation

## 2019-05-07 DIAGNOSIS — S60012A Contusion of left thumb without damage to nail, initial encounter: Secondary | ICD-10-CM | POA: Diagnosis not present

## 2019-05-07 DIAGNOSIS — R52 Pain, unspecified: Secondary | ICD-10-CM

## 2019-05-07 DIAGNOSIS — S40211A Abrasion of right shoulder, initial encounter: Secondary | ICD-10-CM | POA: Diagnosis not present

## 2019-05-07 DIAGNOSIS — S9031XA Contusion of right foot, initial encounter: Secondary | ICD-10-CM | POA: Diagnosis not present

## 2019-05-07 DIAGNOSIS — S80211A Abrasion, right knee, initial encounter: Secondary | ICD-10-CM | POA: Diagnosis not present

## 2019-05-07 DIAGNOSIS — S80811A Abrasion, right lower leg, initial encounter: Secondary | ICD-10-CM | POA: Diagnosis not present

## 2019-05-07 DIAGNOSIS — S7001XA Contusion of right hip, initial encounter: Secondary | ICD-10-CM | POA: Insufficient documentation

## 2019-05-07 DIAGNOSIS — T07XXXA Unspecified multiple injuries, initial encounter: Secondary | ICD-10-CM

## 2019-05-07 DIAGNOSIS — J45909 Unspecified asthma, uncomplicated: Secondary | ICD-10-CM | POA: Insufficient documentation

## 2019-05-07 DIAGNOSIS — S40212A Abrasion of left shoulder, initial encounter: Secondary | ICD-10-CM | POA: Diagnosis not present

## 2019-05-07 DIAGNOSIS — M79645 Pain in left finger(s): Secondary | ICD-10-CM | POA: Diagnosis not present

## 2019-05-07 DIAGNOSIS — F17210 Nicotine dependence, cigarettes, uncomplicated: Secondary | ICD-10-CM | POA: Insufficient documentation

## 2019-05-07 DIAGNOSIS — Y9389 Activity, other specified: Secondary | ICD-10-CM | POA: Insufficient documentation

## 2019-05-07 DIAGNOSIS — R51 Headache: Secondary | ICD-10-CM | POA: Diagnosis not present

## 2019-05-07 LAB — CBC WITH DIFFERENTIAL/PLATELET
Abs Immature Granulocytes: 0.02 10*3/uL (ref 0.00–0.07)
Basophils Absolute: 0 10*3/uL (ref 0.0–0.1)
Basophils Relative: 0 %
Eosinophils Absolute: 0 10*3/uL (ref 0.0–0.5)
Eosinophils Relative: 0 %
HCT: 43.1 % (ref 36.0–46.0)
Hemoglobin: 14.3 g/dL (ref 12.0–15.0)
Immature Granulocytes: 0 %
Lymphocytes Relative: 22 %
Lymphs Abs: 2.1 10*3/uL (ref 0.7–4.0)
MCH: 30.7 pg (ref 26.0–34.0)
MCHC: 33.2 g/dL (ref 30.0–36.0)
MCV: 92.5 fL (ref 80.0–100.0)
Monocytes Absolute: 0.6 10*3/uL (ref 0.1–1.0)
Monocytes Relative: 6 %
Neutro Abs: 6.7 10*3/uL (ref 1.7–7.7)
Neutrophils Relative %: 72 %
Platelets: 238 10*3/uL (ref 150–400)
RBC: 4.66 MIL/uL (ref 3.87–5.11)
RDW: 12.6 % (ref 11.5–15.5)
WBC: 9.4 10*3/uL (ref 4.0–10.5)
nRBC: 0 % (ref 0.0–0.2)

## 2019-05-07 LAB — RAPID URINE DRUG SCREEN, HOSP PERFORMED
Amphetamines: NOT DETECTED
Barbiturates: NOT DETECTED
Benzodiazepines: NOT DETECTED
Cocaine: NOT DETECTED
Opiates: NOT DETECTED
Tetrahydrocannabinol: POSITIVE — AB

## 2019-05-07 LAB — COMPREHENSIVE METABOLIC PANEL
ALT: 14 U/L (ref 0–44)
AST: 21 U/L (ref 15–41)
Albumin: 4.2 g/dL (ref 3.5–5.0)
Alkaline Phosphatase: 61 U/L (ref 38–126)
Anion gap: 11 (ref 5–15)
BUN: 9 mg/dL (ref 6–20)
CO2: 19 mmol/L — ABNORMAL LOW (ref 22–32)
Calcium: 9.2 mg/dL (ref 8.9–10.3)
Chloride: 109 mmol/L (ref 98–111)
Creatinine, Ser: 0.78 mg/dL (ref 0.44–1.00)
GFR calc Af Amer: >60 mL/min (ref >60)
GFR calc non Af Amer: >60 mL/min (ref >60)
Glucose, Bld: 94 mg/dL (ref 70–99)
Potassium: 3.6 mmol/L (ref 3.5–5.1)
Sodium: 139 mmol/L (ref 135–145)
Total Bilirubin: 0.5 mg/dL (ref 0.3–1.2)
Total Protein: 7.1 g/dL (ref 6.5–8.1)

## 2019-05-07 LAB — URINALYSIS, ROUTINE W REFLEX MICROSCOPIC
Bacteria, UA: NONE SEEN
Bilirubin Urine: NEGATIVE
Glucose, UA: NEGATIVE mg/dL
Ketones, ur: 20 mg/dL — AB
Leukocytes,Ua: NEGATIVE
Nitrite: NEGATIVE
Protein, ur: 30 mg/dL — AB
Specific Gravity, Urine: 1.025 (ref 1.005–1.030)
pH: 6 (ref 5.0–8.0)

## 2019-05-07 LAB — PREGNANCY, URINE: Preg Test, Ur: NEGATIVE

## 2019-05-07 LAB — ETHANOL: Alcohol, Ethyl (B): 16 mg/dL — ABNORMAL HIGH (ref <10)
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
## 2019-05-07 MED ORDER — TETANUS-DIPHTH-ACELL PERTUSSIS 5-2.5-18.5 LF-MCG/0.5 IM SUSP
0.5000 mL | Freq: Once | INTRAMUSCULAR | Status: AC
  Administered 2019-05-07: 0.5 mL via INTRAMUSCULAR
  Filled 2019-05-07 (×2): qty 0.5
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
## 2019-05-07 NOTE — ED Notes (Signed)
Patient verbalizes understanding of discharge instructions. Opportunity for questioning and answers were provided. Armband removed by staff, pt discharged from ED.  
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
## 2019-06-16 ENCOUNTER — Emergency Department (HOSPITAL_COMMUNITY)
Admission: EM | Admit: 2019-06-16 | Discharge: 2019-06-17 | Disposition: A | Discharge status: Home / Self Care | Payer: Medicaid Other | Attending: Emergency Medicine | Admitting: Emergency Medicine

## 2019-06-16 ENCOUNTER — Other Ambulatory Visit: Payer: Self-pay

## 2019-06-16 ENCOUNTER — Encounter (HOSPITAL_COMMUNITY): Payer: Self-pay | Admitting: Emergency Medicine

## 2019-06-16 DIAGNOSIS — F1210 Cannabis abuse, uncomplicated: Secondary | ICD-10-CM | POA: Insufficient documentation

## 2019-06-16 DIAGNOSIS — R102 Pelvic and perineal pain: Secondary | ICD-10-CM | POA: Diagnosis not present

## 2019-06-16 DIAGNOSIS — J45909 Unspecified asthma, uncomplicated: Secondary | ICD-10-CM | POA: Insufficient documentation

## 2019-06-16 DIAGNOSIS — B9689 Other specified bacterial agents as the cause of diseases classified elsewhere: Secondary | ICD-10-CM | POA: Insufficient documentation

## 2019-06-16 DIAGNOSIS — N83201 Unspecified ovarian cyst, right side: Secondary | ICD-10-CM | POA: Diagnosis not present

## 2019-06-16 DIAGNOSIS — Z79899 Other long term (current) drug therapy: Secondary | ICD-10-CM | POA: Diagnosis not present

## 2019-06-16 DIAGNOSIS — F17210 Nicotine dependence, cigarettes, uncomplicated: Secondary | ICD-10-CM | POA: Diagnosis not present

## 2019-06-16 DIAGNOSIS — N83291 Other ovarian cyst, right side: Secondary | ICD-10-CM | POA: Diagnosis not present

## 2019-06-16 DIAGNOSIS — Z91040 Latex allergy status: Secondary | ICD-10-CM | POA: Diagnosis not present

## 2019-06-16 DIAGNOSIS — N760 Acute vaginitis: Secondary | ICD-10-CM | POA: Insufficient documentation

## 2019-06-16 LAB — CBC
HCT: 40.5 % (ref 36.0–46.0)
Hemoglobin: 13.2 g/dL (ref 12.0–15.0)
MCH: 31 pg (ref 26.0–34.0)
MCHC: 32.6 g/dL (ref 30.0–36.0)
MCV: 95.1 fL (ref 80.0–100.0)
Platelets: 234 10*3/uL (ref 150–400)
RBC: 4.26 MIL/uL (ref 3.87–5.11)
RDW: 12.6 % (ref 11.5–15.5)
WBC: 7.9 10*3/uL (ref 4.0–10.5)
nRBC: 0 % (ref 0.0–0.2)

## 2019-06-16 LAB — URINALYSIS, ROUTINE W REFLEX MICROSCOPIC
Bilirubin Urine: NEGATIVE
Glucose, UA: NEGATIVE mg/dL
Hgb urine dipstick: NEGATIVE
Ketones, ur: NEGATIVE mg/dL
Leukocytes,Ua: NEGATIVE
Nitrite: NEGATIVE
Protein, ur: NEGATIVE mg/dL
Specific Gravity, Urine: 1.008 (ref 1.005–1.030)
pH: 7 (ref 5.0–8.0)

## 2019-06-16 LAB — COMPREHENSIVE METABOLIC PANEL
ALT: 16 U/L (ref 0–44)
AST: 19 U/L (ref 15–41)
Albumin: 3.9 g/dL (ref 3.5–5.0)
Alkaline Phosphatase: 58 U/L (ref 38–126)
Anion gap: 9 (ref 5–15)
BUN: 6 mg/dL (ref 6–20)
CO2: 25 mmol/L (ref 22–32)
Calcium: 9.7 mg/dL (ref 8.9–10.3)
Chloride: 106 mmol/L (ref 98–111)
Creatinine, Ser: 0.76 mg/dL (ref 0.44–1.00)
GFR calc Af Amer: >60 mL/min (ref >60)
GFR calc non Af Amer: >60 mL/min (ref >60)
Glucose, Bld: 93 mg/dL (ref 70–99)
Potassium: 3.8 mmol/L (ref 3.5–5.1)
Sodium: 140 mmol/L (ref 135–145)
Total Bilirubin: 0.7 mg/dL (ref 0.3–1.2)
Total Protein: 6.8 g/dL (ref 6.5–8.1)

## 2019-06-16 LAB — I-STAT BETA HCG BLOOD, ED (MC, WL, AP ONLY): I-stat hCG, quantitative: <5 m[IU]/mL (ref <5)

## 2019-06-16 LAB — LIPASE, BLOOD: Lipase: 22 U/L (ref 11–51)

## 2019-06-16 MED ORDER — SODIUM CHLORIDE 0.9% FLUSH: 3.0000 mL | Freq: Once | INTRAVENOUS | Status: DC

## 2019-06-16 NOTE — ED Triage Notes (Signed)
Patient with abdominal pain, she states that it started last night, she states that it feels like she has had another Csection.  Last surgery was 6 years ago.  She has pelvic pressure.  No bleeding or discharge.  No vomiting but she does have nausea from the pain.

## 2019-06-17 ENCOUNTER — Emergency Department (HOSPITAL_COMMUNITY): Payer: Medicaid Other

## 2019-06-17 DIAGNOSIS — N83291 Other ovarian cyst, right side: Secondary | ICD-10-CM | POA: Diagnosis not present

## 2019-06-17 LAB — WET PREP, GENITAL
Sperm: NONE SEEN
Trich, Wet Prep: NONE SEEN
Yeast Wet Prep HPF POC: NONE SEEN
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
## 2019-06-17 MED ORDER — METRONIDAZOLE 500 MG PO TABS: 500.0000 mg | ORAL_TABLET | Freq: Two times a day (BID) | ORAL | 0 refills | Status: DC

## 2019-06-17 MED ORDER — HYDROCODONE-ACETAMINOPHEN 5-325 MG PO TABS
1.0000 | ORAL_TABLET | Freq: Once | ORAL | Status: AC
  Administered 2019-06-17: 05:00:00 1 via ORAL
  Filled 2019-06-17: qty 1

## 2019-06-17 MED ORDER — HYDROCODONE-ACETAMINOPHEN 5-325 MG PO TABS: 1.0000 | ORAL_TABLET | ORAL | 0 refills | Status: DC | PRN

## 2019-06-17 NOTE — ED Notes (Signed)
Patient transported to Ultrasound 

## 2019-06-17 NOTE — Discharge Instructions (Signed)
Take the prescribed medication as directed. Do not drink alcohol while taking flagyl, it will make you sick. [facility]-- call for appt in the next few weeks for re-check. Return to the ED for new or worsening symptoms.

## 2019-06-17 NOTE — ED Provider Notes (Signed)
[facility] EMERGENCY DEPARTMENT Provider Note   CSN: [id number] Arrival date & time: 06/16/19  1922     History   Chief Complaint Chief Complaint  Patient presents with   Abdominal Pain    HPI Sary Bogie is a 28 y.o. female.     The history is provided by the patient and medical records.  Abdominal Pain   28 year old female with history of anemia, asthma, seasonal allergies, migraines, presenting to the ED for pelvic pain.  She reports she has had Mirena IUD in place for about 3 years, almost due for this to be changed.  This was placed after her last child, but has not had any regular follow-up to check on this in the past few years.  She reports whenever she moves around she has a lot of pain in the pelvis.  Feels like there  pressure there, especially when urinating.  She denies dysuria, hematuria, vaginal bleeding, or vaginal discharge.  No new sexual partner or concern for STD.  States since arriving in waiting room, pain does seem worse on her right side and some occasional shooting pains into right flank.  No hx of kidney stones.  No meds PTA.  Past Medical History:  Diagnosis Date   Anemia    Asthma    hx chronic bronchitis/exercise induced asthma as pre teen only   Environmental allergies    Infection    UTI   Migraine    Seasonal allergies    Wrist fracture, right     Patient Active Problem List   Diagnosis Date Noted   Acute pain of left knee 05/24/2017   Pain in right wrist 05/24/2017   Mirena intrauterine device in place 03/02/2016    Past Surgical History:  Procedure Laterality Date   CESAREAN SECTION  2008   CESAREAN SECTION N/A 06/15/2013   Procedure: CESAREAN SECTION;  Surgeon: Allyn Kenner, DO;  Location: [facility];  Service: Obstetrics;  Laterality: N/A;   DILATION AND CURETTAGE OF UTERUS  08/2011   FOOT SURGERY  2017   INDUCED ABORTION       OB History    Gravida  5   Para  2   Term  2   Preterm     AB  3   Living  2     SAB      TAB  2   Ectopic      Multiple      Live Births  1            Home Medications    Prior to Admission medications   Medication Sig Start Date End Date Taking? Authorizing Provider  albuterol (PROVENTIL HFA;VENTOLIN HFA) 108 (90 Base) MCG/ACT inhaler Inhale 1-2 puffs into the lungs every 6 (six) hours as needed for wheezing or shortness of breath. 12/12/18   Wardell Honour, MD  aspirin-acetaminophen-caffeine (EXCEDRIN EXTRA STRENGTH) [phone] MG tablet Take 1-2 tablets by mouth every 6 (six) hours as needed for headache or migraine.    [provider]  cyclobenzaprine (FLEXERIL) 10 MG tablet Take 1 tablet (10 mg total) by mouth 3 (three) times daily as needed for muscle spasms. Patient not taking: Reported on 05/07/2019 12/04/18   Orpah Greek, MD  hydrocortisone (ANUSOL-HC) 2.5 % rectal cream Apply rectally 2 times daily Patient not taking: Reported on 05/07/2019 06/30/18   Loura Halt A, NP  ibuprofen (ADVIL,MOTRIN) 800 MG tablet Take 1 tablet (800 mg total) by mouth every 6 (six)  hours as needed for moderate pain. Patient not taking: Reported on 05/07/2019 12/04/18   Orpah Greek, MD  Levonorgestrel (MIRENA, 52 MG, IU) 1 each by Intrauterine route once.     [provider]  naproxen (NAPROSYN) 500 MG tablet Take 1 tablet (500 mg total) by mouth every 12 (twelve) hours as needed. Patient not taking: Reported on 05/07/2019 07/12/18   Pieter Partridge, DO  ondansetron (ZOFRAN ODT) 4 MG disintegrating tablet Take 1 tablet (4 mg total) by mouth every 8 (eight) hours as needed for nausea or vomiting. 03/31/19   Tomi Likens, Adam R, DO  SUMAtriptan (IMITREX) 100 MG tablet TAKE 1 TABLET AT EARLIEST ONSET OF MIGRAINE. MAY REPEAT ONCE IN 2 HOURS IF HEADACHE PERSISTS OR RECURS. NO MORE THAN 2 TABLETS PER 24 HOURS Patient taking differently: Take 100 mg by mouth See admin instructions. Take 100 mg by mouth at earliest onset of  migraine- may repeat once in 2 hours if headache persists or recurs (Max of 2 tablets/24 hours) 03/31/19   Tomi Likens, Adam R, DO  topiramate (TOPAMAX) 100 MG tablet Take 1 tablet (100 mg total) by mouth at bedtime. 03/31/19   Pieter Partridge, DO    Family History Family History  Problem Relation Age of Onset   Cancer Father        liver   Diabetes Father    Hypertension Father    Hypertension Mother    Hypertension Maternal Grandmother    Kidney disease Maternal Grandmother    Diabetes Paternal Grandmother    Hypertension Paternal Grandmother    Ulcers Brother    Crohn's disease Brother     Social History Social History   Tobacco Use   Smoking status: Current Every Day Smoker    Packs/day: 0.50    Types: Cigarettes   Smokeless tobacco: Never Used   Tobacco comment: quit in April 2014  Substance Use Topics   Alcohol use: Yes    Alcohol/week: 1.0 standard drinks    Types: 1 Shots of liquor per week    Comment: occassionally    Drug use: Yes    Types: Marijuana    Comment: Last use 06/11/18     Allergies   [name] [camphor-menthol] and Latex   Review of Systems Review of Systems  Gastrointestinal: Positive for abdominal pain.  Genitourinary: Positive for pelvic pain.  All other systems reviewed and are negative.    Physical Exam Updated Vital Signs BP (!) 96/58 (BP Location: Right Arm)    Pulse 63    Temp 98 F (36.7 C) (Oral)    Resp 19    SpO2 100%   Physical Exam Vitals signs and nursing note reviewed.  Constitutional:      Appearance: She is well-developed.  HENT:     Head: Normocephalic and atraumatic.  Eyes:     Conjunctiva/sclera: Conjunctivae normal.     Pupils: Pupils are equal, round, and reactive to light.  Neck:     Musculoskeletal: Normal range of motion.  Cardiovascular:     Rate and Rhythm: Normal rate and regular rhythm.     Heart sounds: Normal heart sounds.  Pulmonary:     Effort: Pulmonary effort is normal.     Breath  sounds: Normal breath sounds.  Abdominal:     General: Bowel sounds are normal.     Palpations: Abdomen is soft.     Tenderness: There is no abdominal tenderness.  Genitourinary:    Comments: Exam chaperoned by NT Normal  female external genitalia without visible lesions or rash; moderate amount of thin/white vaginal discharge present, IUD strings are not visible even when repositioning speculum several times; right adnexa is TTP, left non-tender, no CMT Musculoskeletal: Normal range of motion.  Skin:    General: Skin is warm and dry.  Neurological:     Mental Status: She is alert and oriented to person, place, and time.      ED Treatments / Results  Labs (all labs ordered are listed, but only abnormal results are displayed) Labs Reviewed  WET PREP, GENITAL - Abnormal; Notable for the following components:      Result Value   Clue Cells Wet Prep HPF POC PRESENT (*)    WBC, Wet Prep HPF POC MODERATE (*)    All other components within normal limits  URINALYSIS, ROUTINE W REFLEX MICROSCOPIC - Abnormal; Notable for the following components:   APPearance HAZY (*)    All other components within normal limits  LIPASE, BLOOD  COMPREHENSIVE METABOLIC PANEL  CBC  I-STAT BETA HCG BLOOD, ED ([facility], [facility], [facility] ONLY)  GC/CHLAMYDIA PROBE AMP ([place]) NOT AT [facility]    EKG None  Radiology US Pelvic Doppler (torsion R/o Or Mass Arterial Flow)  Result Date: 06/17/2019 CLINICAL DATA:  28 year old female with history of pelvic pressure today. Unable to locate IUD on pelvic examination. EXAM: TRANSABDOMINAL AND TRANSVAGINAL ULTRASOUND OF PELVIS DOPPLER ULTRASOUND OF OVARIES TECHNIQUE: Both transabdominal and transvaginal ultrasound examinations of the pelvis were performed. Transabdominal technique was performed for global imaging of the pelvis including uterus, ovaries, adnexal regions, and pelvic cul-de-sac. It was necessary to proceed with endovaginal exam following the transabdominal exam to  visualize the adnexal regions. Color and duplex Doppler ultrasound was utilized to evaluate blood flow to the ovaries. COMPARISON:  No priors. FINDINGS: Uterus Measurements: 8.6 x 4.0 x 5.0 cm = volume: 89.1 mL. No fibroids or other mass visualized. Endometrium IUD present in the endometrial canal. Right ovary Measurements: 4.1 x 3.2 x 4.5 cm = volume: 30.2 mL. Within the right ovary there is a 2.4 x 2.1 x 1.8 cm hypoechoic lesion with increased through transmission with multiple thick (up to 5 mm) internal septations with some internal blood flow associated with the septations, and low to intermediate level internal echogenicity, compatible with a complex cyst. Left ovary Measurements: 3.3 x 1.9 x 2.7 cm = volume: 8.3 mL. Normal appearance/no adnexal mass. Pulsed Doppler evaluation of both ovaries demonstrates normal low-resistance arterial and venous waveforms. Other findings Trace volume of free fluid in the cul-de-sac, presumably physiologic in this young female patient. IMPRESSION: 1. IUD noted in the endometrial canal. 2. 2.4 x 2.1 x 1.8 cm complex cyst in the right ovary. Nonemergent outpatient referral to [facility] for further evaluation and follow-up is recommended. This recommendation follows the consensus statement: Management of Asymptomatic Ovarian and Other Adnexal Cysts Imaged at [name]: Society of Radiologists in [place]. Radiology 2010; [phone]. Electronically Signed   By: [name] M.D.   On: 06/17/2019 04:35   US Pelvic Complete With Transvaginal  Result Date: 06/17/2019 CLINICAL DATA:  28 year old female with history of pelvic pressure today. Unable to locate IUD on pelvic examination. EXAM: TRANSABDOMINAL AND TRANSVAGINAL ULTRASOUND OF PELVIS DOPPLER ULTRASOUND OF OVARIES TECHNIQUE: Both transabdominal and transvaginal ultrasound examinations of the pelvis were performed. Transabdominal technique was performed for global imaging of the pelvis including  uterus, ovaries, adnexal regions, and pelvic cul-de-sac. It was necessary to proceed with endovaginal exam following  the transabdominal exam to visualize the adnexal regions. Color and duplex Doppler ultrasound was utilized to evaluate blood flow to the ovaries. COMPARISON:  No priors. FINDINGS: Uterus Measurements: 8.6 x 4.0 x 5.0 cm = volume: 89.1 mL. No fibroids or other mass visualized. Endometrium IUD present in the endometrial canal. Right ovary Measurements: 4.1 x 3.2 x 4.5 cm = volume: 30.2 mL. Within the right ovary there is a 2.4 x 2.1 x 1.8 cm hypoechoic lesion with increased through transmission with multiple thick (up to 5 mm) internal septations with some internal blood flow associated with the septations, and low to intermediate level internal echogenicity, compatible with a complex cyst. Left ovary Measurements: 3.3 x 1.9 x 2.7 cm = volume: 8.3 mL. Normal appearance/no adnexal mass. Pulsed Doppler evaluation of both ovaries demonstrates normal low-resistance arterial and venous waveforms. Other findings Trace volume of free fluid in the cul-de-sac, presumably physiologic in this young female patient. IMPRESSION: 1. IUD noted in the endometrial canal. 2. 2.4 x 2.1 x 1.8 cm complex cyst in the right ovary. Nonemergent outpatient referral to [facility] for further evaluation and follow-up is recommended. This recommendation follows the consensus statement: Management of Asymptomatic Ovarian and Other Adnexal Cysts Imaged at [name]: Society of Radiologists in [place]. Radiology 2010; [phone]. Electronically Signed   By: [name] M.D.   On: 06/17/2019 04:35    Procedures Procedures (including critical care time)  Medications Ordered in ED Medications  sodium chloride flush (NS) 0.9 % injection 3 mL (3 mLs Intravenous Not Given 06/17/19 0207)  HYDROcodone-acetaminophen (NORCO/VICODIN) 5-325 MG per tablet 1 tablet (has no administration in time range)      Initial Impression / Assessment and Plan / ED Course  I have reviewed the triage vital signs and the nursing notes.  Pertinent labs & imaging results that were available during my care of the patient were reviewed by me and considered in my medical decision making (see chart for details).  28 year old female here with pelvic pain.  Has IUD in place but has not had this checked in several years.  She feels like something may be malpositioned.  She denies any irregular bleeding, discharge, urinary symptoms.  Has started having some shooting pains on right side since arriving in ED.  She is afebrile and nontoxic.  Pelvic exam does reveal some thin, white vaginal discharge, no abnormal bleeding.  I am unable to visualize her IUD strings on exam.  She does have some tenderness of the right adnexa but no cervical motion tenderness.  Wet prep with clue cells. Gc/chl pending.  Exam not truly concerning for PID.  Ultrasound confirms positioning of IUD, she also has complex right ovarian cyst.  No findings concerning for torsion. This is likely source of her pain.  She will be started on a course of Flagyl and given pain medication.  She will need close follow-up with OB/GYN.  Return here for any new or acute changes.  Final Clinical Impressions(s) / ED Diagnoses   Final diagnoses:  Pelvic pain  Bacterial vaginosis  Cyst of right ovary    ED Discharge Orders    None       Larene Pickett, PA-C 06/17/19 [place], [place], DO 06/17/19 [phone]

## 2019-06-20 LAB — GC/CHLAMYDIA PROBE AMP (~~LOC~~) NOT AT ARMC
Chlamydia: NEGATIVE
Neisseria Gonorrhea: NEGATIVE

## 2019-07-12 ENCOUNTER — Ambulatory Visit (HOSPITAL_COMMUNITY)
Admission: EM | Admit: 2019-07-12 | Discharge: 2019-07-12 | Disposition: A | Discharge status: Home / Self Care | Payer: Medicaid Other | Attending: Family Medicine | Admitting: Family Medicine

## 2019-07-12 ENCOUNTER — Other Ambulatory Visit: Payer: Self-pay

## 2019-07-12 ENCOUNTER — Encounter (HOSPITAL_COMMUNITY): Payer: Self-pay

## 2019-07-12 DIAGNOSIS — R0989 Other specified symptoms and signs involving the circulatory and respiratory systems: Secondary | ICD-10-CM | POA: Diagnosis not present

## 2019-07-12 MED ORDER — ESOMEPRAZOLE MAGNESIUM 40 MG PO CPDR: 40.0000 mg | DELAYED_RELEASE_CAPSULE | Freq: Every day | ORAL | 0 refills | Status: DC

## 2019-07-12 NOTE — ED Provider Notes (Signed)
[place]   [id number] 07/12/19 Arrival Time: Why PLAN:  1. Globus pharyngeus     Discussed possible causes. No changes in PO intake.  Trial of: Meds ordered this encounter  Medications  . esomeprazole (NEXIUM) 40 MG capsule    Sig: Take 1 capsule (40 mg total) by mouth daily.    Dispense:  20 capsule    Refill:  0   Recommend: Follow-up Information    Schedule an appointment as soon as possible for a visit  with [facility].   Contact information: [address] [place] [address] [phone]           Reviewed expectations re: course of current medical issues. Questions answered. Outlined signs and symptoms indicating need for more acute intervention. Patient verbalized understanding. After Visit Summary given.   SUBJECTIVE:  Michele Avila is a 28 y.o. female who reports "feeling like there's something stuck in my throat, like a hair or something". Awoke one morning one week ago with sensation. No better or worse now. Usually feels all day. Cannot say whether she feels this when she is eating. No painful swallowing. Normal PO intake without n/v. No respiratory symptoms. Afebrile. Feels well otherwise. No specific aggravating or alleviating factors reported. No OTC treatment.  ROS: As per HPI.   OBJECTIVE:  Vitals:   07/12/19 1145  BP: (!) 118/58  Pulse: 80  Resp: 16  Temp: 98.8 F (37.1 C)  TempSrc: Oral  SpO2: 97%     General appearance: alert; no distress HEENT: throat without tonsillar hypertrophy, erythema; tongue appears normal; uvula midline Neck: supple with FROM; no lymphadenopathy CV: RRR Lungs: clear to auscultation bilaterally Abd: soft; non-tender Skin: reveals no rash; warm and dry Psychological: alert and cooperative; normal mood and affect  Allergies  Allergen Reactions  . [name] [Camphor-Menthol] Swelling    SEVERE FACIAL SWELLING  . Yellow Dyes  (Non-Tartrazine)     migraines  . Latex Hives and Rash    Past Medical History:  Diagnosis Date  . Anemia   . Asthma    hx chronic bronchitis/exercise induced asthma as pre teen only  . Environmental allergies   . Infection    UTI  . Migraine   . Seasonal allergies   . Wrist fracture, right    Social History   Socioeconomic History  . Marital status: Single    Spouse name: Not on file  . Number of children: 1  . Years of education: Not on file  . Highest education level: Some college, no degree  Occupational History    Employer: Materials engineer: [employer]  Social Needs  . Financial resource strain: Not on file  . Food insecurity    Worry: Not on file    Inability: Not on file  . Transportation needs    Medical: Not on file    Non-medical: Not on file  Tobacco Use  . Smoking status: Current Every Day Smoker    Packs/day: 0.50    Types: Cigarettes  . Smokeless tobacco: Never Used  . Tobacco comment: quit in April 2014  Substance and Sexual Activity  . Alcohol use: Not Currently    Comment: occassionally   . Drug use: Yes    Types: Marijuana    Comment: Last use 06/11/18  . Sexual activity: Yes    Birth control/protection: I.U.D.  Lifestyle  . Physical activity    Days per  week: Not on file    Minutes per session: Not on file  . Stress: Not on file  Relationships  . Social Herbalist on phone: Not on file    Gets together: Not on file    Attends religious service: Not on file    Active member of club or organization: Not on file    Attends meetings of clubs or organizations: Not on file    Relationship status: Not on file  . Intimate partner violence    Fear of current or ex partner: Not on file    Emotionally abused: Not on file    Physically abused: Not on file    Forced sexual activity: Not on file  Other Topics Concern  . Not on file  Social History Narrative   Patient is right-handed. She lives with her boyfriend and son in a 2  story house. She drinks 1-3 sodas, and 1-3 32oz cups of tea a day. She is acitive at work, loading and unloading trucks.   Family History  Problem Relation Age of Onset  . Cancer Father        liver  . Diabetes Father   . Hypertension Father   . Hypertension Mother   . Hypertension Maternal Grandmother   . Kidney disease Maternal Grandmother   . Diabetes Paternal Grandmother   . Hypertension Paternal Grandmother   . Ulcers Brother   . Crohn's disease Brother           Vanessa Kick, MD 07/12/19 1247

## 2019-07-12 NOTE — ED Triage Notes (Signed)
Pt presents to UC with feeling of "hair in throat" for 2 weeks. Pt reports vomiting d/t gagging. Has not impeded food or water intake.

## 2019-07-18 DIAGNOSIS — T189XXA Foreign body of alimentary tract, part unspecified, initial encounter: Secondary | ICD-10-CM | POA: Diagnosis not present

## 2019-07-18 DIAGNOSIS — R0989 Other specified symptoms and signs involving the circulatory and respiratory systems: Secondary | ICD-10-CM | POA: Diagnosis not present

## 2019-07-18 DIAGNOSIS — K219 Gastro-esophageal reflux disease without esophagitis: Secondary | ICD-10-CM | POA: Diagnosis not present
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
## 2019-08-01 ENCOUNTER — Ambulatory Visit (INDEPENDENT_AMBULATORY_CARE_PROVIDER_SITE_OTHER): Payer: Medicaid Other | Admitting: Neurology

## 2019-08-01 ENCOUNTER — Other Ambulatory Visit: Payer: Self-pay

## 2019-08-01 ENCOUNTER — Encounter: Payer: Self-pay | Admitting: Neurology

## 2019-08-01 VITALS — BP 133/88 | HR 77 | Temp 98.2°F | Ht 69.0 in | Wt 222.0 lb

## 2019-08-01 DIAGNOSIS — G43009 Migraine without aura, not intractable, without status migrainosus: Secondary | ICD-10-CM

## 2019-08-01 MED ORDER — TIZANIDINE HCL 4 MG PO TABS: 4.0000 mg | ORAL_TABLET | Freq: Four times a day (QID) | ORAL | 3 refills | Status: DC | PRN

## 2019-08-01 NOTE — Patient Instructions (Signed)
1.  Continue topiramate 2.  When you get a migraine, take sumatriptan.  You may also take tizanidine 4mg  to help reduce neck stiffness.  Caution for drowsiness. 3.  Follow up in 6 months.

## 2019-09-16 ENCOUNTER — Emergency Department (HOSPITAL_COMMUNITY): Payer: Medicaid Other

## 2019-09-16 ENCOUNTER — Other Ambulatory Visit: Payer: Self-pay

## 2019-09-16 ENCOUNTER — Encounter (HOSPITAL_COMMUNITY): Payer: Self-pay | Admitting: Emergency Medicine

## 2019-09-16 ENCOUNTER — Emergency Department (HOSPITAL_COMMUNITY)
Admission: EM | Admit: 2019-09-16 | Discharge: 2019-09-16 | Disposition: A | Discharge status: Home / Self Care | Payer: Medicaid Other | Attending: Emergency Medicine | Admitting: Emergency Medicine

## 2019-09-16 DIAGNOSIS — R1013 Epigastric pain: Secondary | ICD-10-CM | POA: Diagnosis not present

## 2019-09-16 DIAGNOSIS — R109 Unspecified abdominal pain: Secondary | ICD-10-CM | POA: Diagnosis not present

## 2019-09-16 DIAGNOSIS — R1011 Right upper quadrant pain: Secondary | ICD-10-CM | POA: Insufficient documentation

## 2019-09-16 DIAGNOSIS — F17210 Nicotine dependence, cigarettes, uncomplicated: Secondary | ICD-10-CM | POA: Diagnosis not present

## 2019-09-16 DIAGNOSIS — R197 Diarrhea, unspecified: Secondary | ICD-10-CM | POA: Diagnosis not present

## 2019-09-16 DIAGNOSIS — R10811 Right upper quadrant abdominal tenderness: Secondary | ICD-10-CM

## 2019-09-16 DIAGNOSIS — J45909 Unspecified asthma, uncomplicated: Secondary | ICD-10-CM | POA: Diagnosis not present

## 2019-09-16 DIAGNOSIS — A09 Infectious gastroenteritis and colitis, unspecified: Secondary | ICD-10-CM | POA: Diagnosis not present

## 2019-09-16 LAB — COMPREHENSIVE METABOLIC PANEL
ALT: 18 U/L (ref 0–44)
AST: 19 U/L (ref 15–41)
Albumin: 4.1 g/dL (ref 3.5–5.0)
Alkaline Phosphatase: 56 U/L (ref 38–126)
Anion gap: 9 (ref 5–15)
BUN: 10 mg/dL (ref 6–20)
CO2: 24 mmol/L (ref 22–32)
Calcium: 9.4 mg/dL (ref 8.9–10.3)
Chloride: 106 mmol/L (ref 98–111)
Creatinine, Ser: 0.68 mg/dL (ref 0.44–1.00)
GFR calc Af Amer: >60 mL/min (ref >60)
GFR calc non Af Amer: >60 mL/min (ref >60)
Glucose, Bld: 102 mg/dL — ABNORMAL HIGH (ref 70–99)
Potassium: 3.5 mmol/L (ref 3.5–5.1)
Sodium: 139 mmol/L (ref 135–145)
Total Bilirubin: 0.6 mg/dL (ref 0.3–1.2)
Total Protein: 7 g/dL (ref 6.5–8.1)

## 2019-09-16 LAB — I-STAT BETA HCG BLOOD, ED (MC, WL, AP ONLY): I-stat hCG, quantitative: <5 m[IU]/mL (ref <5)

## 2019-09-16 LAB — URINALYSIS, ROUTINE W REFLEX MICROSCOPIC
Bilirubin Urine: NEGATIVE
Glucose, UA: NEGATIVE mg/dL
Ketones, ur: 5 mg/dL — AB
Nitrite: NEGATIVE
Protein, ur: NEGATIVE mg/dL
Specific Gravity, Urine: 1.025 (ref 1.005–1.030)
pH: 6 (ref 5.0–8.0)

## 2019-09-16 LAB — CBC
HCT: 41.5 % (ref 36.0–46.0)
Hemoglobin: 13.8 g/dL (ref 12.0–15.0)
MCH: 31.2 pg (ref 26.0–34.0)
MCHC: 33.3 g/dL (ref 30.0–36.0)
MCV: 93.7 fL (ref 80.0–100.0)
Platelets: 261 10*3/uL (ref 150–400)
RBC: 4.43 MIL/uL (ref 3.87–5.11)
RDW: 11.9 % (ref 11.5–15.5)
WBC: 7.5 10*3/uL (ref 4.0–10.5)
nRBC: 0 % (ref 0.0–0.2)

## 2019-09-16 LAB — LIPASE, BLOOD: Lipase: 28 U/L (ref 11–51)

## 2019-09-16 IMAGING — CT CT ABD-PELV W/ CM
2 of 4 series · 16 of 46 positions shown, 18 images · IV contrast (omnipaque)
Comparison: None.

CLINICAL DATA: Abdominal pain, right-sided cramping and diarrhea

EXAM:
CT ABDOMEN AND PELVIS WITH CONTRAST
TECHNIQUE: Multidetector CT imaging of the abdomen and pelvis was performed
using the standard protocol following bolus administration of
intravenous contrast.
CONTRAST:  100mL OMNIPAQUE IOHEXOL 300 MG/ML  SOLN

[Series 3: abd/ pelvis 5.0 i30f 2 · axial · 0.98mm/px · z∈[+720,+1185]mm · 13 of 103 slices shown, 15 images]
[im 5/103  soft-tissue]
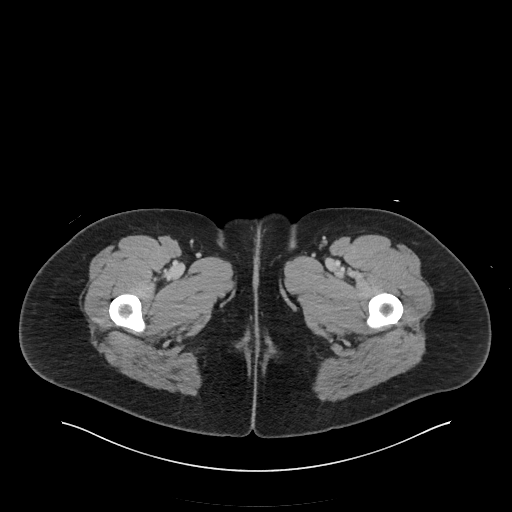
[im 5/103  bone]
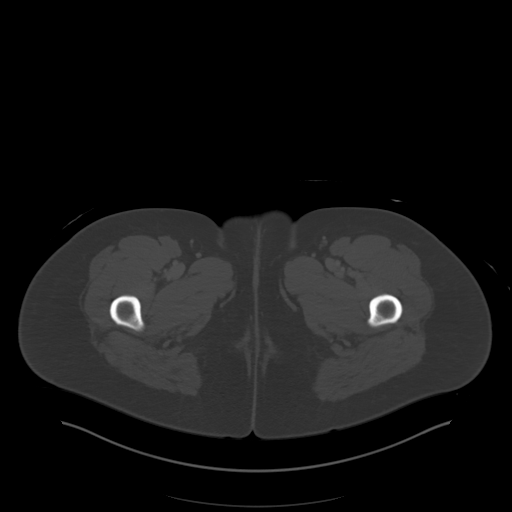
[im 13/103  soft-tissue]
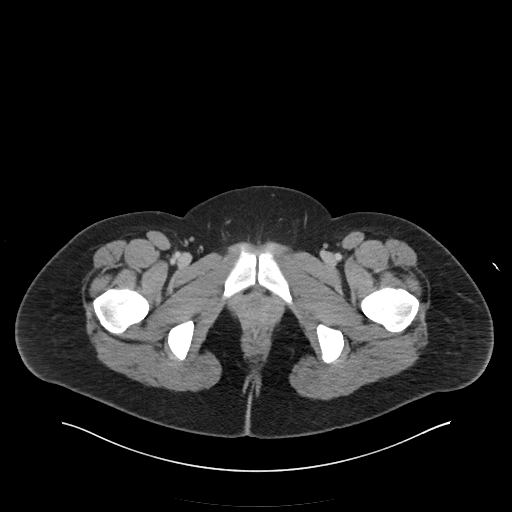
[im 22/103  soft-tissue]
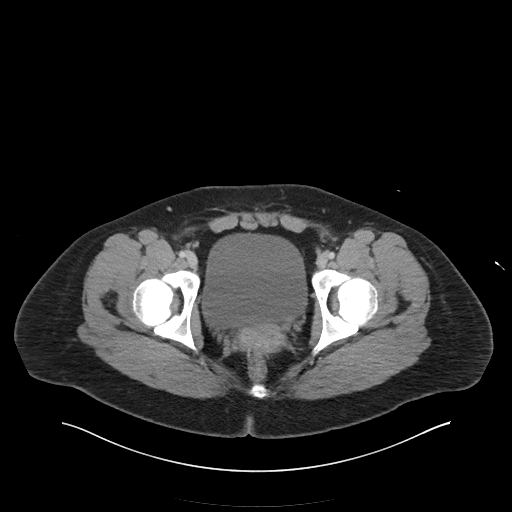
[im 30/103  soft-tissue]
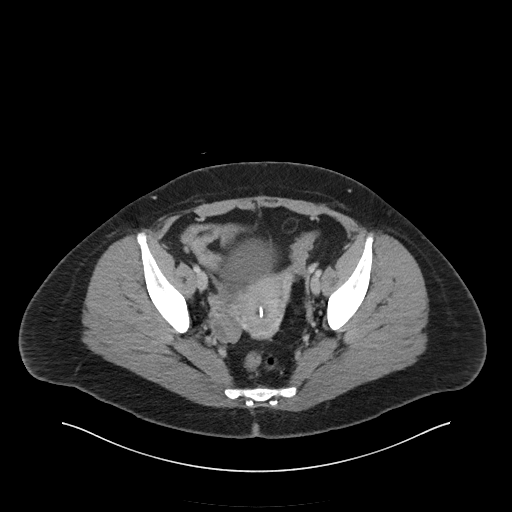
[im 35/103  soft-tissue]
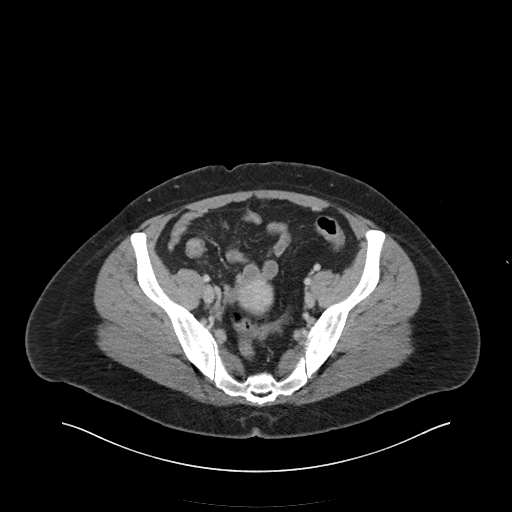
[im 43/103  soft-tissue]
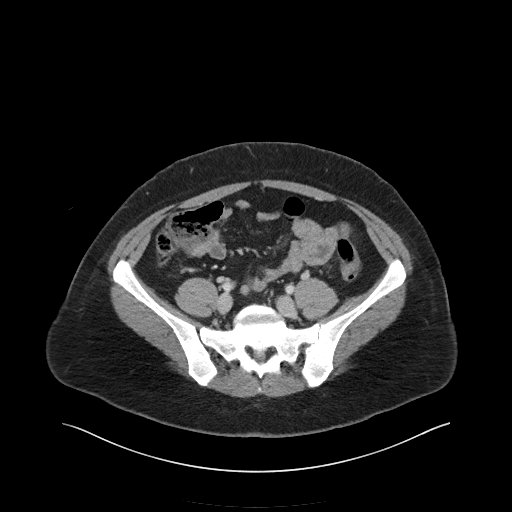
[im 52/103  soft-tissue]
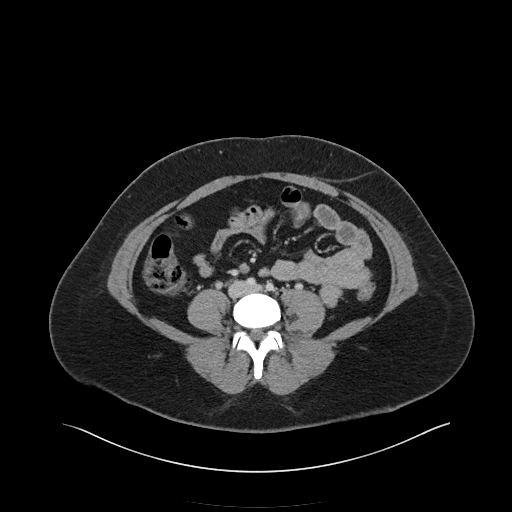
[im 60/103  soft-tissue]
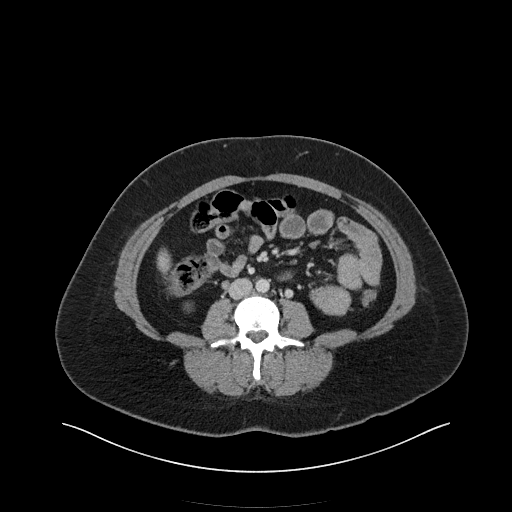
[im 69/103  soft-tissue]
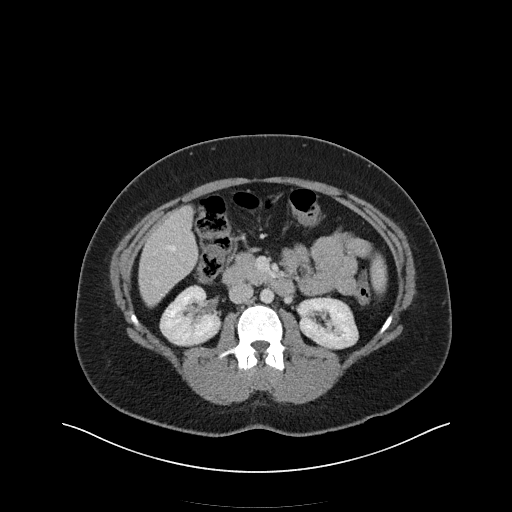
[im 69/103  bone]
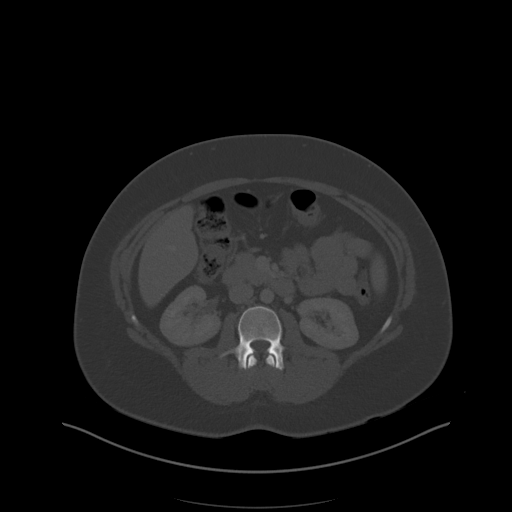
[im 73/103  soft-tissue]
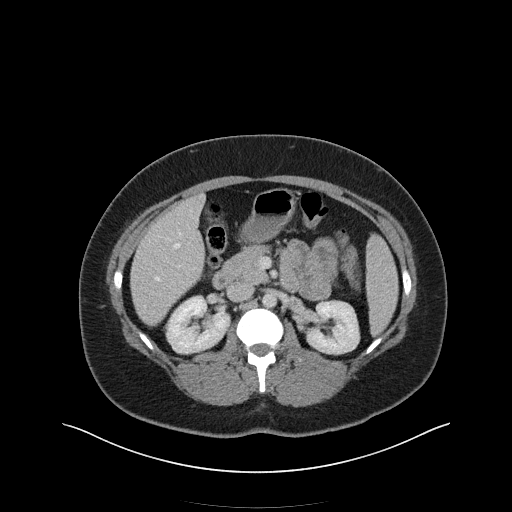
[im 81/103  soft-tissue]
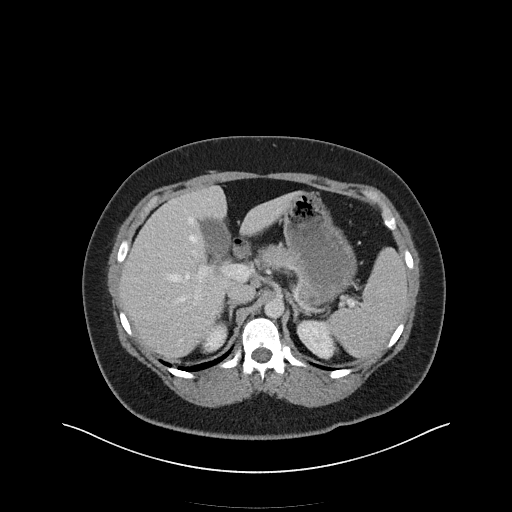
[im 90/103  soft-tissue]
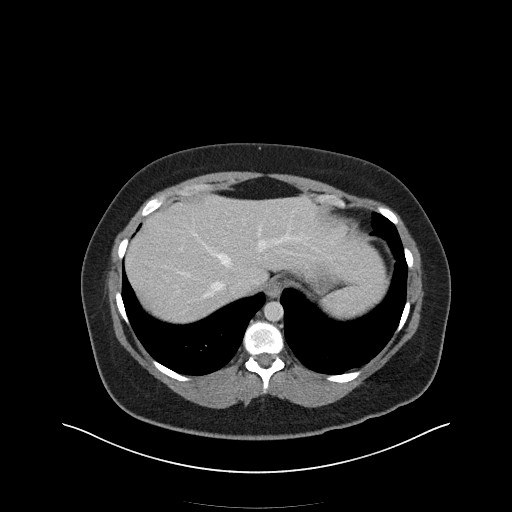
[im 98/103  soft-tissue]
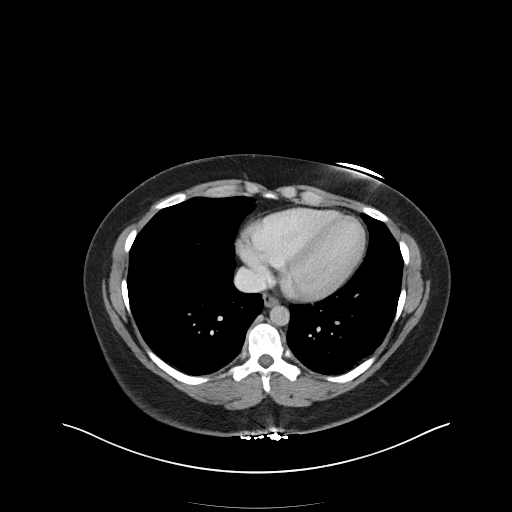

[Series 6: coronal soft tissue · coronal · 0.80mm/px · 3 of 101 slices shown]
[im 34/101  soft-tissue]
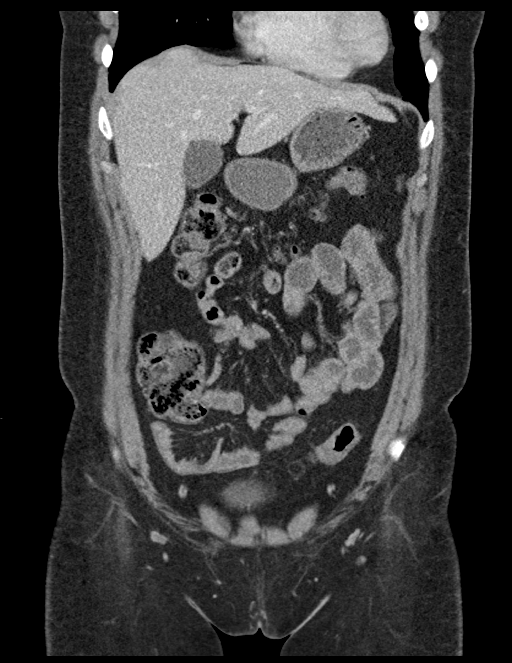
[im 45/101  soft-tissue]
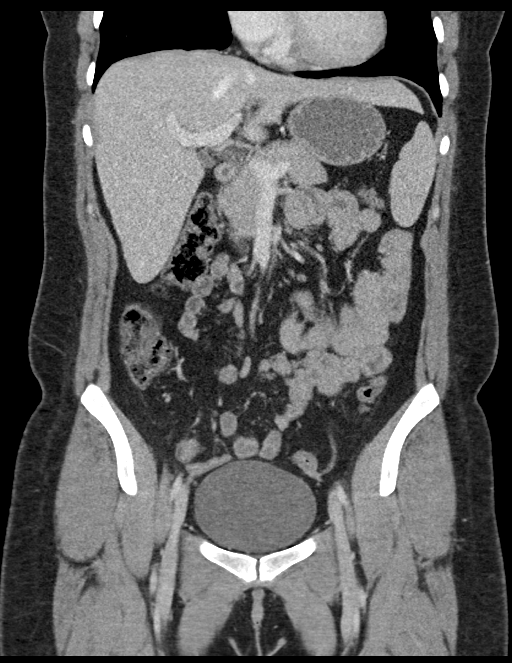
[im 56/101  soft-tissue]
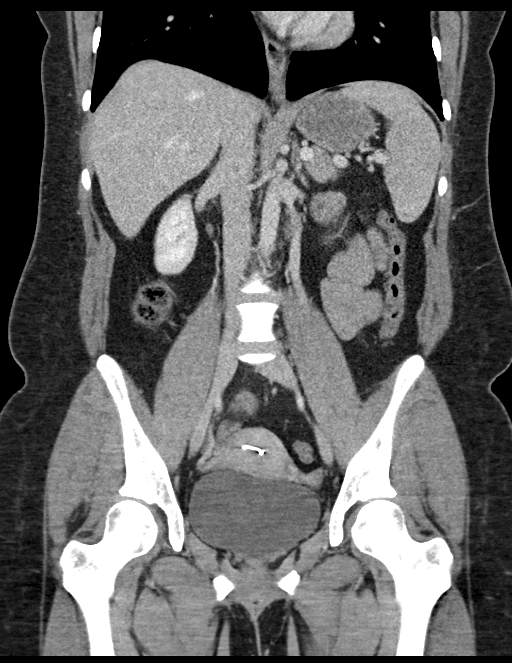

[16 of 46 positions shown; findings below may reference images not displayed]

FINDINGS: Lower chest: No acute abnormality.

Hepatobiliary: No solid liver abnormality is seen. No gallstones,
gallbladder wall thickening, or biliary dilatation.

Pancreas: Unremarkable. No pancreatic ductal dilatation or
surrounding inflammatory changes.

Spleen: Normal in size without significant abnormality.

Adrenals/Urinary Tract: Adrenal glands are unremarkable. Kidneys are
normal, without renal calculi, solid lesion, or hydronephrosis.
Bladder is unremarkable.

Stomach/Bowel: Stomach is within normal limits. Appendix appears
normal. No evidence of bowel wall thickening, distention, or
inflammatory changes.

Vascular/Lymphatic: No significant vascular findings are present. No
enlarged abdominal or pelvic lymph nodes.

Reproductive: No mass or other significant abnormality. IUD is
present in the endometrial cavity.

Other: No abdominal wall hernia or abnormality. No abdominopelvic
ascites.

Musculoskeletal: No acute or significant osseous findings.
IMPRESSION: No CT findings of the abdomen or pelvis to explain right-sided
cramping or diarrhea.

## 2019-09-16 IMAGING — US US ABDOMEN LIMITED
1 series · 14 of 25 positions shown · non-contrast
Comparison: CT abdomen pelvis dated [DATE].

CLINICAL DATA: Right upper quadrant pain and cramping

EXAM:
ULTRASOUND ABDOMEN LIMITED RIGHT UPPER QUADRANT

[Series 1: us abdomen limited · 14 of 55 slices shown]
[im 1/55]
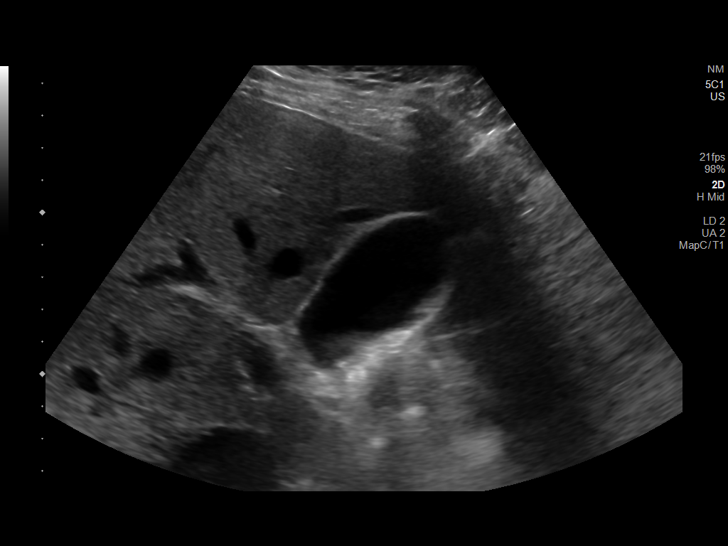
[im 5/55]
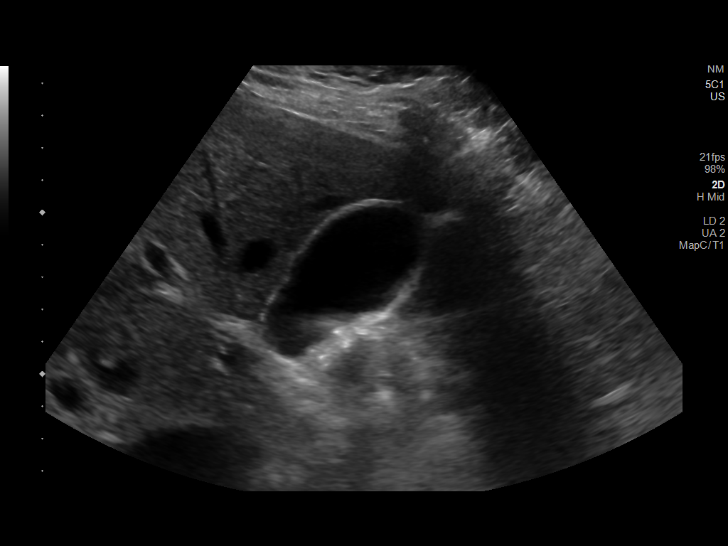
[im 10/55]
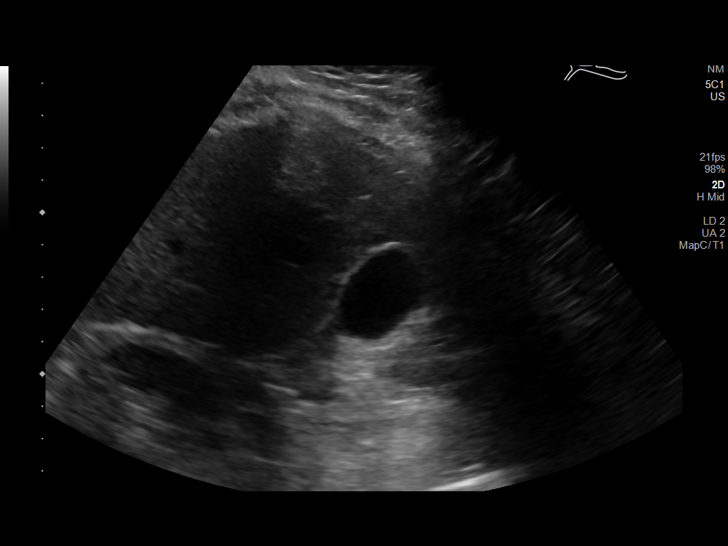
[im 14/55]
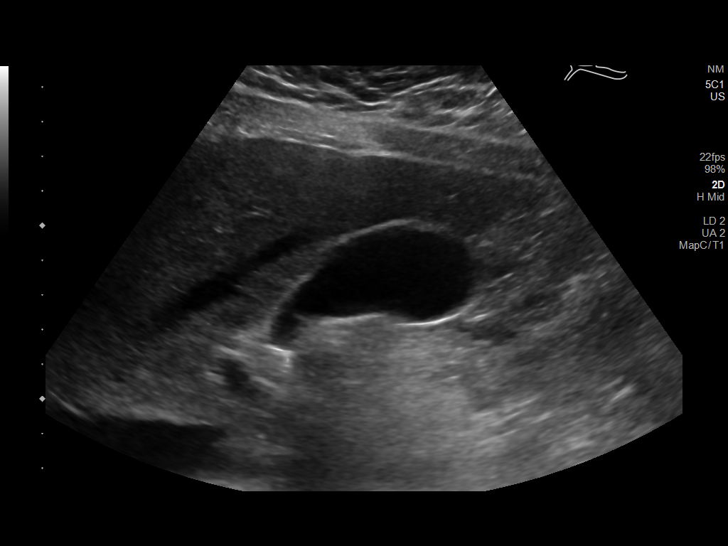
[im 19/55]
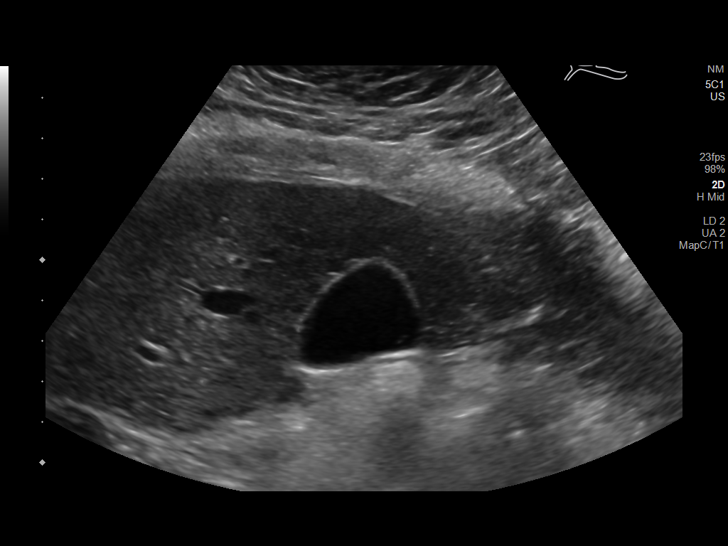
[im 21/55]
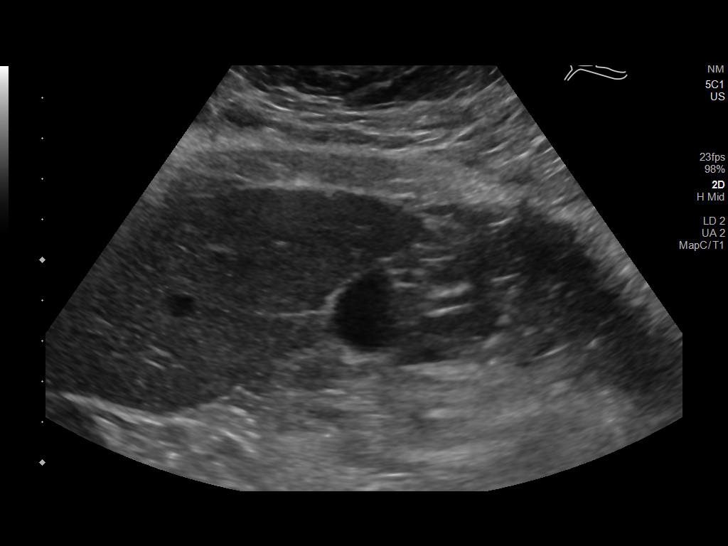
[im 25/55]
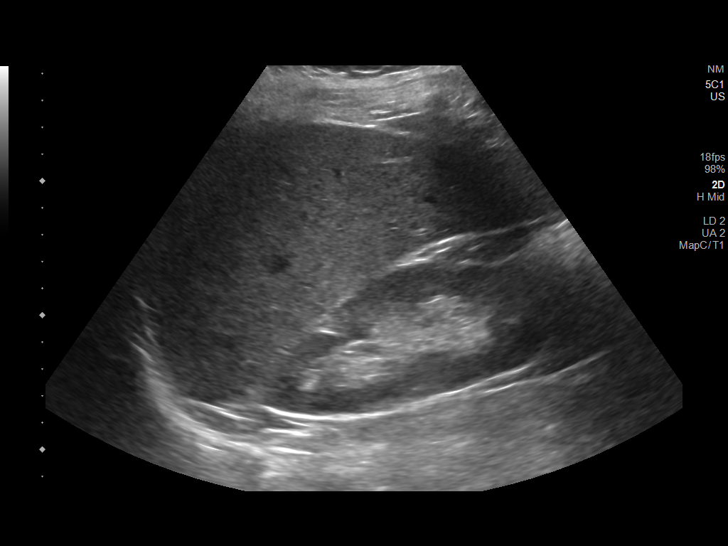
[im 30/55]
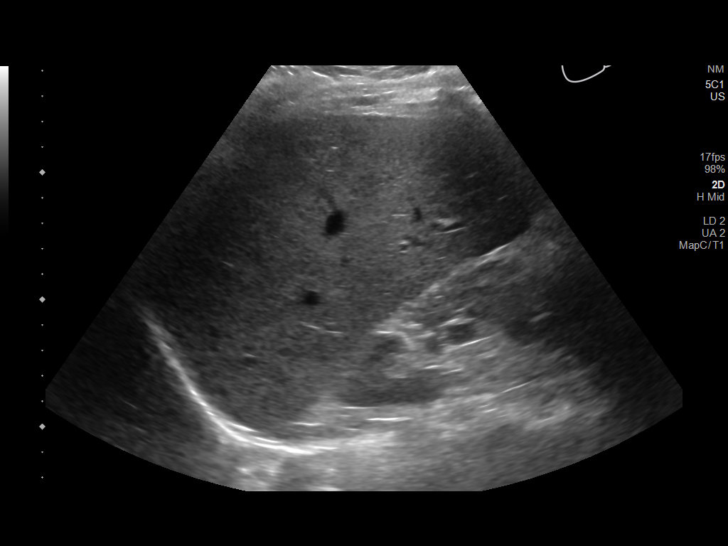
[im 34/55]
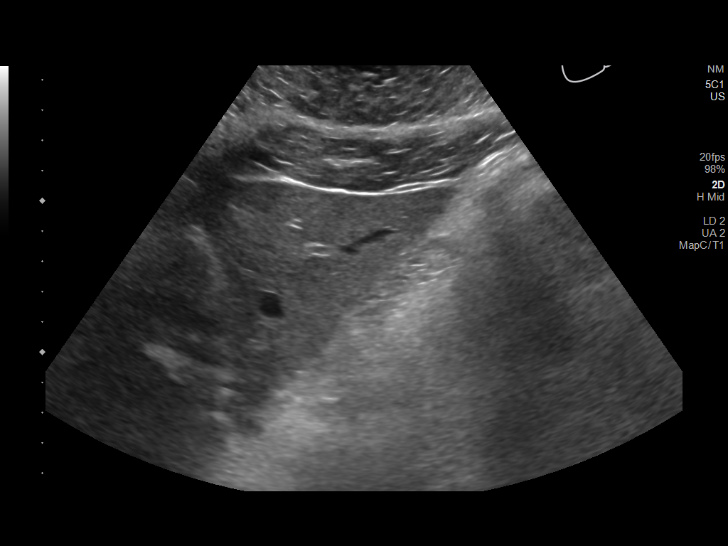
[im 37/55]
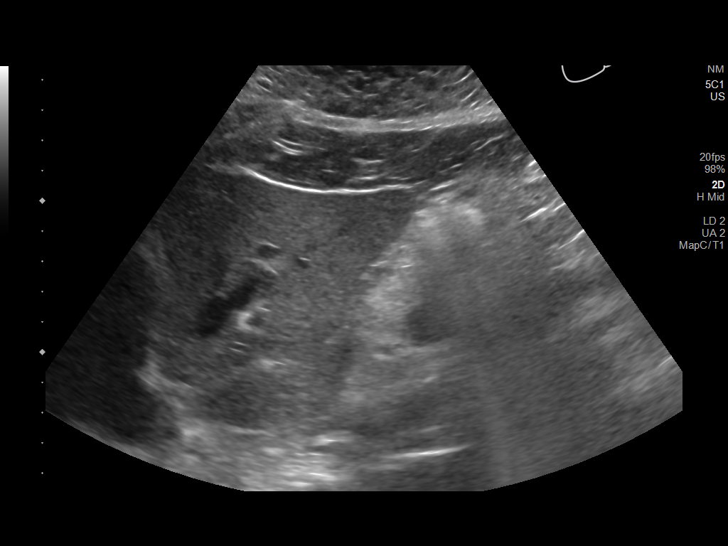
[im 41/55]
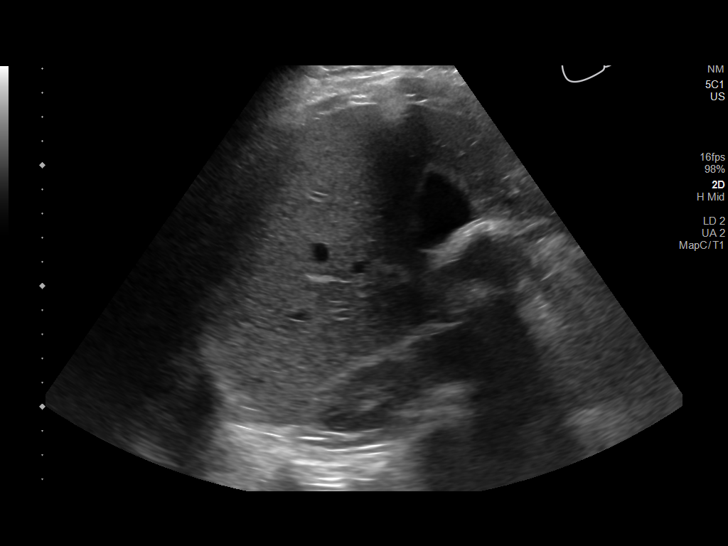
[im 46/55]
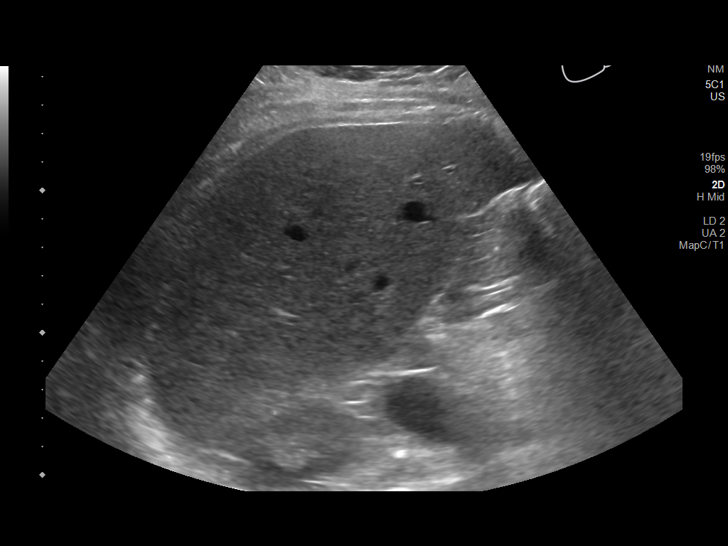
[im 50/55]
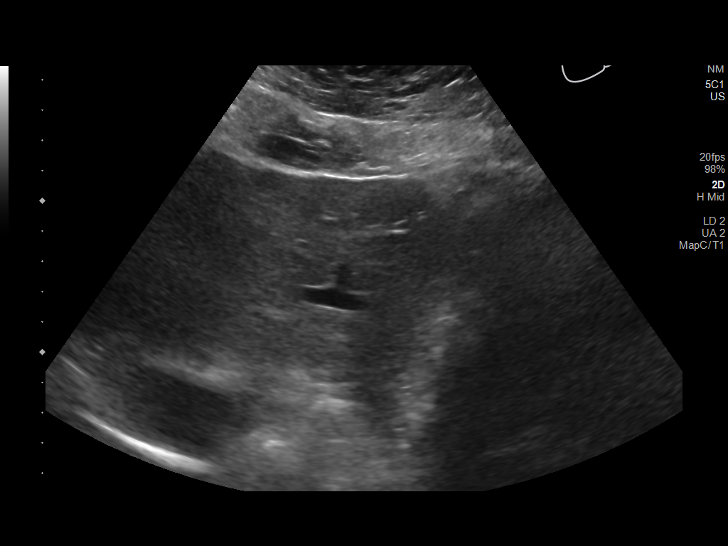
[im 55/55]
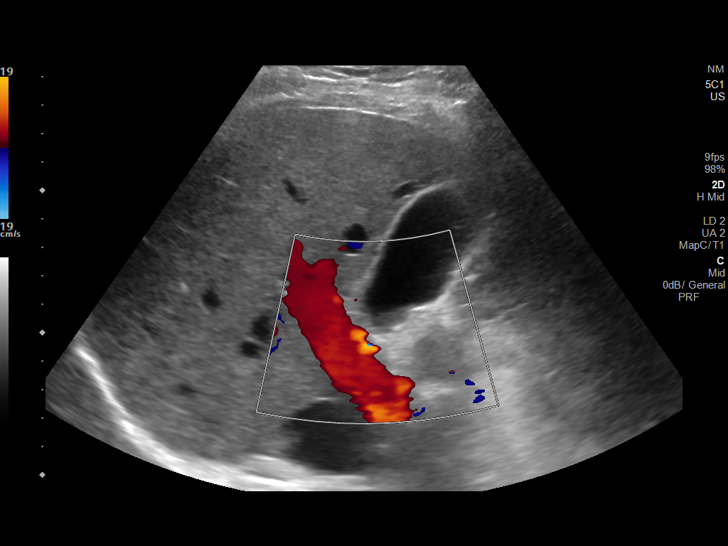

[14 of 25 positions shown; findings below may reference images not displayed]

FINDINGS: Gallbladder:

No gallstones or wall thickening visualized. No sonographic Murphy
sign noted by sonographer.

Common bile duct:

Diameter: 3 mm

Liver:

No focal lesion identified. Within normal limits in parenchymal
echogenicity. Portal vein is patent on color Doppler imaging with
normal direction of blood flow towards the liver.

Other: None.
IMPRESSION: No findings to explain the patient's symptoms.

## 2019-09-16 MED ORDER — CIPROFLOXACIN HCL 500 MG PO TABS: 500.0000 mg | ORAL_TABLET | Freq: Two times a day (BID) | ORAL | 0 refills | Status: DC

## 2019-09-16 MED ORDER — KETOROLAC TROMETHAMINE 30 MG/ML IJ SOLN
30.0000 mg | Freq: Once | INTRAMUSCULAR | Status: DC
  Filled 2019-09-16: qty 1

## 2019-09-16 MED ORDER — SODIUM CHLORIDE 0.9 % IV BOLUS
1000.0000 mL | Freq: Once | INTRAVENOUS | Status: AC
  Administered 2019-09-16: 1000 mL via INTRAVENOUS

## 2019-09-16 MED ORDER — MORPHINE SULFATE (PF) 4 MG/ML IV SOLN
4.0000 mg | Freq: Once | INTRAVENOUS | Status: AC
  Administered 2019-09-16: 4 mg via INTRAVENOUS
  Filled 2019-09-16: qty 1

## 2019-09-16 MED ORDER — KETOROLAC TROMETHAMINE 60 MG/2ML IM SOLN
60.0000 mg | Freq: Once | INTRAMUSCULAR | Status: AC
  Administered 2019-09-16: 60 mg via INTRAMUSCULAR
  Filled 2019-09-16: qty 2

## 2019-09-16 MED ORDER — IOHEXOL 300 MG/ML  SOLN
100.0000 mL | Freq: Once | INTRAMUSCULAR | Status: AC | PRN
  Administered 2019-09-16: 100 mL via INTRAVENOUS

## 2019-09-16 MED ORDER — ONDANSETRON HCL 4 MG/2ML IJ SOLN
4.0000 mg | Freq: Once | INTRAMUSCULAR | Status: AC
  Administered 2019-09-16: 4 mg via INTRAVENOUS
  Filled 2019-09-16: qty 2

## 2019-09-16 MED ORDER — SODIUM CHLORIDE 0.9% FLUSH
3.0000 mL | Freq: Once | INTRAVENOUS | Status: AC
  Administered 2019-09-16: 3 mL via INTRAVENOUS

## 2019-09-16 NOTE — ED Triage Notes (Signed)
C/o R side cramping x 3 days and diarrhea x 1 week.  Denies nausea and vomiting.

## 2019-09-16 NOTE — Discharge Instructions (Addendum)
Return here as needed.  Follow-up with the GI doctors provided.  He could use Imodium to see if that does help with your diarrhea.  But I would advise the use of probiotics as that will help.

## 2019-09-16 NOTE — ED Notes (Signed)
ED Provider at bedside. 

## 2019-09-16 NOTE — ED Notes (Signed)
Patient verbalizes understanding of discharge instructions. Opportunity for questioning and answers were provided. Armband removed by staff, pt discharged from ED.  

## 2019-09-16 NOTE — ED Notes (Signed)
Pt in CT.

## 2019-09-16 NOTE — ED Provider Notes (Signed)
[facility] EMERGENCY DEPARTMENT Provider Note   CSN: [id number] Arrival date & time: 09/16/19  1544     History   Chief Complaint Chief Complaint  Patient presents with   Abdominal Pain   Diarrhea    HPI Michele Avila is a 28 y.o. female.     HPI Patient presents to the emergency department with upper abdominal pain that started 1 week ago.  The patient states she has had some crampy type pain in the right upper quadrant and states that she had diarrhea for a week.  The patient states that she ate at a taco truck and the symptoms started shortly thereafter.  The patient states she does have IBS and was concerned that this could have flared something for her.  Patient states she has had no other symptoms.  Patient did not take any medications prior to arrival for her symptoms.  The patient denies chest pain, shortness of breath, headache,blurred vision, neck pain, fever, cough, weakness, numbness, dizziness, anorexia, edema,  nausea, vomiting, rash, back pain, dysuria, hematemesis, bloody stool, near syncope, or syncope. Past Medical History:  Diagnosis Date   Anemia    Asthma    hx chronic bronchitis/exercise induced asthma as pre teen only   Environmental allergies    Infection    UTI   Migraine    Seasonal allergies    Wrist fracture, right     Patient Active Problem List   Diagnosis Date Noted   Acute pain of left knee 05/24/2017   Pain in right wrist 05/24/2017   Mirena intrauterine device in place 03/02/2016    Past Surgical History:  Procedure Laterality Date   CESAREAN SECTION  2008   CESAREAN SECTION N/A 06/15/2013   Procedure: CESAREAN SECTION;  Surgeon: Allyn Kenner, DO;  Location: [facility];  Service: Obstetrics;  Laterality: N/A;   DILATION AND CURETTAGE OF UTERUS  08/2011   FOOT SURGERY  2017   INDUCED ABORTION       OB History    Gravida  5   Para  2   Term  2   Preterm      AB  3   Living  2     SAB        TAB  2   Ectopic      Multiple      Live Births  1            Home Medications    Prior to Admission medications   Medication Sig Start Date End Date Taking? Authorizing Provider  albuterol (PROVENTIL HFA;VENTOLIN HFA) 108 (90 Base) MCG/ACT inhaler Inhale 1-2 puffs into the lungs every 6 (six) hours as needed for wheezing or shortness of breath. 12/12/18   Wardell Honour, MD  cyclobenzaprine (FLEXERIL) 10 MG tablet Take 1 tablet (10 mg total) by mouth 3 (three) times daily as needed for muscle spasms. Patient not taking: Reported on 05/07/2019 12/04/18   Orpah Greek, MD  esomeprazole (NEXIUM) 40 MG capsule Take 1 capsule (40 mg total) by mouth daily. 07/12/19   Vanessa Kick, MD  ibuprofen (ADVIL,MOTRIN) 800 MG tablet Take 1 tablet (800 mg total) by mouth every 6 (six) hours as needed for moderate pain. Patient not taking: Reported on 05/07/2019 12/04/18   Orpah Greek, MD  Levonorgestrel (MIRENA, 52 MG, IU) 1 each by Intrauterine route once.     [provider]  naproxen (NAPROSYN) 500 MG tablet Take 1 tablet (500 mg total)  by mouth every 12 (twelve) hours as needed. Patient not taking: Reported on 05/07/2019 07/12/18   Pieter Partridge, DO  ondansetron (ZOFRAN ODT) 4 MG disintegrating tablet Take 1 tablet (4 mg total) by mouth every 8 (eight) hours as needed for nausea or vomiting. 03/31/19   Tomi Likens, Adam R, DO  SUMAtriptan (IMITREX) 100 MG tablet TAKE 1 TABLET AT EARLIEST ONSET OF MIGRAINE. MAY REPEAT ONCE IN 2 HOURS IF HEADACHE PERSISTS OR RECURS. NO MORE THAN 2 TABLETS PER 24 HOURS Patient taking differently: Take 100 mg by mouth See admin instructions. Take 100 mg by mouth at earliest onset of migraine- may repeat once in 2 hours if headache persists or recurs (Max of 2 tablets/24 hours) 03/31/19   Tomi Likens, Adam R, DO  tiZANidine (ZANAFLEX) 4 MG tablet Take 1 tablet (4 mg total) by mouth every 6 (six) hours as needed for muscle spasms. 08/01/19   Tomi Likens, Adam R,  DO  topiramate (TOPAMAX) 100 MG tablet Take 1 tablet (100 mg total) by mouth at bedtime. 03/31/19   Pieter Partridge, DO    Family History Family History  Problem Relation Age of Onset   Cancer Father        liver   Diabetes Father    Hypertension Father    Hypertension Mother    Hypertension Maternal Grandmother    Kidney disease Maternal Grandmother    Diabetes Paternal Grandmother    Hypertension Paternal Grandmother    Ulcers Brother    Crohn's disease Brother     Social History Social History   Tobacco Use   Smoking status: Current Every Day Smoker    Packs/day: 0.50    Types: Cigarettes   Smokeless tobacco: Never Used   Tobacco comment: down to 5 cigarrettes/day  Substance Use Topics   Alcohol use: Not Currently    Comment: occassionally    Drug use: Yes    Types: Marijuana    Comment: Last use 2 weeks ago late Aug 2020     Allergies   [name] [camphor-menthol], Yellow dyes (non-tartrazine), and Latex   Review of Systems Review of Systems All other systems negative except as documented in the HPI. All pertinent positives and negatives as reviewed in the HPI.  Physical Exam Updated Vital Signs BP (!) 150/83 (BP Location: Left Arm)    Pulse 93    Temp 99 F (37.2 C) (Oral)    Resp 17    SpO2 100%   Physical Exam Vitals signs and nursing note reviewed.  Constitutional:      General: She is not in acute distress.    Appearance: She is well-developed.  HENT:     Head: Normocephalic and atraumatic.  Eyes:     Pupils: Pupils are equal, round, and reactive to light.  Neck:     Musculoskeletal: Normal range of motion and neck supple.  Cardiovascular:     Rate and Rhythm: Normal rate and regular rhythm.     Heart sounds: Normal heart sounds. No murmur. No friction rub. No gallop.   Pulmonary:     Effort: Pulmonary effort is normal. No respiratory distress.     Breath sounds: Normal breath sounds. No wheezing.  Abdominal:     General: Bowel  sounds are normal. There is no distension.     Palpations: Abdomen is soft.     Tenderness: There is abdominal tenderness in the right upper quadrant and epigastric area. There is no guarding or rebound.  Skin:  General: Skin is warm and dry.     Capillary Refill: Capillary refill takes less than 2 seconds.     Findings: No erythema or rash.  Neurological:     Mental Status: She is alert and oriented to person, place, and time.     Motor: No abnormal muscle tone.     Coordination: Coordination normal.  Psychiatric:        Behavior: Behavior normal.      ED Treatments / Results  Labs (all labs ordered are listed, but only abnormal results are displayed) Labs Reviewed  COMPREHENSIVE METABOLIC PANEL - Abnormal; Notable for the following components:      Result Value   Glucose, Bld 102 (*)    All other components within normal limits  URINALYSIS, ROUTINE W REFLEX MICROSCOPIC - Abnormal; Notable for the following components:   APPearance CLOUDY (*)    Hgb urine dipstick SMALL (*)    Ketones, ur 5 (*)    Leukocytes,Ua TRACE (*)    Bacteria, UA RARE (*)    Non Squamous Epithelial 0-5 (*)    All other components within normal limits  LIPASE, BLOOD  CBC  I-STAT BETA HCG BLOOD, ED ([facility], [facility], [facility] ONLY)    EKG None  Radiology Ct Abdomen Pelvis W Contrast  Result Date: 09/16/2019 CLINICAL DATA:  Abdominal pain, right-sided cramping and diarrhea EXAM: CT ABDOMEN AND PELVIS WITH CONTRAST TECHNIQUE: Multidetector CT imaging of the abdomen and pelvis was performed using the standard protocol following bolus administration of intravenous contrast. CONTRAST:  162mL OMNIPAQUE IOHEXOL 300 MG/ML  SOLN COMPARISON:  None. FINDINGS: Lower chest: No acute abnormality. Hepatobiliary: No solid liver abnormality is seen. No gallstones, gallbladder wall thickening, or biliary dilatation. Pancreas: Unremarkable. No pancreatic ductal dilatation or surrounding inflammatory changes. Spleen: Normal in size  without significant abnormality. Adrenals/Urinary Tract: Adrenal glands are unremarkable. Kidneys are normal, without renal calculi, solid lesion, or hydronephrosis. Bladder is unremarkable. Stomach/Bowel: Stomach is within normal limits. Appendix appears normal. No evidence of bowel wall thickening, distention, or inflammatory changes. Vascular/Lymphatic: No significant vascular findings are present. No enlarged abdominal or pelvic lymph nodes. Reproductive: No mass or other significant abnormality. IUD is present in the endometrial cavity. Other: No abdominal wall hernia or abnormality. No abdominopelvic ascites. Musculoskeletal: No acute or significant osseous findings. IMPRESSION: No CT findings of the abdomen or pelvis to explain right-sided cramping or diarrhea. Electronically Signed   By: Eddie Candle M.D.   On: 09/16/2019 21:18   US Abdomen Limited Ruq  Result Date: 09/16/2019 CLINICAL DATA:  Right upper quadrant pain and cramping EXAM: ULTRASOUND ABDOMEN LIMITED RIGHT UPPER QUADRANT COMPARISON:  CT abdomen pelvis dated 06/04/2010. FINDINGS: Gallbladder: No gallstones or wall thickening visualized. No sonographic Murphy sign noted by sonographer. Common bile duct: Diameter: 3 mm Liver: No focal lesion identified. Within normal limits in parenchymal echogenicity. Portal vein is patent on color Doppler imaging with normal direction of blood flow towards the liver. Other: None. IMPRESSION: No findings to explain the patient's symptoms. Electronically Signed   By: Zerita Boers M.D.   On: 09/16/2019 20:08    Procedures Procedures (including critical care time)  Medications Ordered in ED Medications  sodium chloride flush (NS) 0.9 % injection 3 mL (3 mLs Intravenous Given 09/16/19 1751)  sodium chloride 0.9 % bolus 1,000 mL (0 mLs Intravenous Stopped 09/16/19 2028)  morphine 4 MG/ML injection 4 mg (4 mg Intravenous Given 09/16/19 1810)  ondansetron (ZOFRAN) injection 4 mg (4 mg Intravenous Given  09/16/19 1810)  iohexol (OMNIPAQUE) 300 MG/ML solution 100 mL (100 mLs Intravenous Contrast Given 09/16/19 2032)     Initial Impression / Assessment and Plan / ED Course  I have reviewed the triage vital signs and the nursing notes.  Pertinent labs & imaging results that were available during my care of the patient were reviewed by me and considered in my medical decision making (see chart for details).        Patient has a negative CT and ultrasound here in the emergency department.  The patient's laboratory testing does not yield a significant abnormalities.  I feel the patient's diarrhea is related to the food that she ingested along with her IBS.  The patient has been stable here in the emergency department.  Patient will be referred to GI and also given referral to primary care doctors.  Patient agrees the plan and all questions were answered.  I did advise her to return here for any worsening in her condition. Final Clinical Impressions(s) / ED Diagnoses   Final diagnoses:  RUQ abdominal tenderness    ED Discharge Orders    None       Rebeca Allegra 09/16/19 2221    Elnora Morrison, MD 09/17/19 [phone]

## 2019-10-12 ENCOUNTER — Encounter (HOSPITAL_COMMUNITY): Payer: Self-pay

## 2019-10-12 ENCOUNTER — Ambulatory Visit (HOSPITAL_COMMUNITY)
Admission: EM | Admit: 2019-10-12 | Discharge: 2019-10-12 | Disposition: A | Discharge status: Home / Self Care | Payer: Medicaid Other | Attending: Internal Medicine | Admitting: Internal Medicine

## 2019-10-12 ENCOUNTER — Other Ambulatory Visit: Payer: Self-pay

## 2019-10-12 DIAGNOSIS — R21 Rash and other nonspecific skin eruption: Secondary | ICD-10-CM

## 2019-10-12 MED ORDER — PREDNISONE 50 MG PO TABS: 50.0000 mg | ORAL_TABLET | Freq: Every day | ORAL | 0 refills | Status: AC

## 2019-10-12 MED ORDER — PERMETHRIN 5 % EX CREA: TOPICAL_CREAM | CUTANEOUS | 0 refills | Status: DC

## 2019-10-12 MED ORDER — LORATADINE 10 MG PO TABS: 10.0000 mg | ORAL_TABLET | Freq: Every day | ORAL | 0 refills | Status: DC

## 2019-10-12 NOTE — Discharge Instructions (Signed)
Apply permethrin cream head to toe, leave on overnight and rinse off in the morning Begin prednisone daily for the next 5 days, take with food Take daily Claritin to help further with itching Please wash all linens and clothes in hot water Please follow-up if rash not resolving or worsening

## 2019-10-12 NOTE — ED Provider Notes (Addendum)
[facility]    CSN: [id number] Arrival date & time: 10/12/19  1318      History   Chief Complaint Chief Complaint  Patient presents with  . Rash    HPI Michele Avila is a 28 y.o. female history of asthma, migraines, presenting today for evaluation of a rash.  Patient states that the rash began on Monday and since that spread and become more itchy.  Denies associated pain.  Has burning when applying hand sanitizer.  Mainly has noticed to arms and legs.  Denies close contacts with similar rash.  Denies any new soaps lotions or detergents.  Denies any new foods or medicines.  Does note symptoms began after she spent the night at a friend's home.  She denies any fevers.  Denies headaches.  She has not used anything on her rash.  She is unsure if she had the chickenpox vaccine when she was pregnant, denies having chickenpox when she was younger.  HPI  Past Medical History:  Diagnosis Date  . Anemia   . Asthma    hx chronic bronchitis/exercise induced asthma as pre teen only  . Environmental allergies   . Infection    UTI  . Migraine   . Seasonal allergies   . Wrist fracture, right     Patient Active Problem List   Diagnosis Date Noted  . Acute pain of left knee 05/24/2017  . Pain in right wrist 05/24/2017  . Mirena intrauterine device in place 03/02/2016    Past Surgical History:  Procedure Laterality Date  . CESAREAN SECTION  2008  . CESAREAN SECTION N/A 06/15/2013   Procedure: CESAREAN SECTION;  Surgeon: Allyn Kenner, DO;  Location: [facility];  Service: Obstetrics;  Laterality: N/A;  . DILATION AND CURETTAGE OF UTERUS  08/2011  . FOOT SURGERY  2017  . INDUCED ABORTION      OB History    Gravida  5   Para  2   Term  2   Preterm      AB  3   Living  2     SAB      TAB  2   Ectopic      Multiple      Live Births  1            Home Medications    Prior to Admission medications   Medication Sig Start Date End Date Taking?  Authorizing Provider  albuterol (PROVENTIL HFA;VENTOLIN HFA) 108 (90 Base) MCG/ACT inhaler Inhale 1-2 puffs into the lungs every 6 (six) hours as needed for wheezing or shortness of breath. 12/12/18   Wardell Honour, MD  ciprofloxacin (CIPRO) 500 MG tablet Take 1 tablet (500 mg total) by mouth 2 (two) times daily. 09/16/19   Lawyer, Harrell Gave, PA-C  cyclobenzaprine (FLEXERIL) 10 MG tablet Take 1 tablet (10 mg total) by mouth 3 (three) times daily as needed for muscle spasms. 12/04/18   Orpah Greek, MD  Levonorgestrel (MIRENA, 52 MG, IU) 1 each by Intrauterine route once.     [provider]  loratadine (CLARITIN) 10 MG tablet Take 1 tablet (10 mg total) by mouth daily. 10/12/19   Tavia Stave C, PA-C  ondansetron (ZOFRAN ODT) 4 MG disintegrating tablet Take 1 tablet (4 mg total) by mouth every 8 (eight) hours as needed for nausea or vomiting. 03/31/19   Pieter Partridge, DO  permethrin (ELIMITE) 5 % cream Apply to affected area once 10/12/19   [name], [place] C, PA-C  predniSONE (DELTASONE) 50 MG tablet Take 1 tablet (50 mg total) by mouth daily for 5 days. 10/12/19 10/17/19  Anjelo Pullman C, PA-C  SUMAtriptan (IMITREX) 100 MG tablet TAKE 1 TABLET AT EARLIEST ONSET OF MIGRAINE. MAY REPEAT ONCE IN 2 HOURS IF HEADACHE PERSISTS OR RECURS. NO MORE THAN 2 TABLETS PER 24 HOURS Patient taking differently: Take 100 mg by mouth See admin instructions. Take 100 mg by mouth at earliest onset of migraine- may repeat once in 2 hours if headache persists or recurs (Max of 2 tablets/24 hours) 03/31/19   Tomi Likens, Adam R, DO  tiZANidine (ZANAFLEX) 4 MG tablet Take 1 tablet (4 mg total) by mouth every 6 (six) hours as needed for muscle spasms. 08/01/19   Tomi Likens, Adam R, DO  topiramate (TOPAMAX) 100 MG tablet Take 1 tablet (100 mg total) by mouth at bedtime. 03/31/19   Pieter Partridge, DO  esomeprazole (NEXIUM) 40 MG capsule Take 1 capsule (40 mg total) by mouth daily. Patient not taking: Reported on  09/16/2019 07/12/19 10/12/19  Vanessa Kick, MD    Family History Family History  Problem Relation Age of Onset  . Cancer Father        liver  . Diabetes Father   . Hypertension Father   . Hypertension Mother   . Hypertension Maternal Grandmother   . Kidney disease Maternal Grandmother   . Diabetes Paternal Grandmother   . Hypertension Paternal Grandmother   . Ulcers Brother   . Crohn's disease Brother     Social History Social History   Tobacco Use  . Smoking status: Current Every Day Smoker    Packs/day: 0.50    Types: Cigarettes  . Smokeless tobacco: Never Used  . Tobacco comment: down to 5 cigarrettes/day  Substance Use Topics  . Alcohol use: Not Currently    Comment: occassionally   . Drug use: Yes    Types: Marijuana    Comment: Last use 2 weeks ago late Aug 2020     Allergies   [name] [camphor-menthol], Yellow dyes (non-tartrazine), and Latex   Review of Systems Review of Systems  Constitutional: Negative for fatigue and fever.  Eyes: Negative for visual disturbance.  Respiratory: Negative for shortness of breath.   Cardiovascular: Negative for chest pain.  Gastrointestinal: Negative for abdominal pain, nausea and vomiting.  Musculoskeletal: Negative for arthralgias and joint swelling.  Skin: Positive for color change and rash. Negative for wound.  Neurological: Negative for dizziness, weakness, light-headedness and headaches.     Physical Exam Triage Vital Signs ED Triage Vitals [10/12/19 1407]  Enc Vitals Group     BP 128/80     Pulse Rate 92     Resp 18     Temp 98.5 F (36.9 C)     Temp Source Oral     SpO2 100 %     Weight 220 lb (99.8 kg)     Height      Head Circumference      Peak Flow      Pain Score 0     Pain Loc      Pain Edu?      Excl. in [place]?    No data found.  Updated Vital Signs BP 128/80 (BP Location: Right Arm)   Pulse 92   Temp 98.5 F (36.9 C) (Oral)   Resp 18   Wt 220 lb (99.8 kg)   SpO2 100%   BMI 32.49  kg/m   Visual Acuity Right Eye Distance:  Left Eye Distance:   Bilateral Distance:    Right Eye Near:   Left Eye Near:    Bilateral Near:     Physical Exam Vitals signs and nursing note reviewed.  Constitutional:      Appearance: She is well-developed.     Comments: No acute distress  HENT:     Head: Normocephalic and atraumatic.     Nose: Nose normal.     Mouth/Throat:     Comments: No lesions on oral mucosa, posterior pharynx patent Eyes:     Conjunctiva/sclera: Conjunctivae normal.  Neck:     Musculoskeletal: Neck supple.  Cardiovascular:     Rate and Rhythm: Normal rate.  Pulmonary:     Effort: Pulmonary effort is normal. No respiratory distress.     Comments: Breathing comfortably at rest, CTABL, no wheezing, rales or other adventitious sounds auscultated Abdominal:     General: There is no distension.  Musculoskeletal: Normal range of motion.  Skin:    General: Skin is warm and dry.     Comments: Erythematous small papular lesions noted to dorsum of hands, upper extremities, dorsum of feet as well as lower extremities.  Few lesions noted to trunk, no lesions noted on palms or soles  Neurological:     Mental Status: She is alert and oriented to person, place, and time.      UC Treatments / Results  Labs (all labs ordered are listed, but only abnormal results are displayed) Labs Reviewed - No data to display  EKG   Radiology No results found.  Procedures Procedures (including critical care time)  Medications Ordered in UC Medications - No data to display  Initial Impression / Assessment and Plan / UC Course  I have reviewed the triage vital signs and the nursing notes.  Pertinent labs & imaging results that were available during my care of the patient were reviewed by me and considered in my medical decision making (see chart for details).    Does not appear infectious, does not appear suggestive of chickenpox at this time.  No vesicles. Rash  possible scabies versus other contact dermatitis.  Providing permethrin to apply topically once followed by course of prednisone and Claritin.  Discussed rinsing all linens in hot water.  Continue to monitor rash,Discussed strict return precautions. Patient verbalized understanding and is agreeable with plan.  Final Clinical Impressions(s) / UC Diagnoses   Final diagnoses:  Rash and nonspecific skin eruption     Discharge Instructions     Apply permethrin cream head to toe, leave on overnight and rinse off in the morning Begin prednisone daily for the next 5 days, take with food Take daily Claritin to help further with itching Please wash all linens and clothes in hot water Please follow-up if rash not resolving or worsening   ED Prescriptions    Medication Sig Dispense Auth. Provider   permethrin (ELIMITE) 5 % cream Apply to affected area once 60 g Billie Trager C, PA-C   predniSONE (DELTASONE) 50 MG tablet Take 1 tablet (50 mg total) by mouth daily for 5 days. 5 tablet June Rode C, PA-C   loratadine (CLARITIN) 10 MG tablet Take 1 tablet (10 mg total) by mouth daily. 12 tablet Twylah Bennetts, [place] C, PA-C     PDMP not reviewed this encounter.   Joneen Caraway Taylor C, PA-C 10/12/19 1507    Janith Lima, PA-C 10/12/19 1508

## 2019-10-12 NOTE — ED Triage Notes (Addendum)
Pt. States on Monday she had a rash on her since the rash has spread all over her body. It itches constantly, when she puts hand sanitizer on it burns.

## 2019-12-03 ENCOUNTER — Emergency Department (HOSPITAL_COMMUNITY)
Admission: EM | Admit: 2019-12-03 | Discharge: 2019-12-03 | Disposition: A | Discharge status: Home / Self Care | Payer: Medicaid Other | Attending: Emergency Medicine | Admitting: Emergency Medicine

## 2019-12-03 ENCOUNTER — Other Ambulatory Visit: Payer: Self-pay

## 2019-12-03 ENCOUNTER — Encounter (HOSPITAL_COMMUNITY): Payer: Self-pay | Admitting: Emergency Medicine

## 2019-12-03 DIAGNOSIS — G43809 Other migraine, not intractable, without status migrainosus: Secondary | ICD-10-CM | POA: Insufficient documentation

## 2019-12-03 DIAGNOSIS — J45909 Unspecified asthma, uncomplicated: Secondary | ICD-10-CM | POA: Diagnosis not present

## 2019-12-03 DIAGNOSIS — R519 Headache, unspecified: Secondary | ICD-10-CM | POA: Diagnosis present

## 2019-12-03 DIAGNOSIS — F17210 Nicotine dependence, cigarettes, uncomplicated: Secondary | ICD-10-CM | POA: Diagnosis not present

## 2019-12-03 LAB — I-STAT BETA HCG BLOOD, ED (MC, WL, AP ONLY): I-stat hCG, quantitative: <5 m[IU]/mL (ref <5)

## 2019-12-03 MED ORDER — ONDANSETRON HCL 4 MG/2ML IJ SOLN
4.0000 mg | Freq: Once | INTRAMUSCULAR | Status: AC
  Administered 2019-12-03: 4 mg via INTRAVENOUS
  Filled 2019-12-03: qty 2

## 2019-12-03 MED ORDER — SODIUM CHLORIDE 0.9 % IV BOLUS
1000.0000 mL | Freq: Once | INTRAVENOUS | Status: AC
  Administered 2019-12-03: 1000 mL via INTRAVENOUS

## 2019-12-03 MED ORDER — METOCLOPRAMIDE HCL 5 MG/ML IJ SOLN
10.0000 mg | Freq: Once | INTRAMUSCULAR | Status: AC
  Administered 2019-12-03: 10 mg via INTRAVENOUS
  Filled 2019-12-03: qty 2

## 2019-12-03 MED ORDER — KETOROLAC TROMETHAMINE 30 MG/ML IJ SOLN
30.0000 mg | Freq: Once | INTRAMUSCULAR | Status: AC
  Administered 2019-12-03: 30 mg via INTRAVENOUS
  Filled 2019-12-03: qty 1

## 2019-12-03 MED ORDER — DEXAMETHASONE SODIUM PHOSPHATE 10 MG/ML IJ SOLN
10.0000 mg | Freq: Once | INTRAMUSCULAR | Status: AC
  Administered 2019-12-03: 10 mg via INTRAVENOUS
  Filled 2019-12-03: qty 1

## 2019-12-03 MED ORDER — DIPHENHYDRAMINE HCL 50 MG/ML IJ SOLN
25.0000 mg | Freq: Once | INTRAMUSCULAR | Status: AC
  Administered 2019-12-03: 25 mg via INTRAVENOUS
  Filled 2019-12-03: qty 1

## 2019-12-03 NOTE — ED Triage Notes (Signed)
C/o migraine since last night with nausea and vomiting.  Took Imitrex and Zofran and states she vomited afterwards.  Called PCP and told to come to ED.

## 2019-12-03 NOTE — ED Provider Notes (Signed)
[facility] EMERGENCY DEPARTMENT Provider Note   CSN: [id number] Arrival date & time: 12/03/19  1343     History Chief Complaint  Patient presents with  . Migraine    Michele Avila is a 29 y.o. female past medical history of migraines, asthma who presents for evaluation of headache that began last night.  She reports that last night prior to be going to bed, she started developing a mild headache that was consistent with her previous migraines.  She states she tried drinking water and taking medication but states that the headache continued to get gradually worse.  She then started having nausea/vomiting and was unable to keep any of her medications down. She states that the headache is sharp and is mostly on the left side of her head.  She states that this is similar to her previous history of migraines and that she regularly gets a migraine cocktail from her primary care doctor but he was not open today prompting ED visit.  She reports associated blurry vision and photophobia.  She has not noted any fevers, difficulty walking, numbness/weakness of her arms or legs, chest pain, difficulty breathing.  She denies any preceding trauma, injury, fall.  She is not currently on any OCPs.  The history is provided by the patient.       Past Medical History:  Diagnosis Date  . Anemia   . Asthma    hx chronic bronchitis/exercise induced asthma as pre teen only  . Environmental allergies   . Infection    UTI  . Migraine   . Seasonal allergies   . Wrist fracture, right     Patient Active Problem List   Diagnosis Date Noted  . Acute pain of left knee 05/24/2017  . Pain in right wrist 05/24/2017  . Mirena intrauterine device in place 03/02/2016    Past Surgical History:  Procedure Laterality Date  . CESAREAN SECTION  2008  . CESAREAN SECTION N/A 06/15/2013   Procedure: CESAREAN SECTION;  Surgeon: Allyn Kenner, DO;  Location: [facility];  Service: Obstetrics;   Laterality: N/A;  . DILATION AND CURETTAGE OF UTERUS  08/2011  . FOOT SURGERY  2017  . INDUCED ABORTION       OB History    Gravida  5   Para  2   Term  2   Preterm      AB  3   Living  2     SAB      TAB  2   Ectopic      Multiple      Live Births  1           Family History  Problem Relation Age of Onset  . Cancer Father        liver  . Diabetes Father   . Hypertension Father   . Hypertension Mother   . Hypertension Maternal Grandmother   . Kidney disease Maternal Grandmother   . Diabetes Paternal Grandmother   . Hypertension Paternal Grandmother   . Ulcers Brother   . Crohn's disease Brother     Social History   Tobacco Use  . Smoking status: Current Every Day Smoker    Packs/day: 0.50    Types: Cigarettes  . Smokeless tobacco: Never Used  . Tobacco comment: down to 5 cigarrettes/day  Substance Use Topics  . Alcohol use: Not Currently    Comment: occassionally   . Drug use: Yes    Types: Marijuana  Comment: Last use 2 weeks ago late Aug 2020    Home Medications Prior to Admission medications   Medication Sig Start Date End Date Taking? Authorizing Provider  albuterol (PROVENTIL HFA;VENTOLIN HFA) 108 (90 Base) MCG/ACT inhaler Inhale 1-2 puffs into the lungs every 6 (six) hours as needed for wheezing or shortness of breath. 12/12/18  Yes Wardell Honour, MD  cyclobenzaprine (FLEXERIL) 10 MG tablet Take 1 tablet (10 mg total) by mouth 3 (three) times daily as needed for muscle spasms. 12/04/18  Yes Pollina, Gwenyth Allegra, MD  Levonorgestrel (MIRENA, 52 MG, IU) 1 each by Intrauterine route once.    Yes [provider]  ondansetron (ZOFRAN ODT) 4 MG disintegrating tablet Take 1 tablet (4 mg total) by mouth every 8 (eight) hours as needed for nausea or vomiting. 03/31/19  Yes Jaffe, Adam R, DO  SUMAtriptan (IMITREX) 100 MG tablet TAKE 1 TABLET AT EARLIEST ONSET OF MIGRAINE. MAY REPEAT ONCE IN 2 HOURS IF HEADACHE PERSISTS OR RECURS. NO  MORE THAN 2 TABLETS PER 24 HOURS Patient taking differently: Take 100 mg by mouth daily as needed for migraine or headache.  03/31/19  Yes Jaffe, Adam R, DO  tiZANidine (ZANAFLEX) 4 MG tablet Take 1 tablet (4 mg total) by mouth every 6 (six) hours as needed for muscle spasms. 08/01/19  Yes Jaffe, Adam R, DO  topiramate (TOPAMAX) 100 MG tablet Take 1 tablet (100 mg total) by mouth at bedtime. 03/31/19  Yes Jaffe, Adam R, DO  loratadine (CLARITIN) 10 MG tablet Take 1 tablet (10 mg total) by mouth daily. Patient not taking: Reported on 12/03/2019 10/12/19   Wieters, Madelynn Done C, PA-C  permethrin (ELIMITE) 5 % cream Apply to affected area once Patient not taking: Reported on 12/03/2019 10/12/19   Wieters, Hallie C, PA-C  esomeprazole (NEXIUM) 40 MG capsule Take 1 capsule (40 mg total) by mouth daily. Patient not taking: Reported on 09/16/2019 07/12/19 10/12/19  Vanessa Kick, MD    Allergies    [name] [camphor-menthol], Yellow dyes (non-tartrazine), and Latex  Review of Systems   Review of Systems  Constitutional: Negative for fever.  Eyes: Positive for photophobia and visual disturbance.  Respiratory: Negative for cough and shortness of breath.   Cardiovascular: Negative for chest pain.  Gastrointestinal: Positive for nausea and vomiting. Negative for abdominal pain.  Neurological: Positive for headaches. Negative for weakness and numbness.  All other systems reviewed and are negative.   Physical Exam Updated Vital Signs BP 130/90   Pulse (!) 56   Temp 98.2 F (36.8 C) (Oral)   Resp 16   SpO2 99%   Physical Exam Vitals and nursing note reviewed.  Constitutional:      Appearance: Normal appearance. She is well-developed.  HENT:     Head: Normocephalic and atraumatic.  Eyes:     General: Lids are normal.     Conjunctiva/sclera: Conjunctivae normal.     Pupils: Pupils are equal, round, and reactive to light.  Neck:     Comments: Neck is supple and without any rigidity. Cardiovascular:      Rate and Rhythm: Normal rate and regular rhythm.     Pulses: Normal pulses.     Heart sounds: Normal heart sounds. No murmur. No friction rub. No gallop.   Pulmonary:     Effort: Pulmonary effort is normal.     Breath sounds: Normal breath sounds.  Abdominal:     Palpations: Abdomen is soft. Abdomen is not rigid.     Tenderness:  There is no abdominal tenderness. There is no guarding.  Musculoskeletal:        General: Normal range of motion.     Cervical back: Full passive range of motion without pain and neck supple.  Skin:    General: Skin is warm and dry.     Capillary Refill: Capillary refill takes less than 2 seconds.  Neurological:     Mental Status: She is alert and oriented to person, place, and time.     Comments: Cranial nerves III-XII intact Follows commands, Moves all extremities  5/5 strength to BUE and BLE  Sensation intact throughout all major nerve distributions No pronator drift No slurred speech. No facial droop.   Psychiatric:        Speech: Speech normal.     ED Results / Procedures / Treatments   Labs (all labs ordered are listed, but only abnormal results are displayed) Labs Reviewed  I-STAT BETA HCG BLOOD, ED ([facility], [facility], [facility] ONLY)    EKG None  Radiology No results found.  Procedures Procedures (including critical care time)  Medications Ordered in ED Medications  sodium chloride 0.9 % bolus 1,000 mL (0 mLs Intravenous Stopped 12/03/19 1640)  metoCLOPramide (REGLAN) injection 10 mg (10 mg Intravenous Given 12/03/19 1534)  diphenhydrAMINE (BENADRYL) injection 25 mg (25 mg Intravenous Given 12/03/19 1535)  ondansetron (ZOFRAN) injection 4 mg (4 mg Intravenous Given 12/03/19 1533)  ketorolac (TORADOL) 30 MG/ML injection 30 mg (30 mg Intravenous Given 12/03/19 1649)  dexamethasone (DECADRON) injection 10 mg (10 mg Intravenous Given 12/03/19 1649)    ED Course  I have reviewed the triage vital signs and the nursing notes.  Pertinent labs & imaging  results that were available during my care of the patient were reviewed by me and considered in my medical decision making (see chart for details).    MDM Rules/Calculators/A&P                      29 year old female past medical severe migraines who presents for evaluation of headache that began yesterday.  Consistent with previous episodes of migraine.  Associated with nausea/vomiting, photophobia, blurry vision.  No fevers.  Initially arrival, she is afebrile, nontoxic-appearing.  Vital signs are stable.  No neuro deficits noted on exam.  Concern for migraine.  History/physical exam concerning for CVA, intracranial hemorrhage, dural venous thrombosis.  Plan for migraine cocktail.  Reevaluation.  Patient reports improvement after migraine cocktail. Headache has completely resolved. Vitals are stable.  She is able to tolerate p.o. without any difficulty.  Patient states she feels better. At this time, patient exhibits no emergent life-threatening condition that require further evaluation in ED or admission. Patient had ample opportunity for questions and discussion. All patient's questions were answered with full understanding. Strict return precautions discussed. Patient expresses understanding and agreement to plan.   Portions of this note were generated with Lobby[employer]. Dictation errors may occur despite best attempts at proofreading.  Final Clinical Impression(s) / ED Diagnoses Final diagnoses:  Other migraine without status migrainosus, not intractable    Rx / DC Orders ED Discharge Orders    None       Desma Mcgregor 12/03/19 1736    Isla Pence, MD 12/03/19 1740

## 2019-12-03 NOTE — Discharge Instructions (Signed)
Follow-up with your primary care doctor.  Return to emergency department for any vomiting, chest pain, difficulty breathing, difficulty moving arms or legs, fever or any other worsening concerning symptoms.

## 2019-12-17 ENCOUNTER — Emergency Department (HOSPITAL_COMMUNITY)
Admission: EM | Admit: 2019-12-17 | Discharge: 2019-12-17 | Disposition: A | Discharge status: Home / Self Care | Payer: Medicaid Other | Attending: Emergency Medicine | Admitting: Emergency Medicine

## 2019-12-17 ENCOUNTER — Other Ambulatory Visit: Payer: Self-pay

## 2019-12-17 ENCOUNTER — Encounter (HOSPITAL_COMMUNITY): Payer: Self-pay | Admitting: Emergency Medicine

## 2019-12-17 DIAGNOSIS — G43909 Migraine, unspecified, not intractable, without status migrainosus: Secondary | ICD-10-CM | POA: Insufficient documentation

## 2019-12-17 DIAGNOSIS — F17210 Nicotine dependence, cigarettes, uncomplicated: Secondary | ICD-10-CM | POA: Diagnosis not present

## 2019-12-17 DIAGNOSIS — J45909 Unspecified asthma, uncomplicated: Secondary | ICD-10-CM | POA: Insufficient documentation

## 2019-12-17 DIAGNOSIS — R519 Headache, unspecified: Secondary | ICD-10-CM | POA: Diagnosis present

## 2019-12-17 MED ORDER — METOCLOPRAMIDE HCL 5 MG/ML IJ SOLN
10.0000 mg | Freq: Once | INTRAMUSCULAR | Status: AC
  Administered 2019-12-17: 10 mg via INTRAVENOUS
  Filled 2019-12-17: qty 2

## 2019-12-17 MED ORDER — PROMETHAZINE HCL 25 MG RE SUPP: 25.0000 mg | Freq: Four times a day (QID) | RECTAL | 0 refills | Status: DC | PRN

## 2019-12-17 MED ORDER — KETOROLAC TROMETHAMINE 30 MG/ML IJ SOLN
30.0000 mg | Freq: Once | INTRAMUSCULAR | Status: AC
  Administered 2019-12-17: 14:00:00 30 mg via INTRAVENOUS
  Filled 2019-12-17: qty 1

## 2019-12-17 MED ORDER — DIPHENHYDRAMINE HCL 50 MG/ML IJ SOLN
25.0000 mg | Freq: Once | INTRAMUSCULAR | Status: AC
  Administered 2019-12-17: 14:00:00 25 mg via INTRAVENOUS
  Filled 2019-12-17: qty 1

## 2019-12-17 MED ORDER — DEXAMETHASONE SODIUM PHOSPHATE 10 MG/ML IJ SOLN
10.0000 mg | Freq: Once | INTRAMUSCULAR | Status: AC
  Administered 2019-12-17: 14:00:00 10 mg via INTRAVENOUS
  Filled 2019-12-17: qty 1

## 2019-12-17 MED ORDER — SODIUM CHLORIDE 0.9 % IV BOLUS
1000.0000 mL | Freq: Once | INTRAVENOUS | Status: AC
  Administered 2019-12-17: 14:00:00 1000 mL via INTRAVENOUS

## 2019-12-17 NOTE — ED Provider Notes (Signed)
[facility] EMERGENCY DEPARTMENT Provider Note   CSN: [id number] Arrival date & time: 12/17/19  1250     History Chief Complaint  Patient presents with  . Migraine    Michele Avila is a 29 y.o. female.  29 year old female with past medical history of migraines presents with complaint of migraine onset this morning.  Patient states that she was fatigued yesterday which is common prior to getting a migraine, onset of pain this morning upon waking associated with nausea and vomiting, denies fevers, chills, weakness, changes in vision, speech, gait.  Patient reports migraine similar to prior migraines, last migraine for severe was about 3 to 4 months ago.  Patient was in the emergency room about 2 weeks ago for a migraine, treated with medications and resolved.  Patient was taking Zofran ODT at home without improvement in her symptoms and unable to keep down her Imitrex.  Patient has been seen by neurology for migraines, patient questions if migraines are related to her Mirena IUD which she has had in place for 4 years which is about the time her migraines started.  No other complaints or concerns today.        Past Medical History:  Diagnosis Date  . Anemia   . Asthma    hx chronic bronchitis/exercise induced asthma as pre teen only  . Environmental allergies   . Infection    UTI  . Migraine   . Seasonal allergies   . Wrist fracture, right     Patient Active Problem List   Diagnosis Date Noted  . Acute pain of left knee 05/24/2017  . Pain in right wrist 05/24/2017  . Mirena intrauterine device in place 03/02/2016    Past Surgical History:  Procedure Laterality Date  . CESAREAN SECTION  2008  . CESAREAN SECTION N/A 06/15/2013   Procedure: CESAREAN SECTION;  Surgeon: Allyn Kenner, DO;  Location: [facility];  Service: Obstetrics;  Laterality: N/A;  . DILATION AND CURETTAGE OF UTERUS  08/2011  . FOOT SURGERY  2017  . INDUCED ABORTION       OB History    Gravida  5   Para  2   Term  2   Preterm      AB  3   Living  2     SAB      TAB  2   Ectopic      Multiple      Live Births  1           Family History  Problem Relation Age of Onset  . Cancer Father        liver  . Diabetes Father   . Hypertension Father   . Hypertension Mother   . Hypertension Maternal Grandmother   . Kidney disease Maternal Grandmother   . Diabetes Paternal Grandmother   . Hypertension Paternal Grandmother   . Ulcers Brother   . Crohn's disease Brother     Social History   Tobacco Use  . Smoking status: Current Every Day Smoker    Packs/day: 0.50    Types: Cigarettes  . Smokeless tobacco: Never Used  . Tobacco comment: down to 5 cigarrettes/day  Substance Use Topics  . Alcohol use: Not Currently    Comment: occassionally   . Drug use: Yes    Types: Marijuana    Comment: Last use 2 weeks ago late Aug 2020    Home Medications Prior to Admission medications   Medication Sig Start Date End  Date Taking? Authorizing Provider  albuterol (PROVENTIL HFA;VENTOLIN HFA) 108 (90 Base) MCG/ACT inhaler Inhale 1-2 puffs into the lungs every 6 (six) hours as needed for wheezing or shortness of breath. 12/12/18   Wardell Honour, MD  cyclobenzaprine (FLEXERIL) 10 MG tablet Take 1 tablet (10 mg total) by mouth 3 (three) times daily as needed for muscle spasms. 12/04/18   Orpah Greek, MD  Levonorgestrel (MIRENA, 52 MG, IU) 1 each by Intrauterine route once.     [provider]  loratadine (CLARITIN) 10 MG tablet Take 1 tablet (10 mg total) by mouth daily. Patient not taking: Reported on 12/03/2019 10/12/19   Wieters, Hallie C, PA-C  ondansetron (ZOFRAN ODT) 4 MG disintegrating tablet Take 1 tablet (4 mg total) by mouth every 8 (eight) hours as needed for nausea or vomiting. 03/31/19   Pieter Partridge, DO  permethrin (ELIMITE) 5 % cream Apply to affected area once Patient not taking: Reported on 12/03/2019 10/12/19   Wieters, Elesa Hacker, PA-C  promethazine (PHENERGAN) 25 MG suppository Place 1 suppository (25 mg total) rectally every 6 (six) hours as needed for nausea or vomiting. 12/17/19   Tacy Learn, PA-C  SUMAtriptan (IMITREX) 100 MG tablet TAKE 1 TABLET AT EARLIEST ONSET OF MIGRAINE. MAY REPEAT ONCE IN 2 HOURS IF HEADACHE PERSISTS OR RECURS. NO MORE THAN 2 TABLETS PER 24 HOURS Patient taking differently: Take 100 mg by mouth daily as needed for migraine or headache.  03/31/19   Tomi Likens, Adam R, DO  tiZANidine (ZANAFLEX) 4 MG tablet Take 1 tablet (4 mg total) by mouth every 6 (six) hours as needed for muscle spasms. 08/01/19   Tomi Likens, Adam R, DO  topiramate (TOPAMAX) 100 MG tablet Take 1 tablet (100 mg total) by mouth at bedtime. 03/31/19   Pieter Partridge, DO  esomeprazole (NEXIUM) 40 MG capsule Take 1 capsule (40 mg total) by mouth daily. Patient not taking: Reported on 09/16/2019 07/12/19 10/12/19  Vanessa Kick, MD    Allergies    [name] [camphor-menthol], Yellow dyes (non-tartrazine), and Latex  Review of Systems   Review of Systems  Constitutional: Negative for fever.  Eyes: Negative for visual disturbance.  Respiratory: Negative for shortness of breath.   Cardiovascular: Negative for chest pain.  Gastrointestinal: Positive for nausea and vomiting. Negative for abdominal pain, constipation and diarrhea.  Musculoskeletal: Negative for arthralgias, myalgias, neck pain and neck stiffness.  Skin: Negative for rash and wound.  Allergic/Immunologic: Negative for immunocompromised state.  Neurological: Positive for headaches. Negative for dizziness, weakness and numbness.  Hematological: Negative for adenopathy.  Psychiatric/Behavioral: Negative for confusion.  All other systems reviewed and are negative.   Physical Exam Updated Vital Signs BP 131/89 (BP Location: Right Arm)   Pulse 81   Temp 98.3 F (36.8 C) (Oral)   Resp 17   Ht 5\' 9"  (1.753 m)   Wt 104.3 kg   SpO2 100%   BMI 33.97 kg/m   Physical Exam  Vitals and nursing note reviewed.  Constitutional:      General: She is not in acute distress.    Appearance: She is well-developed. She is not diaphoretic.  HENT:     Head: Normocephalic and atraumatic.     Mouth/Throat:     Mouth: Mucous membranes are moist.  Eyes:     Extraocular Movements: Extraocular movements intact.     Pupils: Pupils are equal, round, and reactive to light.  Cardiovascular:     Rate and Rhythm: Normal rate  and regular rhythm.     Pulses: Normal pulses.     Heart sounds: Normal heart sounds.  Pulmonary:     Effort: Pulmonary effort is normal.     Breath sounds: Normal breath sounds.  Musculoskeletal:     Cervical back: Neck supple. No rigidity or tenderness.     Right lower leg: No edema.     Left lower leg: No edema.  Skin:    General: Skin is warm and dry.     Findings: No erythema or rash.  Neurological:     Mental Status: She is alert and oriented to person, place, and time.     GCS: GCS eye subscore is 4. GCS verbal subscore is 5. GCS motor subscore is 6.     Cranial Nerves: No cranial nerve deficit.     Sensory: No sensory deficit.     Motor: No weakness.     Coordination: Coordination normal.  Psychiatric:        Behavior: Behavior normal.     ED Results / Procedures / Treatments   Labs (all labs ordered are listed, but only abnormal results are displayed) Labs Reviewed - No data to display  EKG None  Radiology No results found.  Procedures Procedures (including critical care time)  Medications Ordered in ED Medications  ketorolac (TORADOL) 30 MG/ML injection 30 mg (30 mg Intravenous Given 12/17/19 1332)  diphenhydrAMINE (BENADRYL) injection 25 mg (25 mg Intravenous Given 12/17/19 1331)  dexamethasone (DECADRON) injection 10 mg (10 mg Intravenous Given 12/17/19 1333)  metoCLOPramide (REGLAN) injection 10 mg (10 mg Intravenous Given 12/17/19 1332)  sodium chloride 0.9 % bolus 1,000 mL (1,000 mLs Intravenous New Bag/Given 12/17/19  1330)    ED Course  I have reviewed the triage vital signs and the nursing notes.  Pertinent labs & imaging results that were available during my care of the patient were reviewed by me and considered in my medical decision making (see chart for details).  Clinical Course as of Dec 16 1500  Sun Dec 16, 7344  3945 29 year old female presents with complaint of migraine, similar to prior migraines, and relieved with home medications, unable to give medications down secondary to vomiting.  Exam is unremarkable.  Patient did have a CT of her head in June 2020 which was normal.  Plan is to administer migraine cocktail and reassess.   [LM]  1501 Patient is feeling better after Toradol, Benadryl, Reglan, Decadron and IV fluids.  Plan is for p.o. challenge, if patient is tolerating p.o. she may be discharged.  Will give prescription for Phenergan suppositories, instructed on use with plan to follow-up with PCP or see her neurologist.   [LM]    Clinical Course User Index [LM] Roque Lias   MDM Rules/Calculators/A&P                      Final Clinical Impression(s) / ED Diagnoses Final diagnoses:  Migraine without status migrainosus, not intractable, unspecified migraine type    Rx / DC Orders ED Discharge Orders         Ordered    promethazine (PHENERGAN) 25 MG suppository  Every 6 hours PRN     12/17/19 1458           Tacy Learn, PA-C 12/17/19 1502    Isla Pence, MD 12/17/19 [phone]

## 2019-12-17 NOTE — ED Triage Notes (Signed)
Patient c/o migraine onset of this am. Took home medication but unable to keep down. multiple episodes of emesis today.

## 2019-12-17 NOTE — Discharge Instructions (Signed)
Use Phenergan as needed as prescribed for vomiting. Home to rest, push plenty of fluids. Turn to fever, worsening pain or other concerns otherwise follow-up with your PCP or neurologist.

## 2020-01-08 ENCOUNTER — Encounter: Payer: Self-pay | Admitting: Neurology

## 2020-01-16 ENCOUNTER — Encounter (HOSPITAL_COMMUNITY): Payer: Self-pay

## 2020-01-16 ENCOUNTER — Ambulatory Visit (HOSPITAL_COMMUNITY)
Admission: EM | Admit: 2020-01-16 | Discharge: 2020-01-16 | Disposition: A | Discharge status: Home / Self Care | Payer: Medicaid Other | Attending: Family Medicine | Admitting: Family Medicine

## 2020-01-16 ENCOUNTER — Other Ambulatory Visit: Payer: Self-pay

## 2020-01-16 DIAGNOSIS — R21 Rash and other nonspecific skin eruption: Secondary | ICD-10-CM | POA: Diagnosis not present

## 2020-01-16 MED ORDER — PREDNISONE 10 MG (21) PO TBPK: ORAL_TABLET | Freq: Every day | ORAL | 0 refills | Status: DC

## 2020-01-16 NOTE — ED Triage Notes (Signed)
Pt is here with a rash all over her arms/stomach that started 3 days ago. Its itchy, states it could be an allergic reaction too.

## 2020-01-16 NOTE — ED Provider Notes (Signed)
[facility]   [id number] 01/16/20 Arrival Time: [id number]  ASSESSMENT & PLAN:  1. Rash and nonspecific skin eruption     May continue Benadryl.  Begin: Meds ordered this encounter  Medications  . predniSONE (STERAPRED UNI-PAK 21 TAB) 10 MG (21) TBPK tablet    Sig: Take by mouth daily. Take as directed.    Dispense:  21 tablet    Refill:  0    Will follow up with PCP or here if worsening or failing to improve as anticipated. Reviewed expectations re: course of current medical issues. Questions answered. Outlined signs and symptoms indicating need for more acute intervention. Patient verbalized understanding. After Visit Summary given.   SUBJECTIVE:  Michele Avila is a 29 y.o. female who presents with a skin complaint.   Location: from neck down Onset: abrupt Duration: first noted approx 3 d ago Associated pruritis? significant Associated pain? none Progression: stable  Drainage? none Known trigger? No  Environmental exposures? No  Contacts with similar? No  Recent travel? No  Other associated symptoms: none Therapies tried thus far: Benadryl without much relief Arthralgia or myalgia? none Recent illness? none Fever? none New medications? none No specific aggravating or alleviating factors reported.   OBJECTIVE: Vitals:   01/16/20 1402 01/16/20 1405  BP:  121/87  Pulse:  76  Resp:  18  Temp:  98.8 F (37.1 C)  TempSrc:  Oral  SpO2:  99%  Weight: 108.4 kg     General appearance: alert; no distress HEENT: [place]; AT Neck: supple with FROM Lungs: clear to auscultation bilaterally Heart: regular rate and rhythm Extremities: no edema; moves all extremities normally Skin: warm and dry; signs of infection: no; generalized, scattered maculopapular rash from neck down Psychological: alert and cooperative; normal mood and affect  Allergies  Allergen Reactions  . [name] [Camphor-Menthol] Swelling    SEVERE FACIAL SWELLING  . Yellow Dyes  (Non-Tartrazine)     migraines  . Latex Hives and Rash    Past Medical History:  Diagnosis Date  . Anemia   . Asthma    hx chronic bronchitis/exercise induced asthma as pre teen only  . Environmental allergies   . Infection    UTI  . Migraine   . Seasonal allergies   . Wrist fracture, right    Social History   Socioeconomic History  . Marital status: Significant Other    Spouse name: Not on file  . Number of children: 1  . Years of education: Not on file  . Highest education level: Some college, no degree  Occupational History    Employer: Materials engineer: [employer]  Tobacco Use  . Smoking status: Current Every Day Smoker    Packs/day: 0.50    Types: Cigarettes  . Smokeless tobacco: Never Used  . Tobacco comment: down to 5 cigarrettes/day  Substance and Sexual Activity  . Alcohol use: Not Currently    Comment: occassionally   . Drug use: Yes    Types: Marijuana    Comment: Last use 2 weeks ago late Aug 2020  . Sexual activity: Yes    Birth control/protection: I.U.D.  Other Topics Concern  . Not on file  Social History Narrative   Patient is right-handed. She lives with her boyfriend and son in a 2 story house. She drinks one beverage with caffeine 2x week. She is acitive at work, loading and unloading trucks.   Social Determinants of Health   Financial Resource Strain:   .  Difficulty of Paying Living Expenses: Not on file  Food Insecurity:   . Worried About Charity fundraiser in the Last Year: Not on file  . Ran Out of Food in the Last Year: Not on file  Transportation Needs:   . Lack of Transportation (Medical): Not on file  . Lack of Transportation (Non-Medical): Not on file  Physical Activity:   . Days of Exercise per Week: Not on file  . Minutes of Exercise per Session: Not on file  Stress:   . Feeling of Stress : Not on file  Social Connections:   . Frequency of Communication with Friends and Family: Not on file  . Frequency of Social  Gatherings with Friends and Family: Not on file  . Attends Religious Services: Not on file  . Active Member of Clubs or Organizations: Not on file  . Attends Archivist Meetings: Not on file  . Marital Status: Not on file  Intimate Partner Violence:   . Fear of Current or Ex-Partner: Not on file  . Emotionally Abused: Not on file  . Physically Abused: Not on file  . Sexually Abused: Not on file   Family History  Problem Relation Age of Onset  . Cancer Father        liver  . Diabetes Father   . Hypertension Father   . Hypertension Mother   . Hypertension Maternal Grandmother   . Kidney disease Maternal Grandmother   . Diabetes Paternal Grandmother   . Hypertension Paternal Grandmother   . Ulcers Brother   . Crohn's disease Brother    Past Surgical History:  Procedure Laterality Date  . CESAREAN SECTION  2008  . CESAREAN SECTION N/A 06/15/2013   Procedure: CESAREAN SECTION;  Surgeon: Allyn Kenner, DO;  Location: [facility];  Service: Obstetrics;  Laterality: N/A;  . DILATION AND CURETTAGE OF UTERUS  08/2011  . FOOT SURGERY  2017  . INDUCED ABORTION       Vanessa Kick, MD 01/16/20 [phone]

## 2020-01-26 DIAGNOSIS — Z20828 Contact with and (suspected) exposure to other viral communicable diseases: Secondary | ICD-10-CM | POA: Diagnosis not present

## 2020-01-26 DIAGNOSIS — Z7189 Other specified counseling: Secondary | ICD-10-CM | POA: Diagnosis not present

## 2020-01-26 DIAGNOSIS — R05 Cough: Secondary | ICD-10-CM | POA: Diagnosis not present

## 2020-01-30 ENCOUNTER — Ambulatory Visit: Payer: Medicaid Other | Admitting: Neurology
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
## 2020-02-02 ENCOUNTER — Telehealth: Payer: Self-pay | Admitting: Neurology

## 2020-02-02 ENCOUNTER — Other Ambulatory Visit: Payer: Self-pay

## 2020-02-02 DIAGNOSIS — Z20828 Contact with and (suspected) exposure to other viral communicable diseases: Secondary | ICD-10-CM | POA: Diagnosis not present

## 2020-02-02 DIAGNOSIS — Z03818 Encounter for observation for suspected exposure to other biological agents ruled out: Secondary | ICD-10-CM | POA: Diagnosis not present

## 2020-02-02 DIAGNOSIS — U071 COVID-19: Secondary | ICD-10-CM | POA: Diagnosis not present

## 2020-02-02 MED ORDER — SUMATRIPTAN SUCCINATE 100 MG PO TABS: 100.0000 mg | ORAL_TABLET | Freq: Every day | ORAL | 0 refills | Status: DC | PRN

## 2020-02-02 MED ORDER — TOPIRAMATE 100 MG PO TABS: 100.0000 mg | ORAL_TABLET | Freq: Every day | ORAL | 3 refills | Status: DC

## 2020-02-02 NOTE — Telephone Encounter (Signed)
Patient called in to get a refill on her Topiramate and Sumatriptan medication as well as a follow up. She said she may have another refill until her June appointment. She was not sure. [facility] [address]. Thank you

## 2020-02-03 DIAGNOSIS — U071 COVID-19: Secondary | ICD-10-CM | POA: Diagnosis not present

## 2020-02-03 DIAGNOSIS — R05 Cough: Secondary | ICD-10-CM | POA: Diagnosis not present

## 2020-02-03 DIAGNOSIS — Z7189 Other specified counseling: Secondary | ICD-10-CM | POA: Diagnosis not present

## 2020-02-03 DIAGNOSIS — R519 Headache, unspecified: Secondary | ICD-10-CM | POA: Diagnosis not present

## 2020-03-01 ENCOUNTER — Other Ambulatory Visit: Payer: Self-pay

## 2020-03-01 ENCOUNTER — Telehealth: Payer: Self-pay | Admitting: Neurology

## 2020-03-01 MED ORDER — SUMATRIPTAN SUCCINATE 100 MG PO TABS: 100.0000 mg | ORAL_TABLET | Freq: Every day | ORAL | 1 refills | Status: DC | PRN

## 2020-03-01 NOTE — Telephone Encounter (Signed)
After reviewing the chart rx sent over 03/01/20 at 2:00. Pharmacy confirmation at 2:10pm

## 2020-03-01 NOTE — Telephone Encounter (Signed)
Pt request call in sumatriptan to [facility]. Pt ph [phone].  Pt states left it at hotel and they threw it away.

## 2020-03-01 NOTE — Telephone Encounter (Signed)
The following message was left with [facility] on 03/01/20 at 12:34 PM.  Caller has lost her medication, Sumatriptan 100 MG, and is asking if she can get it called into [facility] on [address] in [place].

## 2020-04-02 ENCOUNTER — Other Ambulatory Visit: Payer: Self-pay

## 2020-04-02 ENCOUNTER — Ambulatory Visit (HOSPITAL_COMMUNITY)
Admission: EM | Admit: 2020-04-02 | Discharge: 2020-04-02 | Disposition: A | Discharge status: Home / Self Care | Payer: Medicaid Other | Attending: Family Medicine | Admitting: Family Medicine

## 2020-04-02 ENCOUNTER — Encounter (HOSPITAL_COMMUNITY): Payer: Self-pay

## 2020-04-02 DIAGNOSIS — J069 Acute upper respiratory infection, unspecified: Secondary | ICD-10-CM | POA: Diagnosis not present

## 2020-04-02 DIAGNOSIS — J029 Acute pharyngitis, unspecified: Secondary | ICD-10-CM | POA: Diagnosis present

## 2020-04-02 DIAGNOSIS — Z79899 Other long term (current) drug therapy: Secondary | ICD-10-CM | POA: Diagnosis not present

## 2020-04-02 DIAGNOSIS — Z20822 Contact with and (suspected) exposure to covid-19: Secondary | ICD-10-CM | POA: Diagnosis not present

## 2020-04-02 DIAGNOSIS — J449 Chronic obstructive pulmonary disease, unspecified: Secondary | ICD-10-CM | POA: Insufficient documentation

## 2020-04-02 DIAGNOSIS — Z8249 Family history of ischemic heart disease and other diseases of the circulatory system: Secondary | ICD-10-CM | POA: Diagnosis not present

## 2020-04-02 DIAGNOSIS — Z793 Long term (current) use of hormonal contraceptives: Secondary | ICD-10-CM | POA: Diagnosis not present

## 2020-04-02 DIAGNOSIS — F17210 Nicotine dependence, cigarettes, uncomplicated: Secondary | ICD-10-CM | POA: Insufficient documentation

## 2020-04-02 DIAGNOSIS — R05 Cough: Secondary | ICD-10-CM | POA: Insufficient documentation

## 2020-04-02 LAB — SARS CORONAVIRUS 2 (TAT 6-24 HRS): SARS Coronavirus 2: NEGATIVE

## 2020-04-02 MED ORDER — ALBUTEROL SULFATE HFA 108 (90 BASE) MCG/ACT IN AERS: 1.0000 | INHALATION_SPRAY | Freq: Four times a day (QID) | RESPIRATORY_TRACT | 0 refills | Status: DC | PRN

## 2020-04-02 MED ORDER — PSEUDOEPH-BROMPHEN-DM 30-2-10 MG/5ML PO SYRP: 5.0000 mL | ORAL_SOLUTION | Freq: Four times a day (QID) | ORAL | 0 refills | Status: DC | PRN

## 2020-04-02 MED ORDER — FLUTICASONE PROPIONATE 50 MCG/ACT NA SUSP: 1.0000 | Freq: Every day | NASAL | 0 refills | Status: DC

## 2020-04-02 MED ORDER — IBUPROFEN 600 MG PO TABS: 600.0000 mg | ORAL_TABLET | Freq: Four times a day (QID) | ORAL | 0 refills | Status: DC | PRN

## 2020-04-02 NOTE — ED Triage Notes (Signed)
Patient reports cough, fatigue, and sore throat starting this morning.

## 2020-04-02 NOTE — Discharge Instructions (Signed)
Covid test pending, monitor my chart for results Begin Flonase nasal spray 1 to 2 spray in each nostril daily, may supplement with Zyrtec/Claritin or over-the-counter Mucinex Cough syrup as needed for cough every 8 hours Albuterol inhaler as needed Tylenol and ibuprofen for any headaches, body aches and sore throat Rest and drink plenty of fluids  Please follow-up if symptoms not improving or worsening

## 2020-04-03 NOTE — ED Provider Notes (Signed)
[facility]    CSN: [id number] Arrival date & time: 04/02/20  1426      History   Chief Complaint Chief Complaint  Patient presents with  . Cough  . Sore Throat  . Fatigue    HPI Michele Avila is a 29 y.o. female history of asthma, tobacco use, presenting today for evaluation of cough, sore throat and fatigue.  Symptoms began today when she woke up.  Reports that her significant other has had similar symptoms over the past few days.  Reports child sick last week with fevers.  Denies any known exposure to Covid.  Denies GI symptoms.  Requesting inhaler as she frequently develops bronchitis.  No shortness of breath or chest pain at this time.  HPI  Past Medical History:  Diagnosis Date  . Anemia   . Asthma    hx chronic bronchitis/exercise induced asthma as pre teen only  . Environmental allergies   . Infection    UTI  . Migraine   . Seasonal allergies   . Wrist fracture, right     Patient Active Problem List   Diagnosis Date Noted  . Acute pain of left knee 05/24/2017  . Pain in right wrist 05/24/2017  . Mirena intrauterine device in place 03/02/2016    Past Surgical History:  Procedure Laterality Date  . CESAREAN SECTION  2008  . CESAREAN SECTION N/A 06/15/2013   Procedure: CESAREAN SECTION;  Surgeon: Allyn Kenner, DO;  Location: [facility];  Service: Obstetrics;  Laterality: N/A;  . DILATION AND CURETTAGE OF UTERUS  08/2011  . FOOT SURGERY  2017  . INDUCED ABORTION      OB History    Gravida  5   Para  2   Term  2   Preterm      AB  3   Living  2     SAB      TAB  2   Ectopic      Multiple      Live Births  1            Home Medications    Prior to Admission medications   Medication Sig Start Date End Date Taking? Authorizing Provider  albuterol (VENTOLIN HFA) 108 (90 Base) MCG/ACT inhaler Inhale 1-2 puffs into the lungs every 6 (six) hours as needed for wheezing or shortness of breath. 04/02/20   Chaos Carlile C, PA-C    brompheniramine-pseudoephedrine-DM 30-2-10 MG/5ML syrup Take 5 mLs by mouth 4 (four) times daily as needed. 04/02/20   Koah Chisenhall C, PA-C  cyclobenzaprine (FLEXERIL) 10 MG tablet Take 1 tablet (10 mg total) by mouth 3 (three) times daily as needed for muscle spasms. 12/04/18   Orpah Greek, MD  fluticasone (FLONASE) 50 MCG/ACT nasal spray Place 1-2 sprays into both nostrils daily for 7 days. 04/02/20 04/09/20  Araminta Zorn C, PA-C  ibuprofen (ADVIL) 600 MG tablet Take 1 tablet (600 mg total) by mouth every 6 (six) hours as needed. 04/02/20   Braedin Millhouse C, PA-C  Levonorgestrel (MIRENA, 52 MG, IU) 1 each by Intrauterine route once.     [provider]  ondansetron (ZOFRAN ODT) 4 MG disintegrating tablet Take 1 tablet (4 mg total) by mouth every 8 (eight) hours as needed for nausea or vomiting. 03/31/19   Tomi Likens, Adam R, DO  predniSONE (STERAPRED UNI-PAK 21 TAB) 10 MG (21) TBPK tablet Take by mouth daily. Take as directed. 01/16/20   Vanessa Kick, MD  promethazine (PHENERGAN) 25  MG suppository Place 1 suppository (25 mg total) rectally every 6 (six) hours as needed for nausea or vomiting. 12/17/19   Tacy Learn, PA-C  SUMAtriptan (IMITREX) 100 MG tablet Take 1 tablet (100 mg total) by mouth daily as needed for migraine or headache. 03/01/20   Tomi Likens, Adam R, DO  tiZANidine (ZANAFLEX) 4 MG tablet Take 1 tablet (4 mg total) by mouth every 6 (six) hours as needed for muscle spasms. 08/01/19   Tomi Likens, Adam R, DO  topiramate (TOPAMAX) 100 MG tablet Take 1 tablet (100 mg total) by mouth at bedtime. 02/02/20   Pieter Partridge, DO  esomeprazole (NEXIUM) 40 MG capsule Take 1 capsule (40 mg total) by mouth daily. Patient not taking: Reported on 09/16/2019 07/12/19 10/12/19  Vanessa Kick, MD  loratadine (CLARITIN) 10 MG tablet Take 1 tablet (10 mg total) by mouth daily. Patient not taking: Reported on 12/03/2019 10/12/19 01/16/20  Janith Lima, PA-C    Family History Family History   Problem Relation Age of Onset  . Cancer Father        liver  . Diabetes Father   . Hypertension Father   . Hypertension Mother   . Hypertension Maternal Grandmother   . Kidney disease Maternal Grandmother   . Diabetes Paternal Grandmother   . Hypertension Paternal Grandmother   . Ulcers Brother   . Crohn's disease Brother     Social History Social History   Tobacco Use  . Smoking status: Current Every Day Smoker    Packs/day: 0.50    Types: Cigarettes  . Smokeless tobacco: Never Used  . Tobacco comment: down to 5 cigarrettes/day  Substance Use Topics  . Alcohol use: Not Currently    Comment: occassionally   . Drug use: Yes    Types: Marijuana    Comment: Last use 2 weeks ago late Aug 2020     Allergies   [name] [camphor-menthol], Yellow dyes (non-tartrazine), and Latex   Review of Systems Review of Systems  Constitutional: Positive for fatigue. Negative for activity change, appetite change, chills and fever.  HENT: Positive for congestion, rhinorrhea and sore throat. Negative for ear pain, sinus pressure and trouble swallowing.   Eyes: Negative for discharge and redness.  Respiratory: Positive for cough. Negative for chest tightness and shortness of breath.   Cardiovascular: Negative for chest pain.  Gastrointestinal: Negative for abdominal pain, diarrhea, nausea and vomiting.  Musculoskeletal: Negative for myalgias.  Skin: Negative for rash.  Neurological: Negative for dizziness, light-headedness and headaches.     Physical Exam Triage Vital Signs ED Triage Vitals  Enc Vitals Group     BP 04/02/20 1500 123/70     Pulse Rate 04/02/20 1500 67     Resp 04/02/20 1500 20     Temp 04/02/20 1500 98.6 F (37 C)     Temp Source 04/02/20 1500 Oral     SpO2 04/02/20 1500 99 %     Weight --      Height --      Head Circumference --      Peak Flow --      Pain Score 04/02/20 1459 5     Pain Loc --      Pain Edu? --      Excl. in [place]? --    No data  found.  Updated Vital Signs BP 123/70 (BP Location: Right Arm)   Pulse 67   Temp 98.6 F (37 C) (Oral)   Resp 20   SpO2  99%   Visual Acuity Right Eye Distance:   Left Eye Distance:   Bilateral Distance:    Right Eye Near:   Left Eye Near:    Bilateral Near:     Physical Exam Vitals and nursing note reviewed.  Constitutional:      Appearance: She is well-developed.     Comments: No acute distress  HENT:     Head: Normocephalic and atraumatic.     Ears:     Comments: Bilateral ears without tenderness to palpation of external auricle, tragus and mastoid, EAC's without erythema or swelling, TM's with good bony landmarks and cone of light. Non erythematous.     Nose: Nose normal.     Comments: Nasal mucosa erythematous, swollen turbinates bilaterally    Mouth/Throat:     Comments: Oral mucosa pink and moist, no tonsillar enlargement or exudate. Posterior pharynx patent and nonerythematous, no uvula deviation or swelling. Normal phonation.  Eyes:     Conjunctiva/sclera: Conjunctivae normal.  Cardiovascular:     Rate and Rhythm: Normal rate.  Pulmonary:     Effort: Pulmonary effort is normal. No respiratory distress.     Comments: Breathing comfortably at rest, CTABL, no wheezing, rales or other adventitious sounds auscultated  Abdominal:     General: There is no distension.  Musculoskeletal:        General: Normal range of motion.     Cervical back: Neck supple.  Skin:    General: Skin is warm and dry.  Neurological:     Mental Status: She is alert and oriented to person, place, and time.      UC Treatments / Results  Labs (all labs ordered are listed, but only abnormal results are displayed) Labs Reviewed  SARS CORONAVIRUS 2 (TAT 6-24 HRS)    EKG   Radiology No results found.  Procedures Procedures (including critical care time)  Medications Ordered in UC Medications - No data to display  Initial Impression / Assessment and Plan / UC Course  I  have reviewed the triage vital signs and the nursing notes.  Pertinent labs & imaging results that were available during my care of the patient were reviewed by me and considered in my medical decision making (see chart for details).     1 day of URI symptoms.  Most likely viral etiology.  Recommending symptomatic and supportive care.  Strep test negative, Covid pending.  Rest and fluids.  Discussed strict return precautions. Patient verbalized understanding and is agreeable with plan.  Final Clinical Impressions(s) / UC Diagnoses   Final diagnoses:  Viral URI with cough     Discharge Instructions     Covid test pending, monitor my chart for results Begin Flonase nasal spray 1 to 2 spray in each nostril daily, may supplement with Zyrtec/Claritin or over-the-counter Mucinex Cough syrup as needed for cough every 8 hours Albuterol inhaler as needed Tylenol and ibuprofen for any headaches, body aches and sore throat Rest and drink plenty of fluids  Please follow-up if symptoms not improving or worsening   ED Prescriptions    Medication Sig Dispense Auth. Provider   fluticasone (FLONASE) 50 MCG/ACT nasal spray Place 1-2 sprays into both nostrils daily for 7 days. 1 g Chason Mciver C, PA-C   brompheniramine-pseudoephedrine-DM 30-2-10 MG/5ML syrup Take 5 mLs by mouth 4 (four) times daily as needed. 120 mL Bartt Gonzaga C, PA-C   ibuprofen (ADVIL) 600 MG tablet Take 1 tablet (600 mg total) by mouth every 6 (six) hours as  needed. 30 tablet Molley Houser C, PA-C   albuterol (VENTOLIN HFA) 108 (90 Base) MCG/ACT inhaler Inhale 1-2 puffs into the lungs every 6 (six) hours as needed for wheezing or shortness of breath. 18 g Caryssa Elzey, [place] C, PA-C     PDMP not reviewed this encounter.   Janith Lima, [place] 04/03/20 [phone]

## 2020-04-04 ENCOUNTER — Ambulatory Visit (INDEPENDENT_AMBULATORY_CARE_PROVIDER_SITE_OTHER)
Admission: RE | Admit: 2020-04-04 | Discharge: 2020-04-04 | Disposition: A | Discharge status: Home / Self Care | Payer: Medicaid Other | Source: Ambulatory Visit

## 2020-04-04 DIAGNOSIS — R05 Cough: Secondary | ICD-10-CM

## 2020-04-04 DIAGNOSIS — R509 Fever, unspecified: Secondary | ICD-10-CM | POA: Diagnosis not present

## 2020-04-04 DIAGNOSIS — R0602 Shortness of breath: Secondary | ICD-10-CM

## 2020-04-04 DIAGNOSIS — R5383 Other fatigue: Secondary | ICD-10-CM

## 2020-04-04 DIAGNOSIS — R059 Cough, unspecified: Secondary | ICD-10-CM

## 2020-04-04 MED ORDER — PREDNISONE 10 MG (21) PO TBPK: ORAL_TABLET | Freq: Every day | ORAL | 0 refills | Status: AC

## 2020-04-04 NOTE — Discharge Instructions (Addendum)
I have sent in a steroid taper for you to take starting today. Take as directed on the package.   If you are not feeling better over the next day or two, follow up in person either with urgent care or primary care.   Go to the ER for trouble swallowing, trouble breathing, other concerning symptoms.

## 2020-04-04 NOTE — ED Provider Notes (Signed)
Michele Avila  Virtual Visit via Video Note:  Michele Avila  initiated request for Telemedicine visit with [facility] team. I connected with Michele Avila  on 04/04/2020 at 8:31 AM  for a synchronized telemedicine visit using a video enabled HIPPA compliant telemedicine application. I verified that I am speaking with Michele Avila  using two identifiers. Michele Mare, NP  was physically located in a [facility] Urgent care site and Michele Avila was located at a different location.   The limitations of evaluation and management by telemedicine as well as the availability of in-person appointments were discussed. Patient was informed that she  may incur a bill ( including co-pay) for this virtual visit encounter. Michele Avila  expressed understanding and gave verbal consent to proceed with virtual visit.   Michele Avila 04/04/20 Arrival Time: [name]  CC: COUGH  SUBJECTIVE: History from: patient.  Michele Avila is a 29 y.o. female who presents with abrupt onset of cough and fever for 4 day. She was seen in the [facility] for this on 04/02/20 and was prescribed Bromfed, albuterol inhaler, flonase and ibuprofen. Denies sick exposure to Covid, strep, flu or mono, or precipitating event.  Has tried albuterol without relief.  Symptoms are made worse with activity, but tolerating liquids and own secretions without difficulty.  Denies previous symptoms in the past.     Denies  ear pain, sinus pain, rhinorrhea, nasal congestion, wheezing, chest pain, nausea, rash, changes in bowel or bladder habits.      ROS: As per HPI.  All other pertinent ROS negative.     Past Medical History:  Diagnosis Date  . Anemia   . Asthma    hx chronic bronchitis/exercise induced asthma as pre teen only  . Environmental allergies   . Infection    UTI  . Migraine   . Seasonal allergies   . Wrist fracture, right    Past Surgical History:  Procedure Laterality Date  .  CESAREAN SECTION  2008  . CESAREAN SECTION N/A 06/15/2013   Procedure: CESAREAN SECTION;  Surgeon: Allyn Kenner, DO;  Location: [facility];  Service: Obstetrics;  Laterality: N/A;  . DILATION AND CURETTAGE OF UTERUS  08/2011  . FOOT SURGERY  2017  . INDUCED ABORTION     Allergies  Allergen Reactions  . [name] [Camphor-Menthol] Swelling    SEVERE FACIAL SWELLING  . Yellow Dyes (Non-Tartrazine)     migraines  . Latex Hives and Rash   No current facility-administered medications on file prior to encounter.   Current Outpatient Medications on File Prior to Encounter  Medication Sig Dispense Refill  . albuterol (VENTOLIN HFA) 108 (90 Base) MCG/ACT inhaler Inhale 1-2 puffs into the lungs every 6 (six) hours as needed for wheezing or shortness of breath. 18 g 0  . brompheniramine-pseudoephedrine-DM 30-2-10 MG/5ML syrup Take 5 mLs by mouth 4 (four) times daily as needed. 120 mL 0  . cyclobenzaprine (FLEXERIL) 10 MG tablet Take 1 tablet (10 mg total) by mouth 3 (three) times daily as needed for muscle spasms. 20 tablet 0  . fluticasone (FLONASE) 50 MCG/ACT nasal spray Place 1-2 sprays into both nostrils daily for 7 days. 1 g 0  . ibuprofen (ADVIL) 600 MG tablet Take 1 tablet (600 mg total) by mouth every 6 (six) hours as needed. 30 tablet 0  . Levonorgestrel (MIRENA, 52 MG, IU) 1 each by Intrauterine route once.     . ondansetron (ZOFRAN ODT) 4 MG disintegrating tablet  Take 1 tablet (4 mg total) by mouth every 8 (eight) hours as needed for nausea or vomiting. 20 tablet 3  . promethazine (PHENERGAN) 25 MG suppository Place 1 suppository (25 mg total) rectally every 6 (six) hours as needed for nausea or vomiting. 12 each 0  . SUMAtriptan (IMITREX) 100 MG tablet Take 1 tablet (100 mg total) by mouth daily as needed for migraine or headache. 10 tablet 1  . tiZANidine (ZANAFLEX) 4 MG tablet Take 1 tablet (4 mg total) by mouth every 6 (six) hours as needed for muscle spasms. 30 tablet 3  . topiramate  (TOPAMAX) 100 MG tablet Take 1 tablet (100 mg total) by mouth at bedtime. 30 tablet 3  . [DISCONTINUED] esomeprazole (NEXIUM) 40 MG capsule Take 1 capsule (40 mg total) by mouth daily. (Patient not taking: Reported on 09/16/2019) 20 capsule 0  . [DISCONTINUED] loratadine (CLARITIN) 10 MG tablet Take 1 tablet (10 mg total) by mouth daily. (Patient not taking: Reported on 12/03/2019) 12 tablet 0   Social History   Socioeconomic History  . Marital status: Significant Other    Spouse name: Not on file  . Number of children: 1  . Years of education: Not on file  . Highest education level: Some college, no degree  Occupational History    Employer: Materials engineer: [employer]  Tobacco Use  . Smoking status: Current Every Day Smoker    Packs/day: 0.50    Types: Cigarettes  . Smokeless tobacco: Never Used  . Tobacco comment: down to 5 cigarrettes/day  Substance and Sexual Activity  . Alcohol use: Not Currently    Comment: occassionally   . Drug use: Yes    Types: Marijuana    Comment: Last use 2 weeks ago late Aug 2020  . Sexual activity: Yes    Birth control/protection: I.U.D.  Other Topics Concern  . Not on file  Social History Narrative   Patient is right-handed. She lives with her boyfriend and son in a 2 story house. She drinks one beverage with caffeine 2x week. She is acitive at work, loading and unloading trucks.   Social Determinants of Health   Financial Resource Strain:   . Difficulty of Paying Living Expenses:   Food Insecurity:   . Worried About Charity fundraiser in the Last Year:   . Arboriculturist in the Last Year:   Transportation Needs:   . Film/video editor (Medical):   Marland Kitchen Lack of Transportation (Non-Medical):   Physical Activity:   . Days of Exercise per Week:   . Minutes of Exercise per Session:   Stress:   . Feeling of Stress :   Social Connections:   . Frequency of Communication with Friends and Family:   . Frequency of Social Gatherings with  Friends and Family:   . Attends Religious Services:   . Active Member of Clubs or Organizations:   . Attends Archivist Meetings:   Marland Kitchen Marital Status:   Intimate Partner Violence:   . Fear of Current or Ex-Partner:   . Emotionally Abused:   Marland Kitchen Physically Abused:   . Sexually Abused:    Family History  Problem Relation Age of Onset  . Cancer Father        liver  . Diabetes Father   . Hypertension Father   . Hypertension Mother   . Hypertension Maternal Grandmother   . Kidney disease Maternal Grandmother   . Diabetes Paternal Grandmother   .  Hypertension Paternal Grandmother   . Ulcers Brother   . Crohn's disease Brother     OBJECTIVE:   There were no vitals filed for this visit.  General appearance: alert; no distress Eyes: EOMI grossly HENT: normocephalic; atraumatic Neck: supple with FROM Lungs: normal respiratory effort; speaking in full sentences without difficulty Extremities: moves extremities without difficulty Skin: No obvious rashes Neurologic: No facial asymmetries Psychological: alert and cooperative; normal mood and affect  ASSESSMENT & PLAN:  1. Cough   2. Fever, unspecified fever cause     Meds ordered this encounter  Medications  . predniSONE (STERAPRED UNI-PAK 21 TAB) 10 MG (21) TBPK tablet    Sig: Take by mouth daily for 6 days. Take 6 tablets on day 1, 5 tablets on day 2, 4 tablets on day 3, 3 tablets on day 4, 2 tablets on day 5, 1 tablet on day 6    Dispense:  21 tablet    Refill:  0    Order Specific Question:   Supervising Provider    Answer:   Chase Picket [id number]      Get plenty of rest and push fluids Prescribed prednisone taper Continue with home medication regimen Drink warm or cool liquids, use throat lozenges, or popsicles to help alleviate symptoms Take OTC ibuprofen or tylenol as needed for pain Follow up with PCP if symptoms persists Return or go to ER if patient has any new or worsening symptoms such as  fever, chills, nausea, vomiting, worsening sore throat, cough, abdominal pain, chest pain, changes in bowel or bladder habits, etc...  I discussed the assessment and treatment plan with the patient. The patient was provided an opportunity to ask questions and all were answered. The patient agreed with the plan and demonstrated an understanding of the instructions.   The patient was advised to call back or seek an in-person evaluation if the symptoms worsen or if the condition fails to improve as anticipated.  I provided 10 minutes of non-face-to-face time during this encounter.  Michele Mare, NP  04/04/2020 8:31 AM         Faustino Congress, NP 04/04/20 [phone]
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
## 2020-04-19 ENCOUNTER — Encounter: Payer: Self-pay | Admitting: Neurology

## 2020-04-19 ENCOUNTER — Other Ambulatory Visit: Payer: Self-pay

## 2020-04-19 ENCOUNTER — Telehealth (INDEPENDENT_AMBULATORY_CARE_PROVIDER_SITE_OTHER): Payer: Medicaid Other | Admitting: Neurology

## 2020-04-19 VITALS — Ht 69.0 in | Wt 230.0 lb

## 2020-04-19 DIAGNOSIS — G43009 Migraine without aura, not intractable, without status migrainosus: Secondary | ICD-10-CM | POA: Diagnosis not present

## 2020-04-25 ENCOUNTER — Telehealth: Payer: Self-pay | Admitting: Neurology

## 2020-04-25 NOTE — Telephone Encounter (Signed)
Patient called to report "problems getting rid of an ongoing headache." Patient reported she took her sumatriptan 100 MG last Thursday, 05/19/20 and yesterday, 04/24/20.   She still has a headache today and wants to know if it is too soon to take another dose.

## 2020-04-25 NOTE — Telephone Encounter (Signed)
Pt advised of dr. Tomi Likens note below

## 2020-04-25 NOTE — Telephone Encounter (Signed)
Telephone call to pt to see if she had takes her Topirmate as well at bedtime daily.   Pt states today the Headache is worse and wanted to know if she should come for a headache cocktail or if she is okay to take another Sumatriptan? Pt states she was told at her last visit that the Sumatriptan should be taken every 7 days. Please advise.

## 2020-04-25 NOTE — Telephone Encounter (Signed)
If she has a driver, she may come in for a headache cocktail.  Otherwise, she may come in for a toradol 60mg  shot alone.  The rule is that she may take sumatriptan no more than 2 days out of a week (and no more than 2 tablets in a day as directed.  So she can repeat the sumatriptan in 2 hours if needed).

## 2020-06-06 ENCOUNTER — Other Ambulatory Visit: Payer: Self-pay

## 2020-06-06 ENCOUNTER — Ambulatory Visit (HOSPITAL_COMMUNITY)
Admission: EM | Admit: 2020-06-06 | Discharge: 2020-06-06 | Disposition: A | Discharge status: Home / Self Care | Payer: Medicaid Other | Attending: Internal Medicine | Admitting: Internal Medicine

## 2020-06-06 ENCOUNTER — Other Ambulatory Visit: Payer: Self-pay | Admitting: Neurology

## 2020-06-06 ENCOUNTER — Encounter (HOSPITAL_COMMUNITY): Payer: Self-pay

## 2020-06-06 DIAGNOSIS — Z20822 Contact with and (suspected) exposure to covid-19: Secondary | ICD-10-CM | POA: Insufficient documentation

## 2020-06-06 DIAGNOSIS — F17210 Nicotine dependence, cigarettes, uncomplicated: Secondary | ICD-10-CM | POA: Insufficient documentation

## 2020-06-06 DIAGNOSIS — Z791 Long term (current) use of non-steroidal anti-inflammatories (NSAID): Secondary | ICD-10-CM | POA: Insufficient documentation

## 2020-06-06 DIAGNOSIS — J449 Chronic obstructive pulmonary disease, unspecified: Secondary | ICD-10-CM | POA: Insufficient documentation

## 2020-06-06 DIAGNOSIS — Z793 Long term (current) use of hormonal contraceptives: Secondary | ICD-10-CM | POA: Diagnosis not present

## 2020-06-06 DIAGNOSIS — R059 Cough, unspecified: Secondary | ICD-10-CM

## 2020-06-06 DIAGNOSIS — Z87440 Personal history of urinary (tract) infections: Secondary | ICD-10-CM | POA: Diagnosis not present

## 2020-06-06 DIAGNOSIS — Z79899 Other long term (current) drug therapy: Secondary | ICD-10-CM | POA: Insufficient documentation

## 2020-06-06 DIAGNOSIS — R05 Cough: Secondary | ICD-10-CM | POA: Diagnosis not present

## 2020-06-06 LAB — SARS CORONAVIRUS 2 (TAT 6-24 HRS): SARS Coronavirus 2: NEGATIVE

## 2020-06-06 MED ORDER — FLUTICASONE PROPIONATE 50 MCG/ACT NA SUSP: 1.0000 | Freq: Every day | NASAL | 0 refills | Status: DC

## 2020-06-06 MED ORDER — CETIRIZINE HCL 10 MG PO TABS: 10.0000 mg | ORAL_TABLET | Freq: Every day | ORAL | 1 refills | Status: DC

## 2020-06-06 MED ORDER — ALBUTEROL SULFATE HFA 108 (90 BASE) MCG/ACT IN AERS: 1.0000 | INHALATION_SPRAY | Freq: Four times a day (QID) | RESPIRATORY_TRACT | 1 refills | Status: DC | PRN

## 2020-06-06 NOTE — ED Provider Notes (Signed)
[place]    CSN: [id number] Arrival date & time: 06/06/20  0841      History   Chief Complaint Chief Complaint  Patient presents with  . Cough  . Sore Throat  . Nasal Congestion    HPI Michele Avila is a 29 y.o. female.   Patient is a 29 year old female past medical history of anemia, asthma, environmental allergies, seasonal allergies.  She presents today with approximate 1 day of scratchy throat, cough.  Symptoms have been constant.  Reporting history of chronic bronchitis.  Mild nasal congestion.  Has not taken any medicine for her symptoms.  Is out of her albuterol and allergy medicine.  No fevers, chills or body aches, night sweats, ear pain, nausea or vomiting.  Works for the [employer], dispensing.  ROS per HPI      Past Medical History:  Diagnosis Date  . Anemia   . Asthma    hx chronic bronchitis/exercise induced asthma as pre teen only  . Environmental allergies   . Infection    UTI  . Migraine   . Seasonal allergies   . Wrist fracture, right     Patient Active Problem List   Diagnosis Date Noted  . Acute pain of left knee 05/24/2017  . Pain in right wrist 05/24/2017  . Mirena intrauterine device in place 03/02/2016    Past Surgical History:  Procedure Laterality Date  . CESAREAN SECTION  2008  . CESAREAN SECTION N/A 06/15/2013   Procedure: CESAREAN SECTION;  Surgeon: Allyn Kenner, DO;  Location: [facility];  Service: Obstetrics;  Laterality: N/A;  . DILATION AND CURETTAGE OF UTERUS  08/2011  . FOOT SURGERY  2017  . INDUCED ABORTION      OB History    Gravida  5   Para  2   Term  2   Preterm      AB  3   Living  2     SAB      TAB  2   Ectopic      Multiple      Live Births  1            Home Medications    Prior to Admission medications   Medication Sig Start Date End Date Taking? Authorizing Provider  albuterol (VENTOLIN HFA) 108 (90 Base) MCG/ACT inhaler Inhale 1-2 puffs into the lungs every 6  (six) hours as needed for wheezing or shortness of breath. 06/06/20   Loura Halt A, NP  brompheniramine-pseudoephedrine-DM 30-2-10 MG/5ML syrup Take 5 mLs by mouth 4 (four) times daily as needed. 04/02/20   Wieters, Hallie C, PA-C  cetirizine (ZYRTEC) 10 MG tablet Take 1 tablet (10 mg total) by mouth daily. 06/06/20   Loura Halt A, NP  diazepam (VALIUM) 5 MG tablet Take 5-10 mg by mouth at bedtime. 04/30/20   [provider]  fluticasone (FLONASE) 50 MCG/ACT nasal spray Place 1-2 sprays into both nostrils daily for 7 days. 06/06/20 06/13/20  Loura Halt A, NP  ibuprofen (ADVIL) 600 MG tablet Take 1 tablet (600 mg total) by mouth every 6 (six) hours as needed. 04/02/20   Wieters, Hallie C, PA-C  Levonorgestrel (MIRENA, 52 MG, IU) 1 each by Intrauterine route once.     [provider]  SUMAtriptan (IMITREX) 100 MG tablet Take 1 tablet (100 mg total) by mouth daily as needed for migraine or headache. 03/01/20   Tomi Likens, Adam R, DO  topiramate (TOPAMAX) 100 MG tablet Take  1 tablet (100 mg total) by mouth at bedtime. 02/02/20   Pieter Partridge, DO  esomeprazole (NEXIUM) 40 MG capsule Take 1 capsule (40 mg total) by mouth daily. Patient not taking: Reported on 09/16/2019 07/12/19 10/12/19  Vanessa Kick, MD  loratadine (CLARITIN) 10 MG tablet Take 1 tablet (10 mg total) by mouth daily. Patient not taking: Reported on 12/03/2019 10/12/19 01/16/20  Wieters, Michele Hacker, PA-C  promethazine (PHENERGAN) 25 MG suppository Place 1 suppository (25 mg total) rectally every 6 (six) hours as needed for nausea or vomiting. Patient not taking: Reported on 04/19/2020 12/17/19 06/06/20  Tacy Learn, PA-C    Family History Family History  Problem Relation Age of Onset  . Cancer Father        liver  . Diabetes Father   . Hypertension Father   . Hypertension Mother   . Hypertension Maternal Grandmother   . Kidney disease Maternal Grandmother   . Diabetes Paternal Grandmother   . Hypertension Paternal Grandmother    . Ulcers Brother   . Crohn's disease Brother     Social History Social History   Tobacco Use  . Smoking status: Current Every Day Smoker    Packs/day: 0.50    Types: Cigarettes  . Smokeless tobacco: Never Used  . Tobacco comment: down to 5 cigarrettes/day  Vaping Use  . Vaping Use: Never used  Substance Use Topics  . Alcohol use: Yes    Comment: occassionally   . Drug use: Yes    Types: Marijuana     Allergies   [name] [camphor-menthol], Yellow dyes (non-tartrazine), and Latex   Review of Systems Review of Systems   Physical Exam Triage Vital Signs ED Triage Vitals  Enc Vitals Group     BP 06/06/20 0917 131/84     Pulse Rate 06/06/20 0917 70     Resp 06/06/20 0917 19     Temp 06/06/20 0917 99 F (37.2 C)     Temp Source 06/06/20 0917 Oral     SpO2 06/06/20 0917 99 %     Weight 06/06/20 0921 239 lb 12.8 oz (108.8 kg)     Height --      Head Circumference --      Peak Flow --      Pain Score 06/06/20 0913 0     Pain Loc --      Pain Edu? --      Excl. in [facility]? --    No data found.  Updated Vital Signs BP 131/84 (BP Location: Right Arm)   Pulse 70   Temp 99 F (37.2 C) (Oral)   Resp 19   Wt 239 lb 12.8 oz (108.8 kg)   SpO2 99%   BMI 35.41 kg/m   Visual Acuity Right Eye Distance:   Left Eye Distance:   Bilateral Distance:    Right Eye Near:   Left Eye Near:    Bilateral Near:     Physical Exam Vitals and nursing note reviewed.  Constitutional:      General: She is not in acute distress.    Appearance: Normal appearance. She is not ill-appearing, toxic-appearing or diaphoretic.  HENT:     Head: Normocephalic.     Right Ear: Tympanic membrane and ear canal normal.     Left Ear: Tympanic membrane and ear canal normal.     Nose: Congestion present.     Mouth/Throat:     Pharynx: Oropharynx is clear. Posterior oropharyngeal erythema present.  Eyes:  Conjunctiva/sclera: Conjunctivae normal.  Cardiovascular:     Rate and Rhythm:  Normal rate and regular rhythm.  Pulmonary:     Effort: Pulmonary effort is normal.     Breath sounds: Normal breath sounds.  Musculoskeletal:        General: Normal range of motion.     Cervical back: Normal range of motion.  Skin:    General: Skin is warm and dry.     Findings: No rash.  Neurological:     Mental Status: She is alert.  Psychiatric:        Mood and Affect: Mood normal.      UC Treatments / Results  Labs (all labs ordered are listed, but only abnormal results are displayed) Labs Reviewed  SARS CORONAVIRUS 2 (TAT 6-24 HRS)    EKG   Radiology No results found.  Procedures Procedures (including critical care time)  Medications Ordered in UC Medications - No data to display  Initial Impression / Assessment and Plan / UC Course  I have reviewed the triage vital signs and the nursing notes.  Pertinent labs & imaging results that were available during my care of the patient were reviewed by me and considered in my medical decision making (see chart for details).     Cough Most likely allergy induced. We will restart the cetirizine and Flonase daily. Albuterol as needed Follow up as needed for continued or worsening symptoms  Final Clinical Impressions(s) / UC Diagnoses   Final diagnoses:  Cough     Discharge Instructions     Believe that your symptoms are most likely allergy induced. Take the cetirizine daily.  Flonase nasal spray daily.  Albuterol as needed. Rest Follow up as needed for continued or worsening symptoms     ED Prescriptions    Medication Sig Dispense Auth. Provider   albuterol (VENTOLIN HFA) 108 (90 Base) MCG/ACT inhaler Inhale 1-2 puffs into the lungs every 6 (six) hours as needed for wheezing or shortness of breath. 18 g Jean Alejos A, NP   cetirizine (ZYRTEC) 10 MG tablet Take 1 tablet (10 mg total) by mouth daily. 30 tablet Starr Urias A, NP   fluticasone (FLONASE) 50 MCG/ACT nasal spray Place 1-2 sprays into both  nostrils daily for 7 days. 1 g Loura Halt A, NP     PDMP not reviewed this encounter.   Orvan July, NP 06/06/20 [phone]

## 2020-06-06 NOTE — Discharge Instructions (Signed)
Believe that your symptoms are most likely allergy induced. Take the cetirizine daily.  Flonase nasal spray daily.  Albuterol as needed. Rest Follow up as needed for continued or worsening symptoms

## 2020-06-06 NOTE — ED Triage Notes (Signed)
Pt states she woke up with a sore throat, yesterday nasal congestion, and cough started. Pt has not taken anything to relieve discomfort.

## 2020-06-08 ENCOUNTER — Encounter (HOSPITAL_COMMUNITY): Payer: Self-pay

## 2020-06-08 ENCOUNTER — Other Ambulatory Visit: Payer: Self-pay

## 2020-06-08 ENCOUNTER — Ambulatory Visit (HOSPITAL_COMMUNITY)
Admission: EM | Admit: 2020-06-08 | Discharge: 2020-06-08 | Disposition: A | Discharge status: Home / Self Care | Payer: Medicaid Other | Attending: Emergency Medicine | Admitting: Emergency Medicine

## 2020-06-08 DIAGNOSIS — R062 Wheezing: Secondary | ICD-10-CM | POA: Diagnosis not present

## 2020-06-08 DIAGNOSIS — J209 Acute bronchitis, unspecified: Secondary | ICD-10-CM | POA: Diagnosis not present

## 2020-06-08 MED ORDER — SPACER/AERO-HOLDING CHAMBERS DEVI: 1 refills | Status: DC

## 2020-06-08 MED ORDER — AZITHROMYCIN 250 MG PO TABS: ORAL_TABLET | ORAL | 0 refills | Status: DC

## 2020-06-08 MED ORDER — PREDNISONE 50 MG PO TABS: ORAL_TABLET | ORAL | 0 refills | Status: DC

## 2020-06-08 MED ORDER — METHYLPREDNISOLONE ACETATE 80 MG/ML IJ SUSP
120.0000 mg | Freq: Once | INTRAMUSCULAR | Status: AC
  Administered 2020-06-08: 120 mg via INTRAMUSCULAR

## 2020-06-08 MED ORDER — METHYLPREDNISOLONE ACETATE 80 MG/ML IJ SUSP
INTRAMUSCULAR | Status: AC
  Filled 2020-06-08: qty 2

## 2020-06-08 NOTE — ED Triage Notes (Signed)
Pt states she was evaluated here a few days ago for URI sx. States her productive cough has worsened since yesterday with clear/yellow sputum and SOB not improved with albuterol inhaler (no spacer used).  Also reports congestion, runny nose, fatigue, dizziness, chills.  Denies loss of taste/smell, n/v/d, abdominal pain.  Was tested for COVID 3 days ago (negative).  Bilateral lung sounds with mild wheezes. Last dose tylenol was last night. Frequent harsh cough observed.

## 2020-06-08 NOTE — ED Provider Notes (Addendum)
[place]    CSN: [id number] Arrival date & time: 06/08/20  1353      History   Chief Complaint Chief Complaint  Patient presents with  . Cough  . Shortness of Breath    HPI Michele Avila is a 29 y.o. female.   HPI Pt states she was evaluated here 2 days ago for URI sx, providers assessment at that time was that symptoms most likely allergy induced. No sign of infection at that time. Treated with cetirizine daily.  Flonase nasal spray daily.   And albuterol MDI as needed.   Presents to urgent care today with worsening symptoms. States her cough is now productive of yellow sputum. Has worsened since yesterday with yellow sputum and dyspnea/wheezing on exertion, not significantly improved with albuterol MDI inhaler (of note, she has never been prescribed nor used a spacer with her albuterol .)  Also reports congestion, runny nose, fatigue, dizziness, low-grade fever and chills. She smokes cigarettes. Denies loss of taste/smell, n/v/d, abdominal pain. No chest pain or abdominal pain.  On 06/06/20, Was tested for COVID PCR-  SARS CORONAVIRUS 2 (TAT 6-24 HRS) Nasopharyngeal  Swab , was negative.  Last dose tylenol was last night, which helped a little with pain and fever.  Past medical history of exercise-induced asthma as a preteen.  Pertinent items noted in HPI and remainder of comprehensive ROS otherwise negative. She denies chance of pregnancy.    Past Medical History:  Diagnosis Date  . Anemia   . Asthma    hx chronic bronchitis/exercise induced asthma as pre teen only  . Environmental allergies   . Infection    UTI  . Migraine   . Seasonal allergies   . Wrist fracture, right     Patient Active Problem List   Diagnosis Date Noted  . Acute pain of left knee 05/24/2017  . Pain in right wrist 05/24/2017  . Mirena intrauterine device in place 03/02/2016    Past Surgical History:  Procedure Laterality Date  . CESAREAN SECTION  2008  . CESAREAN  SECTION N/A 06/15/2013   Procedure: CESAREAN SECTION;  Surgeon: Allyn Kenner, DO;  Location: [facility];  Service: Obstetrics;  Laterality: N/A;  . DILATION AND CURETTAGE OF UTERUS  08/2011  . FOOT SURGERY  2017  . INDUCED ABORTION      OB History    Gravida  5   Para  2   Term  2   Preterm      AB  3   Living  2     SAB      TAB  2   Ectopic      Multiple      Live Births  1            Home Medications    Prior to Admission medications   Medication Sig Start Date End Date Taking? Authorizing Provider  albuterol (VENTOLIN HFA) 108 (90 Base) MCG/ACT inhaler Inhale 1-2 puffs into the lungs every 6 (six) hours as needed for wheezing or shortness of breath. 06/06/20  Yes Bast, Traci A, NP  cetirizine (ZYRTEC) 10 MG tablet Take 1 tablet (10 mg total) by mouth daily. 06/06/20  Yes Bast, Traci A, NP  fluticasone (FLONASE) 50 MCG/ACT nasal spray Place 1-2 sprays into both nostrils daily for 7 days. 06/06/20 06/13/20 Yes Bast, Traci A, NP  Levonorgestrel (MIRENA, 52 MG, IU) 1 each by Intrauterine route once.    Yes [provider]  ondansetron (ZOFRAN-ODT) 4  MG disintegrating tablet DISSOLVE 1 TABLET(4 MG) ON THE TONGUE EVERY 8 HOURS AS NEEDED FOR NAUSEA OR VOMITING 06/06/20  Yes Jaffe, Adam R, DO  SUMAtriptan (IMITREX) 100 MG tablet TAKE 1 TABLET(100 MG) BY MOUTH DAILY AS NEEDED FOR MIGRAINE OR HEADACHE 06/06/20  Yes Jaffe, Adam R, DO  topiramate (TOPAMAX) 100 MG tablet Take 1 tablet (100 mg total) by mouth at bedtime. 02/02/20  Yes Tomi Likens, Adam R, DO  azithromycin (ZITHROMAX Z-PAK) 250 MG tablet Take 2 tablets on day one, then 1 tablet daily on days 2 through 5 06/08/20   Jacqulyn Cane, MD  brompheniramine-pseudoephedrine-DM 30-2-10 MG/5ML syrup Take 5 mLs by mouth 4 (four) times daily as needed. 04/02/20   Wieters, Hallie C, PA-C  ibuprofen (ADVIL) 600 MG tablet Take 1 tablet (600 mg total) by mouth every 6 (six) hours as needed. 04/02/20   Wieters, Hallie C, PA-C  predniSONE  (DELTASONE) 50 MG tablet Take 1 daily with a meal for 5 days.  Start taking this tomorrow, Sunday 7/18 06/08/20   Jacqulyn Cane, MD  Spacer/Aero-Holding Josiah Lobo DEVI Use spacer as directed with albuterol metered-dose inhaler 06/08/20   Jacqulyn Cane, MD  esomeprazole (NEXIUM) 40 MG capsule Take 1 capsule (40 mg total) by mouth daily. Patient not taking: Reported on 09/16/2019 07/12/19 10/12/19  Vanessa Kick, MD  loratadine (CLARITIN) 10 MG tablet Take 1 tablet (10 mg total) by mouth daily. Patient not taking: Reported on 12/03/2019 10/12/19 01/16/20  Wieters, Elesa Hacker, PA-C  promethazine (PHENERGAN) 25 MG suppository Place 1 suppository (25 mg total) rectally every 6 (six) hours as needed for nausea or vomiting. Patient not taking: Reported on 04/19/2020 12/17/19 06/06/20  Tacy Learn, PA-C    Family History Family History  Problem Relation Age of Onset  . Cancer Father        liver  . Diabetes Father   . Hypertension Father   . Hypertension Mother   . Hypertension Maternal Grandmother   . Kidney disease Maternal Grandmother   . Diabetes Paternal Grandmother   . Hypertension Paternal Grandmother   . Ulcers Brother   . Crohn's disease Brother     Social History Social History   Tobacco Use  . Smoking status: Current Every Day Smoker    Packs/day: 0.50    Types: Cigarettes  . Smokeless tobacco: Never Used  . Tobacco comment: down to 5 cigarrettes/day  Vaping Use  . Vaping Use: Never used  Substance Use Topics  . Alcohol use: Yes    Comment: occassionally   . Drug use: Yes    Types: Marijuana     Allergies   [name] [camphor-menthol], Yellow dyes (non-tartrazine), and Latex   Review of Systems Review of Systems   Physical Exam Triage Vital Signs ED Triage Vitals  Enc Vitals Group     BP 06/08/20 1523 (!) 156/104     Pulse Rate 06/08/20 1523 (!) 110     Resp 06/08/20 1523 (!) 22     Temp 06/08/20 1529 99 F (37.2 C)     Temp Source 06/08/20 1523 Oral      SpO2 06/08/20 1523 100 %     Weight --      Height --      Head Circumference --      Peak Flow --      Pain Score 06/08/20 1526 8     Pain Loc --      Pain Edu? --      Excl. in  [facility]? --    No data found.  Updated Vital Signs BP (!) 156/104 (BP Location: Left Arm)   Pulse (!) 110   Temp 99 F (37.2 C) (Oral)   Resp (!) 22   SpO2 100%    Physical Exam Vitals and nursing note reviewed.  Constitutional:      General: She is not in acute distress.    Appearance: She is well-developed. She is ill-appearing. She is not toxic-appearing or diaphoretic.     Comments: Frequent harsh cough observed. Mild audible wheezes heard. No acute cardiorespiratory distress. Oxygen saturation 100% room air. Recheck BP 142/94. Pulse 90, regular. Respiratory rate 20.  HENT:     Head: Normocephalic and atraumatic.     Right Ear: Tympanic membrane normal.     Left Ear: Tympanic membrane normal.     Nose: Nose normal.     Mouth/Throat:     Mouth: Mucous membranes are moist.     Pharynx: No pharyngeal swelling or oropharyngeal exudate.     Comments: No posterior pharyngeal redness or exudate. Airway intact. Eyes:     General: No scleral icterus.       Right eye: No discharge.        Left eye: No discharge.  Cardiovascular:     Rate and Rhythm: Normal rate and regular rhythm.     Heart sounds: Normal heart sounds. No murmur heard.   Pulmonary:     Effort: Pulmonary effort is normal. No accessory muscle usage or respiratory distress.     Breath sounds: Wheezing (Diffuse late expiratory coarse wheezes bilaterally upper and lower lung fields) and rhonchi (Diffusely, bilaterally) present. No decreased breath sounds or rales.  Abdominal:     Comments: Nondistended  Musculoskeletal:        General: No tenderness.     Cervical back: Neck supple.     Right lower leg: No edema.     Left lower leg: No edema.  Lymphadenopathy:     Cervical: No cervical adenopathy.  Skin:    General: Skin is warm and  dry.     Capillary Refill: Capillary refill takes less than 2 seconds.     Coloration: Skin is not jaundiced.     Findings: No bruising or rash.  Neurological:     General: No focal deficit present.     Mental Status: She is alert and oriented to person, place, and time.  Psychiatric:        Mood and Affect: Mood normal.      UC Treatments / Results  Labs (all labs ordered are listed, but only abnormal results are displayed) Labs Reviewed - No data to display  EKG   Radiology No results found.  Procedures Procedures (including critical care time)  Medications Ordered in UC Medications  methylPREDNISolone acetate (DEPO-MEDROL) injection 120 mg (120 mg Intramuscular Given 06/08/20 1627)    Initial Impression / Assessment and Plan / UC Course  I have reviewed the triage vital signs and the nursing notes.  Pertinent labs & imaging results that were available during my care of the patient were reviewed by me and considered in my medical decision making (see chart for details).     Likely has acute bronchitis with wheezing with a remote history of asthma that is exacerbated now. There could be an allergic component. Possible bacterial or atypical cause. Also in the differential could be a mild walking pneumonia, although there are no rales and oxygen saturation 100% on room air.-We discussed  the option of ordering chest x-ray today, and she declined chest x-ray today.  We will cover bacterial and atypical causes with a azithromycin. Depo-Medrol IM to aggressively treat inflammatory cause for wheezing. Prescription for oral prednisone burst. Rx for spacer sent to pharmacy. We discussed using spacer with albuterol MDI, and gave instructions and spacer use with her AVS. Advised to quit smoking.   Final Clinical Impressions(s) / UC Diagnoses   Final diagnoses:  Acute bronchitis with wheezing     Discharge Instructions     Your Covid test was negative 3 days ago. You  likely have acute bronchitis with wheezing. Sent prescriptions to your pharmacy for a azithromycin, an antibiotic that would cure most bacteria that cause bronchitis or even walking pneumonia.  Prescription for prednisone sent to your pharmacy. Sent a "spacer" prescription to pharmacy to use with albuterol inhaler, 2 puffs every 4-6 hours if needed for wheezing. Here in urgent care, we are giving you a cortisone shot of Depo-Medrol to "jump start" the treatment, which should also help your wheezing and cough. Rest and drink plenty of fluids. Please read attached instruction sheets on how to use a metered-dose inhaler and acute bronchitis. Follow-up with your PCP if no better 1 week, or return here or emergency room if worsening symptoms.    ED Prescriptions    Medication Sig Dispense Auth. Provider   Spacer/Aero-Holding Josiah Lobo DEVI Use spacer as directed with albuterol metered-dose inhaler 1 Product Jacqulyn Cane, MD   azithromycin (ZITHROMAX Z-PAK) 250 MG tablet Take 2 tablets on day one, then 1 tablet daily on days 2 through 5 1 each Jacqulyn Cane, MD   predniSONE (DELTASONE) 50 MG tablet Take 1 daily with a meal for 5 days.  Start taking this tomorrow, Sunday 7/18 5 tablet Jacqulyn Cane, MD     Precautions discussed. Red flags discussed. Questions invited and answered. Patient voiced understanding and agreement.    Jacqulyn Cane, MD 06/12/20 1631    Jacqulyn Cane, MD 06/12/20 [phone]

## 2020-06-08 NOTE — Discharge Instructions (Signed)
Your Covid test was negative 3 days ago. You likely have acute bronchitis with wheezing. Sent prescriptions to your pharmacy for a azithromycin, an antibiotic that would cure most bacteria that cause bronchitis or even walking pneumonia.  Prescription for prednisone sent to your pharmacy. Sent a "spacer" prescription to pharmacy to use with albuterol inhaler, 2 puffs every 4-6 hours if needed for wheezing. Here in urgent care, we are giving you a cortisone shot of Depo-Medrol to "jump start" the treatment, which should also help your wheezing and cough. Rest and drink plenty of fluids. Please read attached instruction sheets on how to use a metered-dose inhaler and acute bronchitis. Follow-up with your PCP if no better 1 week, or return here or emergency room if worsening symptoms.

## 2020-07-27 ENCOUNTER — Emergency Department (HOSPITAL_COMMUNITY): Payer: Medicaid Other

## 2020-07-27 ENCOUNTER — Encounter (HOSPITAL_COMMUNITY): Payer: Self-pay | Admitting: Emergency Medicine

## 2020-07-27 ENCOUNTER — Other Ambulatory Visit: Payer: Self-pay

## 2020-07-27 ENCOUNTER — Emergency Department (HOSPITAL_COMMUNITY)
Admission: EM | Admit: 2020-07-27 | Discharge: 2020-07-27 | Disposition: A | Discharge status: Home / Self Care | Payer: Medicaid Other | Attending: Emergency Medicine | Admitting: Emergency Medicine

## 2020-07-27 DIAGNOSIS — G43809 Other migraine, not intractable, without status migrainosus: Secondary | ICD-10-CM | POA: Diagnosis not present

## 2020-07-27 DIAGNOSIS — Z79899 Other long term (current) drug therapy: Secondary | ICD-10-CM | POA: Diagnosis not present

## 2020-07-27 DIAGNOSIS — G43909 Migraine, unspecified, not intractable, without status migrainosus: Secondary | ICD-10-CM | POA: Diagnosis not present

## 2020-07-27 DIAGNOSIS — J45909 Unspecified asthma, uncomplicated: Secondary | ICD-10-CM | POA: Insufficient documentation

## 2020-07-27 DIAGNOSIS — F17210 Nicotine dependence, cigarettes, uncomplicated: Secondary | ICD-10-CM | POA: Insufficient documentation

## 2020-07-27 DIAGNOSIS — Z91040 Latex allergy status: Secondary | ICD-10-CM | POA: Diagnosis not present

## 2020-07-27 LAB — I-STAT BETA HCG BLOOD, ED (MC, WL, AP ONLY): I-stat hCG, quantitative: <5 m[IU]/mL (ref <5)

## 2020-07-27 IMAGING — CT CT HEAD W/O CM
4 series · 17 of 47 positions shown, 19 images · non-contrast
Comparison: [DATE]

CLINICAL DATA: Vomiting, migraine

EXAM:
CT HEAD WITHOUT CONTRAST
TECHNIQUE: Contiguous axial images were obtained from the base of the skull
through the vertex without intravenous contrast.

[Series 3: head without · axial · non-contrast · 0.44mm/px · z∈[-182,-62]mm · 7 of 33 slices shown, 9 images]
[im 5/33  brain]
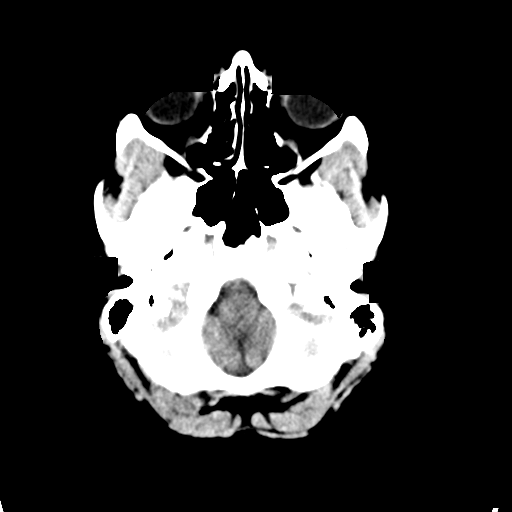
[im 5/33  bone]
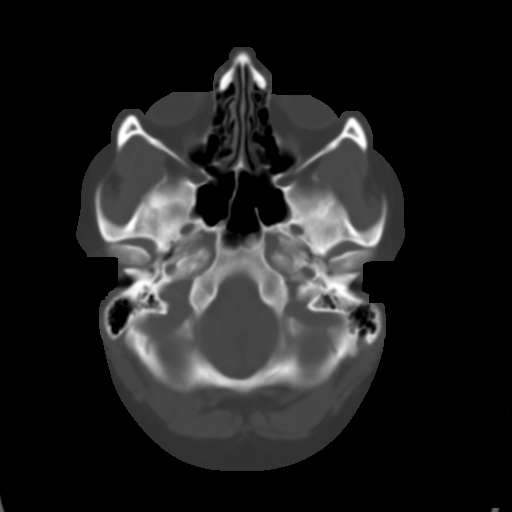
[im 9/33  brain]
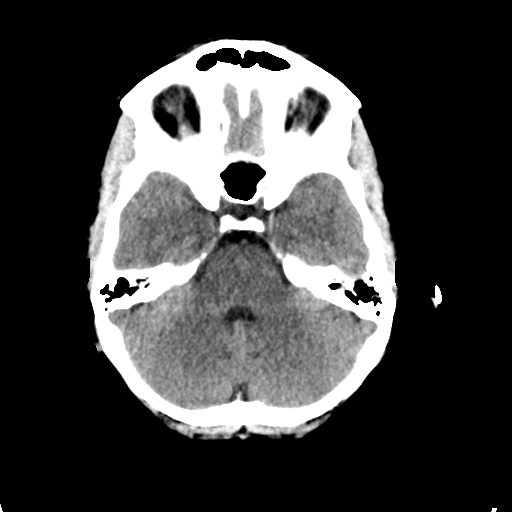
[im 13/33  brain]
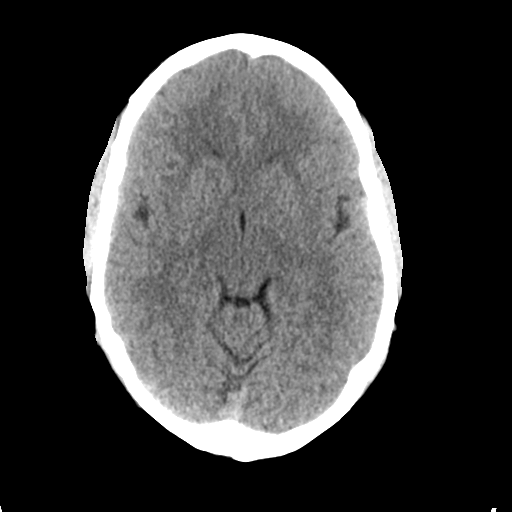
[im 17/33  brain]
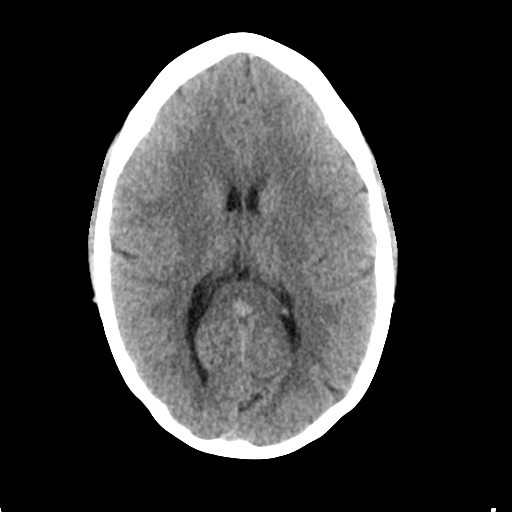
[im 21/33  brain]
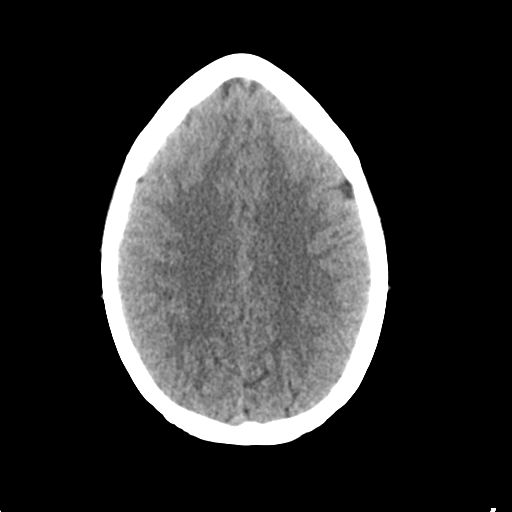
[im 21/33  bone]
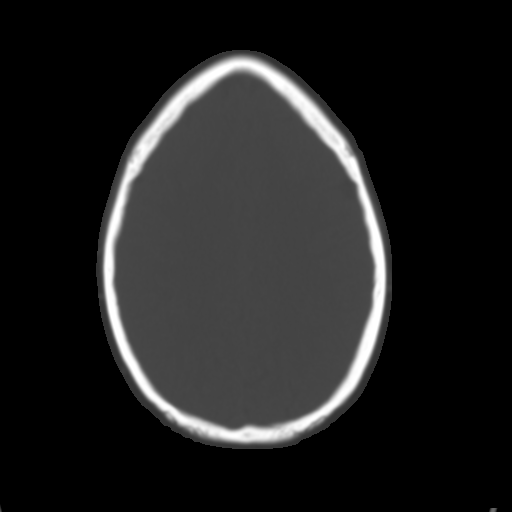
[im 25/33  brain]
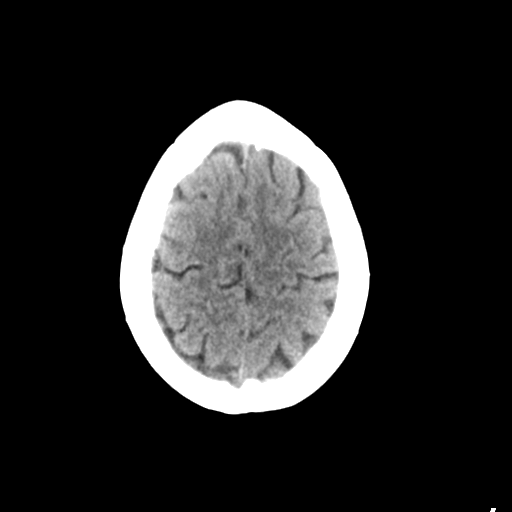
[im 29/33  brain]
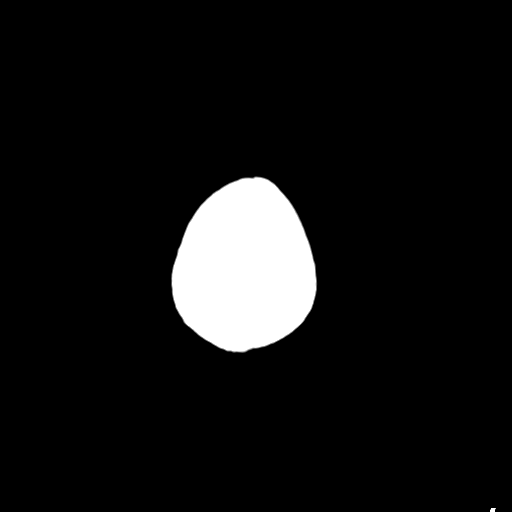

[Series 4: head bone · axial · 0.44mm/px · z∈[-186,-130]mm · 4 of 82 slices shown]
[im 9/82  bone]
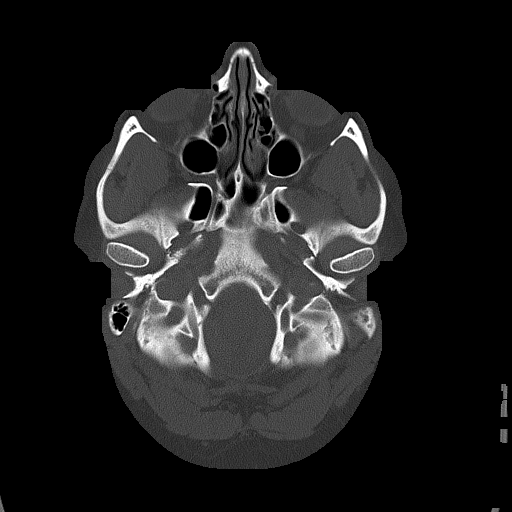
[im 17/82  bone]
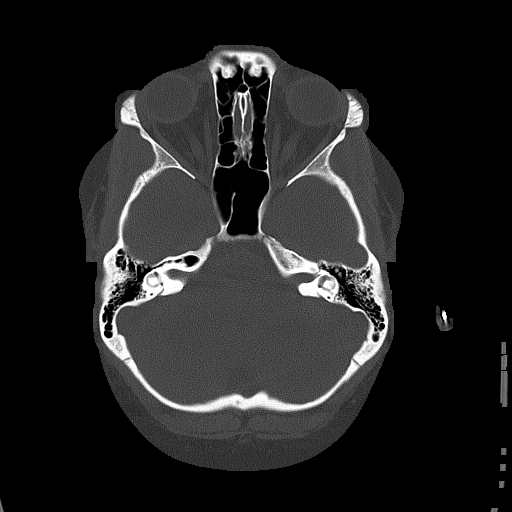
[im 25/82  bone]
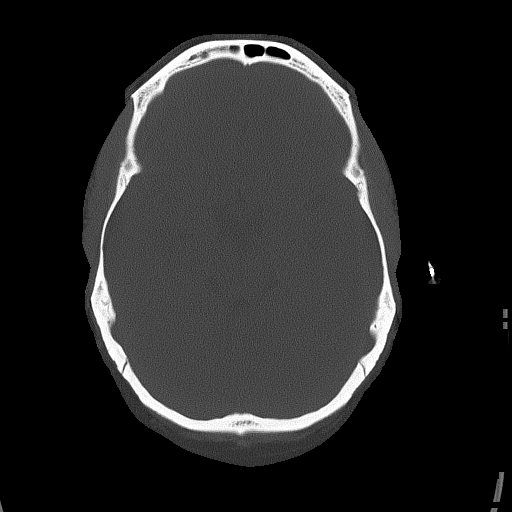
[im 37/82  bone]
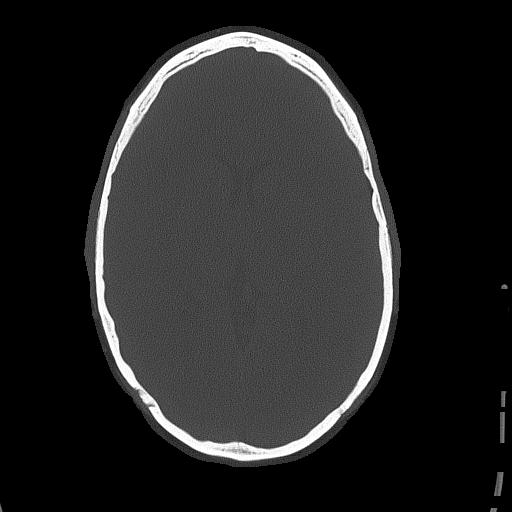

[Series 5: head without cor · coronal · non-contrast · 0.34mm/px · 3 of 71 slices shown]
[im 24/71  brain]
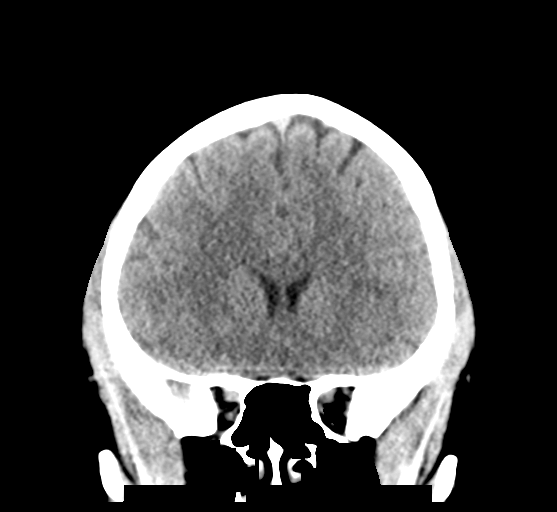
[im 32/71  brain]
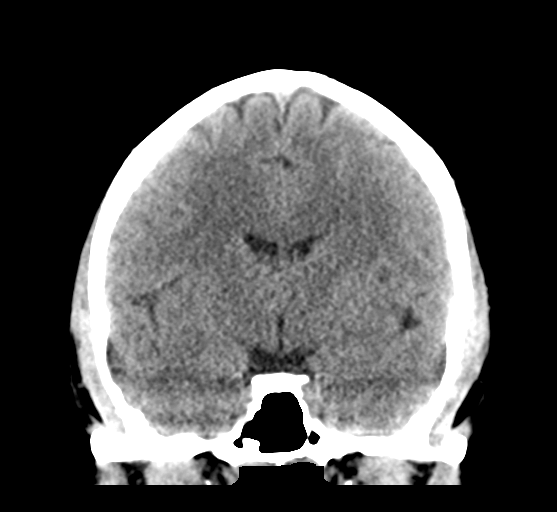
[im 39/71  brain]
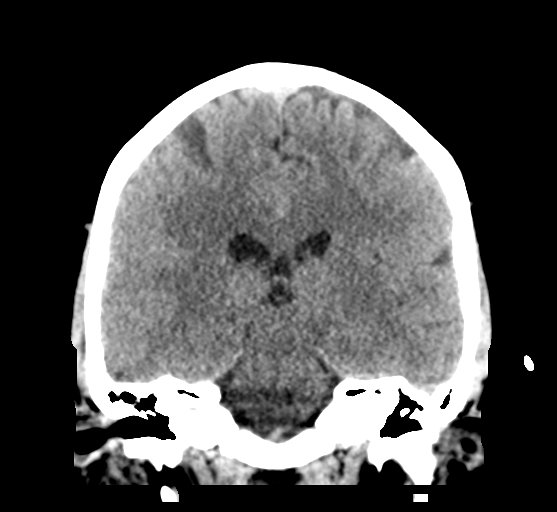

[Series 6: head without sag · sagittal · non-contrast · 0.33mm/px · 3 of 60 slices shown]
[im 20/60  brain]
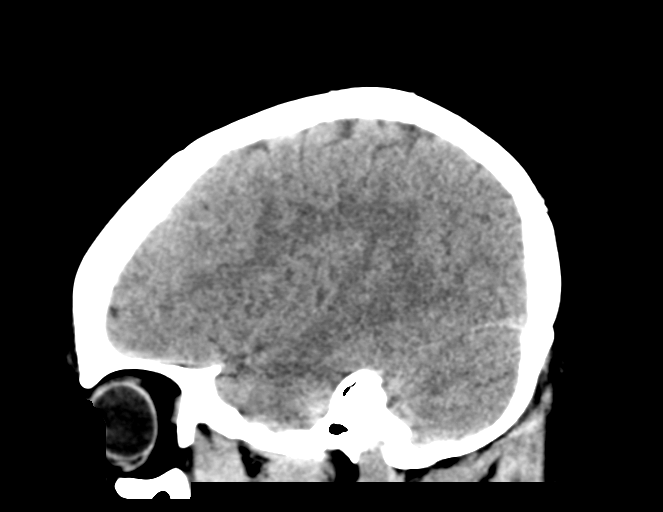
[im 30/60  brain]
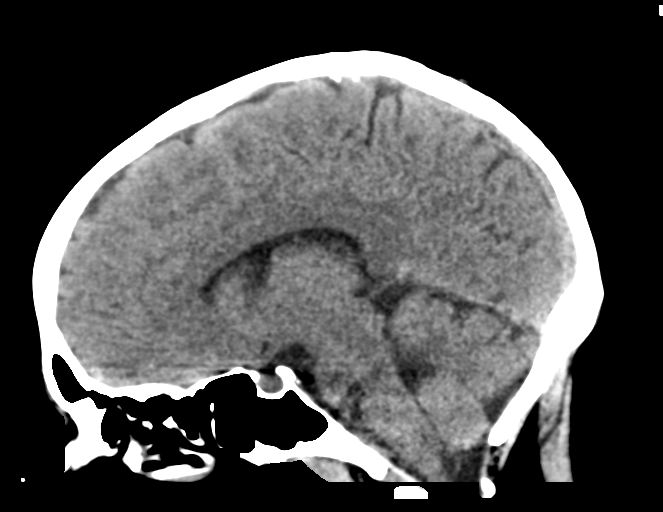
[im 40/60  brain]
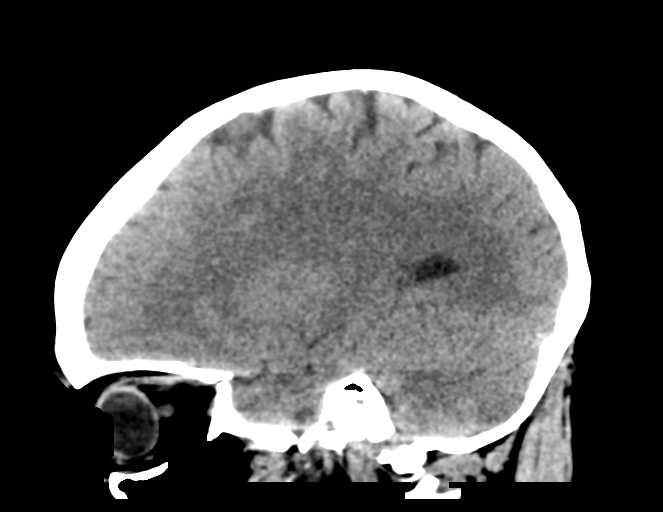

[17 of 47 positions shown; findings below may reference images not displayed]

FINDINGS: Brain: No acute intracranial abnormality. Specifically, no
hemorrhage, hydrocephalus, mass lesion, acute infarction, or
significant intracranial injury.

Vascular: No hyperdense vessel or unexpected calcification.

Skull: No acute calvarial abnormality.

Sinuses/Orbits: Visualized paranasal sinuses and mastoids clear.
Orbital soft tissues unremarkable.

Other: None
IMPRESSION: Normal study.

## 2020-07-27 MED ORDER — SODIUM CHLORIDE 0.9 % IV BOLUS
1000.0000 mL | Freq: Once | INTRAVENOUS | Status: AC
  Administered 2020-07-27: 1000 mL via INTRAVENOUS

## 2020-07-27 MED ORDER — DIPHENHYDRAMINE HCL 50 MG/ML IJ SOLN
25.0000 mg | Freq: Once | INTRAMUSCULAR | Status: AC
  Administered 2020-07-27: 25 mg via INTRAVENOUS
  Filled 2020-07-27: qty 1

## 2020-07-27 MED ORDER — KETOROLAC TROMETHAMINE 30 MG/ML IJ SOLN
30.0000 mg | Freq: Once | INTRAMUSCULAR | Status: AC
  Administered 2020-07-27: 30 mg via INTRAVENOUS
  Filled 2020-07-27: qty 1

## 2020-07-27 MED ORDER — PROCHLORPERAZINE EDISYLATE 10 MG/2ML IJ SOLN
10.0000 mg | Freq: Once | INTRAMUSCULAR | Status: AC
  Administered 2020-07-27: 10 mg via INTRAVENOUS
  Filled 2020-07-27: qty 2

## 2020-07-27 NOTE — ED Notes (Signed)
Pt states that she is leaving due to wait time.  

## 2020-07-27 NOTE — Discharge Instructions (Addendum)
Take your migraine medications as directed.  Follow-up with your neurologist.  Return to emergency department for any worsening pain, numbness/weakness of your arms or legs, persistent nausea/vomiting or any other worsening or concerning symptoms.

## 2020-07-27 NOTE — ED Provider Notes (Signed)
[facility] Provider Note   CSN: [id number] Arrival date & time: 07/27/20  1306     History Chief Complaint  Patient presents with   Migraine    Michele Avila is a 29 y.o. female past history of migraines who presents for evaluation of migraine headache that began this morning at about 7 AM when she woke up.  She states she has history of migraines and takes p.o. medications at home.  Reports her migraine started this morning on the left side of her head.  She states that this is consistent with her previous episodes of migraines.  Describes it as a sharp throbbing pain.  She states she tried taking her medications at home but started having nausea/vomiting could not keep any p.o. down.  Associated with photosensitivity.  She does report that about 2 days ago, she hit her head very hard on a freezer door.  She denies any other trauma, injury.  No fevers, vision changes, numbness/weakness of arms or legs.  The history is provided by the patient.       Past Medical History:  Diagnosis Date   Anemia    Asthma    hx chronic bronchitis/exercise induced asthma as pre teen only   Environmental allergies    Infection    UTI   Migraine    Seasonal allergies    Wrist fracture, right     Patient Active Problem List   Diagnosis Date Noted   Acute pain of left knee 05/24/2017   Pain in right wrist 05/24/2017   Mirena intrauterine device in place 03/02/2016    Past Surgical History:  Procedure Laterality Date   CESAREAN SECTION  2008   CESAREAN SECTION N/A 06/15/2013   Procedure: CESAREAN SECTION;  Surgeon: Allyn Kenner, DO;  Location: [facility];  Service: Obstetrics;  Laterality: N/A;   DILATION AND CURETTAGE OF UTERUS  08/2011   FOOT SURGERY  2017   INDUCED ABORTION       OB History    Gravida  5   Para  2   Term  2   Preterm      AB  3   Living  2     SAB      TAB  2   Ectopic      Multiple      Live  Births  1           Family History  Problem Relation Age of Onset   Cancer Father        liver   Diabetes Father    Hypertension Father    Hypertension Mother    Hypertension Maternal Grandmother    Kidney disease Maternal Grandmother    Diabetes Paternal Grandmother    Hypertension Paternal Grandmother    Ulcers Brother    Crohn's disease Brother     Social History   Tobacco Use   Smoking status: Current Every Day Smoker    Packs/day: 0.50    Types: Cigarettes   Smokeless tobacco: Never Used   Tobacco comment: down to 5 cigarrettes/day  Vaping Use   Vaping Use: Never used  Substance Use Topics   Alcohol use: Yes    Comment: occassionally    Drug use: Yes    Types: Marijuana    Home Medications Prior to Admission medications   Medication Sig Start Date End Date Taking? Authorizing Provider  albuterol (VENTOLIN HFA) 108 (90 Base) MCG/ACT inhaler Inhale 1-2 puffs into the lungs  every 6 (six) hours as needed for wheezing or shortness of breath. 06/06/20   Loura Halt A, NP  azithromycin (ZITHROMAX Z-PAK) 250 MG tablet Take 2 tablets on day one, then 1 tablet daily on days 2 through 5 06/08/20   Jacqulyn Cane, MD  brompheniramine-pseudoephedrine-DM 30-2-10 MG/5ML syrup Take 5 mLs by mouth 4 (four) times daily as needed. 04/02/20   Wieters, Hallie C, PA-C  cetirizine (ZYRTEC) 10 MG tablet Take 1 tablet (10 mg total) by mouth daily. 06/06/20   Bast, Tressia Miners A, NP  fluticasone (FLONASE) 50 MCG/ACT nasal spray Place 1-2 sprays into both nostrils daily for 7 days. 06/06/20 06/13/20  Loura Halt A, NP  ibuprofen (ADVIL) 600 MG tablet Take 1 tablet (600 mg total) by mouth every 6 (six) hours as needed. 04/02/20   Wieters, Hallie C, PA-C  Levonorgestrel (MIRENA, 52 MG, IU) 1 each by Intrauterine route once.     [provider]  ondansetron (ZOFRAN-ODT) 4 MG disintegrating tablet DISSOLVE 1 TABLET(4 MG) ON THE TONGUE EVERY 8 HOURS AS NEEDED FOR NAUSEA OR VOMITING  06/06/20   Tomi Likens, Adam R, DO  predniSONE (DELTASONE) 50 MG tablet Take 1 daily with a meal for 5 days.  Start taking this tomorrow, Sunday 7/18 06/08/20   Jacqulyn Cane, MD  Spacer/Aero-Holding Josiah Lobo DEVI Use spacer as directed with albuterol metered-dose inhaler 06/08/20   Jacqulyn Cane, MD  SUMAtriptan (IMITREX) 100 MG tablet TAKE 1 TABLET(100 MG) BY MOUTH DAILY AS NEEDED FOR MIGRAINE OR HEADACHE 06/06/20   Tomi Likens, Adam R, DO  topiramate (TOPAMAX) 100 MG tablet Take 1 tablet (100 mg total) by mouth at bedtime. 02/02/20   Pieter Partridge, DO  esomeprazole (NEXIUM) 40 MG capsule Take 1 capsule (40 mg total) by mouth daily. Patient not taking: Reported on 09/16/2019 07/12/19 10/12/19  Vanessa Kick, MD  loratadine (CLARITIN) 10 MG tablet Take 1 tablet (10 mg total) by mouth daily. Patient not taking: Reported on 12/03/2019 10/12/19 01/16/20  Wieters, Elesa Hacker, PA-C  promethazine (PHENERGAN) 25 MG suppository Place 1 suppository (25 mg total) rectally every 6 (six) hours as needed for nausea or vomiting. Patient not taking: Reported on 04/19/2020 12/17/19 06/06/20  Suella Broad A, PA-C    Allergies    [name] [camphor-menthol], Yellow dyes (non-tartrazine), and Latex  Review of Systems   Review of Systems  Constitutional: Negative for fever.  Eyes: Negative for visual disturbance.  Respiratory: Negative for cough and shortness of breath.   Cardiovascular: Negative for chest pain.  Gastrointestinal: Positive for nausea and vomiting. Negative for abdominal pain.  Genitourinary: Negative for dysuria and hematuria.  Neurological: Positive for headaches. Negative for weakness and numbness.  All other systems reviewed and are negative.   Physical Exam Updated Vital Signs BP 118/72    Pulse 64    Temp 98.3 F (36.8 C) (Oral)    Resp 16    Ht 5\' 9"  (1.753 m)    Wt 106.6 kg    SpO2 100%    BMI 34.70 kg/m   Physical Exam Vitals and nursing note reviewed.  Constitutional:      Appearance: Normal  appearance. She is well-developed.  HENT:     Head: Normocephalic and atraumatic.  Eyes:     General: Lids are normal.     Conjunctiva/sclera: Conjunctivae normal.     Pupils: Pupils are equal, round, and reactive to light.     Comments: PERRL. EOMs intact. No nystagmus. No neglect.   Neck:  Comments: Neck is supple and without rigidity Cardiovascular:     Rate and Rhythm: Normal rate and regular rhythm.     Pulses: Normal pulses.     Heart sounds: Normal heart sounds. No murmur heard.  No friction rub. No gallop.   Pulmonary:     Effort: Pulmonary effort is normal.     Breath sounds: Normal breath sounds.     Comments: Lungs clear to auscultation bilaterally.  Symmetric chest rise.  No wheezing, rales, rhonchi. Abdominal:     Palpations: Abdomen is soft. Abdomen is not rigid.     Tenderness: There is no abdominal tenderness. There is no guarding.  Musculoskeletal:        General: Normal range of motion.     Cervical back: Full passive range of motion without pain.  Skin:    General: Skin is warm and dry.     Capillary Refill: Capillary refill takes less than 2 seconds.  Neurological:     Mental Status: She is alert and oriented to person, place, and time.     Comments: Cranial nerves III-XII intact Follows commands, Moves all extremities  5/5 strength to BUE and BLE  Sensation intact throughout all major nerve distributions No gait abnormalities  No slurred speech. No facial droop.   Psychiatric:        Speech: Speech normal.     ED Results / Procedures / Treatments   Labs (all labs ordered are listed, but only abnormal results are displayed) Labs Reviewed  I-STAT BETA HCG BLOOD, ED ([facility], [facility], [facility] ONLY)    EKG None  Radiology CT Head Wo Contrast  Result Date: 07/27/2020 CLINICAL DATA:  Vomiting, migraine EXAM: CT HEAD WITHOUT CONTRAST TECHNIQUE: Contiguous axial images were obtained from the base of the skull through the vertex without intravenous contrast.  COMPARISON:  05/07/2019 FINDINGS: Brain: No acute intracranial abnormality. Specifically, no hemorrhage, hydrocephalus, mass lesion, acute infarction, or significant intracranial injury. Vascular: No hyperdense vessel or unexpected calcification. Skull: No acute calvarial abnormality. Sinuses/Orbits: Visualized paranasal sinuses and mastoids clear. Orbital soft tissues unremarkable. Other: None IMPRESSION: Normal study. Electronically Signed   By: Rolm Baptise M.D.   On: 07/27/2020 20:11    Procedures Procedures (including critical care time)  Medications Ordered in ED Medications  sodium chloride 0.9 % bolus 1,000 mL (0 mLs Intravenous Stopped 07/27/20 2105)  prochlorperazine (COMPAZINE) injection 10 mg (10 mg Intravenous Given 07/27/20 1927)  diphenhydrAMINE (BENADRYL) injection 25 mg (25 mg Intravenous Given 07/27/20 1927)  ketorolac (TORADOL) 30 MG/ML injection 30 mg (30 mg Intravenous Given 07/27/20 2049)    ED Course  I have reviewed the triage vital signs and the nursing notes.  Pertinent labs & imaging results that were available during my care of the patient were reviewed by me and considered in my medical decision making (see chart for details).    MDM Rules/Calculators/A&P                          29 year old female who presents for evaluation of headache that began at 7 AM this morning.  History of migraines and states this feels consistent with migraines.  Does report that 2 days ago, she hit her head very hard on a freezer door.  She reports that the headache began this morning when she woke up.  No fevers, numbness/weakness.  She does have some photosensitivity/nausea/vomiting which is common for her migraines.  On initially arrival, she is afebrile nontoxic-appearing.  Vital signs are stable.  On exam, neck is supple and without rigidity.  No meningismal signs.  No neuro deficits noted on exam.  History/physical exam not concerning for CVA, dural venous thrombosis.  Do not suspect  intracranial hemorrhage.  She may have had migraine related to head injury.  Suspect this is most likely migraine headache given that she feels that this is consistent with her migraine. Given recent head injury and the fact that it started this AM, will obtain imaging.  Patient given migraine cocktail.  Reevaluation after migraine cocktail.  Patient reports feeling better.  We will plan to p.o. challenge.  CT head negative for any acute abnormality.  Discussed results with patient.  Patient is sitting upright in bed looking much more comfortable.  She states she feels better and that her headache is gone.  She has been able to tolerate p[place] in the park without any difficulty and states she is ready to leave.  Patient does have a neurologist that she follows.  I discussed with patient that she should follow-up with her neurologist as directed. At this time, patient exhibits no emergent life-threatening condition that require further evaluation in ED or admission. Patient had ample opportunity for questions and discussion. All patient's questions were answered with full understanding. Strict return precautions discussed. Patient expresses understanding and agreement to plan.   Portions of this note were generated with [employer]. Dictation errors may occur despite best attempts at proofreading.   Final Clinical Impression(s) / ED Diagnoses Final diagnoses:  Other migraine without status migrainosus, not intractable    Rx / DC Orders ED Discharge Orders    None       Desma Mcgregor 07/27/20 2121    Lennice Sites, DO 07/27/20 2244

## 2020-07-27 NOTE — ED Triage Notes (Signed)
C/o migraine since 7am with vomiting.  States her neurologist told her to come to ED to get a migraine cocktail.

## 2020-07-30 ENCOUNTER — Telehealth: Payer: Self-pay | Admitting: *Deleted

## 2020-07-30 NOTE — Telephone Encounter (Signed)
Contacted patient to complete Transition of Care Assessment: Transition Care Management Follow-up Telephone Call  Date of discharge and from where: 07/27/20, [facility]  How have you been since you were released from the hospital? "much better"  Any questions or concerns? No  Items Reviewed:  Did the pt receive and understand the discharge instructions provided? Yes   Medications obtained and verified? No   Any new allergies since your discharge? No   Dietary orders reviewed? NO  Do you have support at home? Yes   Functional Questionnaire: (I = Independent and D = Dependent) ADLs: I  Bathing/Dressing- I  Meal Prep- I  Eating- I  Maintaining continence- I  Transferring/Ambulation- I  Managing Meds- I  Follow up appointments reviewed:   PCP Hospital f/u appt confirmed? No Patient would like to be assigned a PCP  [facility] f/u appt confirmed? No Patient to contact Dr Pleas Koch on 07/30/20 to schedule follow up appt  Are transportation arrangements needed? No   If their condition worsens, is the pt aware to call PCP or go to the Emergency Dept.? YES  Was the patient provided with contact information for the PCP's office or ED? YES  Was to pt encouraged to call back with questions or concerns? YES  Lenor Coffin, RN, BSN, [place] [facility] [place] [phone]

## 2020-07-30 NOTE — Telephone Encounter (Signed)
Email sent to [facility] to request follow up for NP appointment. Kerrie Latour  PEC [phone]

## 2020-08-28 DIAGNOSIS — Z20822 Contact with and (suspected) exposure to covid-19: Secondary | ICD-10-CM | POA: Diagnosis not present

## 2020-08-28 DIAGNOSIS — Z03818 Encounter for observation for suspected exposure to other biological agents ruled out: Secondary | ICD-10-CM | POA: Diagnosis not present

## 2020-09-04 ENCOUNTER — Other Ambulatory Visit: Payer: Self-pay | Admitting: Neurology

## 2020-12-06 DIAGNOSIS — Z20828 Contact with and (suspected) exposure to other viral communicable diseases: Secondary | ICD-10-CM | POA: Diagnosis not present

## 2020-12-18 ENCOUNTER — Ambulatory Visit (HOSPITAL_COMMUNITY)
Admission: EM | Admit: 2020-12-18 | Discharge: 2020-12-18 | Disposition: A | Discharge status: Home / Self Care | Payer: Medicaid Other | Attending: Medical Oncology | Admitting: Medical Oncology

## 2020-12-18 ENCOUNTER — Other Ambulatory Visit: Payer: Self-pay

## 2020-12-18 ENCOUNTER — Encounter (HOSPITAL_COMMUNITY): Payer: Self-pay | Admitting: Medical Oncology

## 2020-12-18 DIAGNOSIS — K644 Residual hemorrhoidal skin tags: Secondary | ICD-10-CM | POA: Diagnosis not present

## 2020-12-18 MED ORDER — HYDROCORTISONE (PERIANAL) 2.5 % EX CREA: 1.0000 "application " | TOPICAL_CREAM | Freq: Two times a day (BID) | CUTANEOUS | 0 refills | Status: DC

## 2020-12-18 NOTE — ED Triage Notes (Signed)
Pt reports rectal pain and bleeding that started 3 days ago.

## 2020-12-18 NOTE — ED Provider Notes (Signed)
[facility]    CSN: [id number] Arrival date & time: 12/18/20  [id number]      History   Chief Complaint Chief Complaint  Patient presents with  . Hemorrhoids    HPI Michele Avila is a 30 y.o. female.   HPI   Rectal Bleeding: Patient states that for the past 3 days she has had rectal bleeding and a flare of hemorrhoids.  She states that she has had a loose stool over the past few days secondary to what she suspects is mild food poisoning.  She suspects this is what aggravated her hemorrhoids.  She states that the hemorrhoids do bleed when she has bowel movements or sits on the toilet.  She is having some bleeding that does leak through to her underwear but not much.  She describes the pain of the hemorrhoids at a 4 out of 10 in nature.  She has not tried anything for symptoms.  She denies nosebleeds, bleeding of the gums or abdominal pain. [facility]: Had IUD   Past Medical History:  Diagnosis Date  . Anemia   . Asthma    hx chronic bronchitis/exercise induced asthma as pre teen only  . Environmental allergies   . Infection    UTI  . Migraine   . Seasonal allergies   . Wrist fracture, right     Patient Active Problem List   Diagnosis Date Noted  . Acute pain of left knee 05/24/2017  . Pain in right wrist 05/24/2017  . Mirena intrauterine device in place 03/02/2016    Past Surgical History:  Procedure Laterality Date  . CESAREAN SECTION  2008  . CESAREAN SECTION N/A 06/15/2013   Procedure: CESAREAN SECTION;  Surgeon: Allyn Kenner, DO;  Location: [facility];  Service: Obstetrics;  Laterality: N/A;  . DILATION AND CURETTAGE OF UTERUS  08/2011  . FOOT SURGERY  2017  . INDUCED ABORTION      OB History    Gravida  5   Para  2   Term  2   Preterm      AB  3   Living  2     SAB      IAB  2   Ectopic      Multiple      Live Births  1            Home Medications    Prior to Admission medications   Medication Sig Start Date End Date Taking?  Authorizing Provider  albuterol (VENTOLIN HFA) 108 (90 Base) MCG/ACT inhaler Inhale 1-2 puffs into the lungs every 6 (six) hours as needed for wheezing or shortness of breath. 06/06/20  Yes Bast, Traci A, NP  hydrocortisone (ANUSOL-HC) 2.5 % rectal cream Place 1 application rectally 2 (two) times daily. 12/18/20  Yes Covington, Judson Roch M, PA-C  ondansetron (ZOFRAN-ODT) 4 MG disintegrating tablet DISSOLVE 1 TABLET(4 MG) ON THE TONGUE EVERY 8 HOURS AS NEEDED FOR NAUSEA OR VOMITING 06/06/20  Yes Pieter Partridge, DO  Spacer/Aero-Holding Chambers DEVI Use spacer as directed with albuterol metered-dose inhaler 06/08/20  Yes Jacqulyn Cane, MD  SUMAtriptan (IMITREX) 100 MG tablet TAKE 1 TABLET(100 MG) BY MOUTH DAILY AS NEEDED FOR MIGRAINE OR HEADACHE 06/06/20  Yes Tomi Likens, Adam R, DO  topiramate (TOPAMAX) 100 MG tablet Take 1 tablet (100 mg total) by mouth at bedtime. 02/02/20  Yes Jaffe, Adam R, DO  Levonorgestrel (MIRENA, 52 MG, IU) 1 each by Intrauterine route once.     [provider]  cetirizine (ZYRTEC) 10 MG tablet Take 1 tablet (10 mg total) by mouth daily. 06/06/20 12/18/20  Loura Halt A, NP  esomeprazole (NEXIUM) 40 MG capsule Take 1 capsule (40 mg total) by mouth daily. Patient not taking: Reported on 09/16/2019 07/12/19 10/12/19  Vanessa Kick, MD  fluticasone [facility]) 50 MCG/ACT nasal spray Place 1-2 sprays into both nostrils daily for 7 days. 06/06/20 12/18/20  Loura Halt A, NP  loratadine (CLARITIN) 10 MG tablet Take 1 tablet (10 mg total) by mouth daily. Patient not taking: Reported on 12/03/2019 10/12/19 01/16/20  Wieters, Elesa Hacker, PA-C  promethazine (PHENERGAN) 25 MG suppository Place 1 suppository (25 mg total) rectally every 6 (six) hours as needed for nausea or vomiting. Patient not taking: Reported on 04/19/2020 12/17/19 06/06/20  Tacy Learn, PA-C    Family History Family History  Problem Relation Age of Onset  . Cancer Father        liver  . Diabetes Father   . Hypertension Father    . Hypertension Mother   . Hypertension Maternal Grandmother   . Kidney disease Maternal Grandmother   . Diabetes Paternal Grandmother   . Hypertension Paternal Grandmother   . Ulcers Brother   . Crohn's disease Brother     Social History Social History   Tobacco Use  . Smoking status: Current Every Day Smoker    Packs/day: 0.50    Types: Cigarettes  . Smokeless tobacco: Never Used  . Tobacco comment: down to 5 cigarrettes/day  Vaping Use  . Vaping Use: Never used  Substance Use Topics  . Alcohol use: Yes    Comment: occassionally   . Drug use: Yes    Types: Marijuana     Allergies   [name] [camphor-menthol], Yellow dyes (non-tartrazine), and Latex   Review of Systems Review of Systems  As stated above in HPI Physical Exam Triage Vital Signs ED Triage Vitals  Enc Vitals Group     BP 12/18/20 1858 134/89     Pulse Rate 12/18/20 1858 75     Resp 12/18/20 1858 16     Temp 12/18/20 1858 99 F (37.2 C)     Temp Source 12/18/20 1858 Oral     SpO2 12/18/20 1858 98 %     Weight --      Height --      Head Circumference --      Peak Flow --      Pain Score 12/18/20 1859 4     Pain Loc --      Pain Edu? --      Excl. in [place]? --    No data found.  Updated Vital Signs BP 134/89 (BP Location: Right Arm)   Pulse 75   Temp 99 F (37.2 C) (Oral)   Resp 16   LMP 12/19/2015   SpO2 98%   Physical Exam Vitals and nursing note reviewed. Exam conducted with a chaperone present.  Genitourinary:    Comments: There is a 1 resolving hemorrhoid at 12:00 that appears to be healing from a bleeding episode.  Appears to previously have been thrombosed and resolving on its own.  No active bleeding.  No other lesions of concern.  No evidence of fistula or foreign body     UC Treatments / Results  Labs (all labs ordered are listed, but only abnormal results are displayed) Labs Reviewed - No data to display   Radiology No results found.  Procedures Procedures  (including critical care time)  Medications  Ordered in UC Medications - No data to display  Initial Impression / Assessment and Plan / UC Course  I have reviewed the triage vital signs and the nursing notes.  Pertinent labs & imaging results that were available during my care of the patient were reviewed by me and considered in my medical decision making (see chart for details).     New.  Discussed my findings with patient.  At this time I am going to send her home with instructions to complete sits baths which we discussed along with topical Anusol cream.  We discussed how to use.  We also discussed precautions.  Bleeding should resolve within 5 days and should not be more than what can be contained in a overnight pad per day.  Final Clinical Impressions(s) / UC Diagnoses   Final diagnoses:  External hemorrhoid, bleeding   Discharge Instructions   None    ED Prescriptions    Medication Sig Dispense Auth. Provider   hydrocortisone (ANUSOL-HC) 2.5 % rectal cream Place 1 application rectally 2 (two) times daily. 30 g Covington, Sarah M, [place]     PDMP not reviewed this encounter.   Hughie Closs, [place] 12/18/20 1937
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
## 2021-01-20 ENCOUNTER — Encounter: Payer: Self-pay | Admitting: Neurology

## 2021-01-20 ENCOUNTER — Ambulatory Visit: Payer: Medicaid Other | Admitting: Neurology

## 2021-01-20 ENCOUNTER — Other Ambulatory Visit: Payer: Self-pay

## 2021-01-20 VITALS — BP 136/85 | HR 97 | Resp 18 | Ht 69.0 in | Wt 249.0 lb

## 2021-01-20 DIAGNOSIS — G43009 Migraine without aura, not intractable, without status migrainosus: Secondary | ICD-10-CM | POA: Diagnosis not present

## 2021-01-20 MED ORDER — ONDANSETRON 4 MG PO TBDP: 4.0000 mg | ORAL_TABLET | Freq: Three times a day (TID) | ORAL | 3 refills | Status: DC | PRN

## 2021-01-20 MED ORDER — SUMATRIPTAN SUCCINATE 100 MG PO TABS
ORAL_TABLET | ORAL | 3 refills | Status: DC
Start: 2021-01-20 — End: 2021-05-13

## 2021-01-20 MED ORDER — NURTEC 75 MG PO TBDP: 75.0000 mg | ORAL_TABLET | ORAL | 5 refills | Status: DC

## 2021-01-20 NOTE — Patient Instructions (Signed)
1.  Start Nurtec every other day 2.  Use sumatriptan and ondansetron if needed.  Limit use of pain relievers to no more than 2 days out of week to prevent risk of rebound or medication-overuse headache. 3.  Keep headache diary 4.  Follow up 6 months

## 2021-01-22 ENCOUNTER — Encounter: Payer: Self-pay | Admitting: Neurology

## 2021-01-22 NOTE — Progress Notes (Signed)
Received fax approval for the Nurtec 75 mg valid until 01/21/22. Case #: [id number].

## 2021-02-23 ENCOUNTER — Encounter (HOSPITAL_COMMUNITY): Payer: Self-pay

## 2021-02-23 ENCOUNTER — Ambulatory Visit (HOSPITAL_COMMUNITY)
Admission: EM | Admit: 2021-02-23 | Discharge: 2021-02-23 | Disposition: A | Discharge status: Home / Self Care | Payer: Medicaid Other | Attending: Medical Oncology | Admitting: Medical Oncology

## 2021-02-23 ENCOUNTER — Ambulatory Visit (INDEPENDENT_AMBULATORY_CARE_PROVIDER_SITE_OTHER): Payer: Medicaid Other

## 2021-02-23 ENCOUNTER — Other Ambulatory Visit: Payer: Self-pay

## 2021-02-23 DIAGNOSIS — M654 Radial styloid tenosynovitis [de Quervain]: Secondary | ICD-10-CM | POA: Diagnosis not present

## 2021-02-23 DIAGNOSIS — M25531 Pain in right wrist: Secondary | ICD-10-CM | POA: Diagnosis not present

## 2021-02-23 DIAGNOSIS — Z8781 Personal history of (healed) traumatic fracture: Secondary | ICD-10-CM | POA: Diagnosis not present

## 2021-02-23 IMAGING — DX DG WRIST COMPLETE 3+V*R*
4 series · 4 of 4 positions shown · non-contrast
Comparison: None.

CLINICAL DATA: Right wrist pain.  Remote history of wrist fracture.

EXAM:
RIGHT WRIST - COMPLETE 3+ VIEW

[wrist pa]
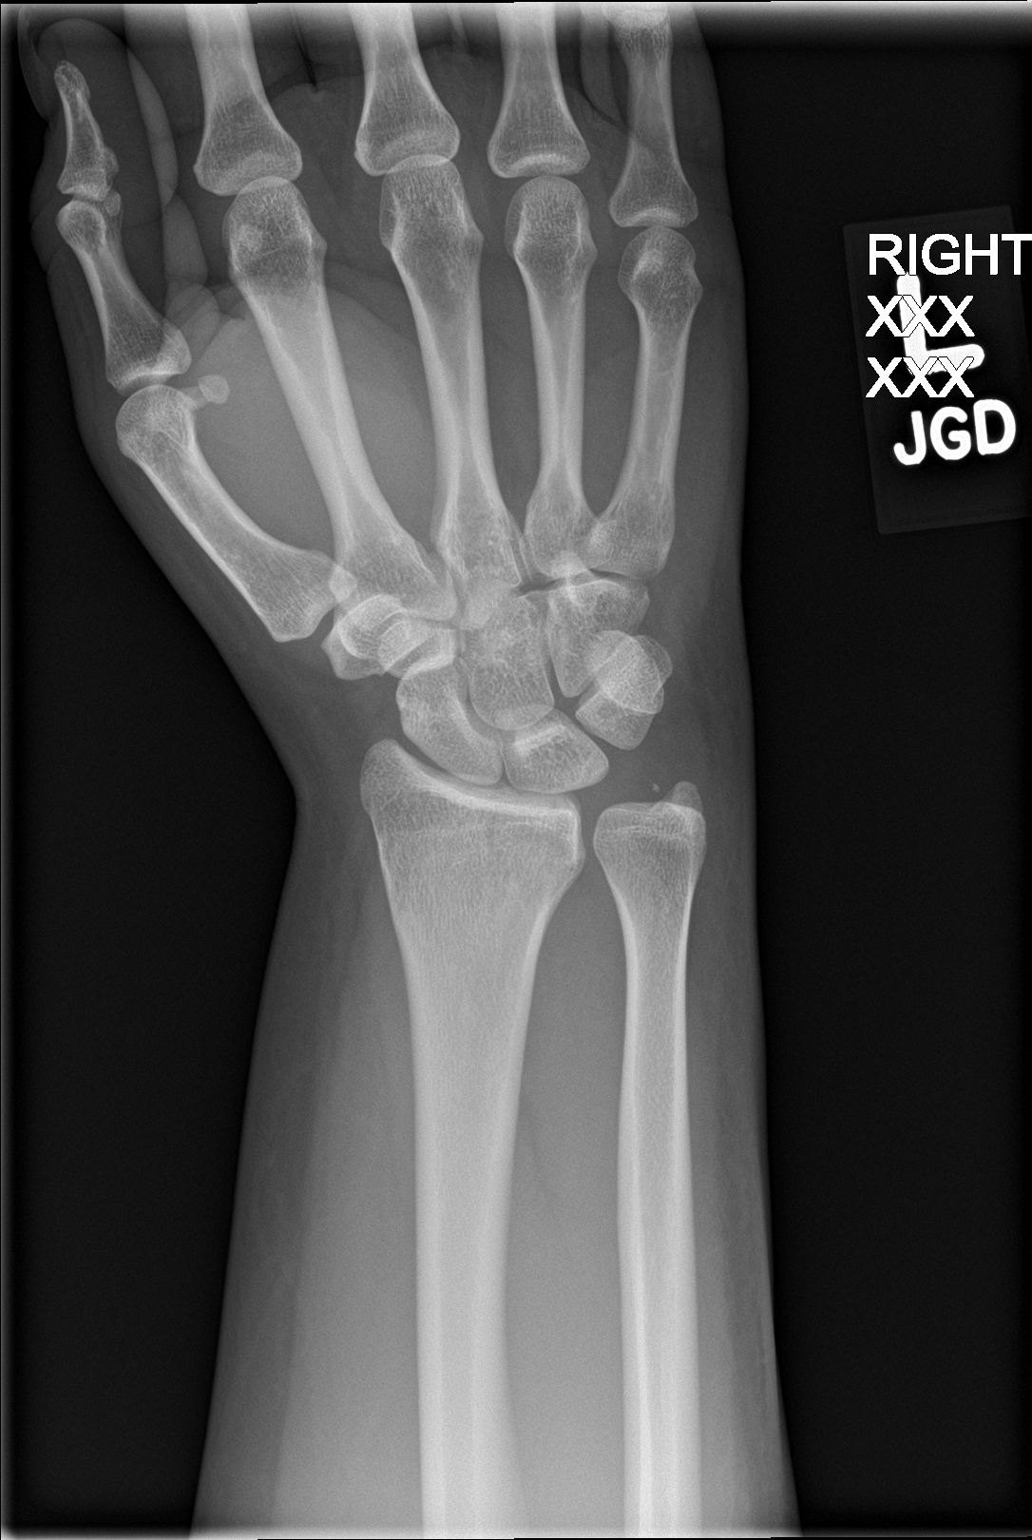

[wrist navicular]
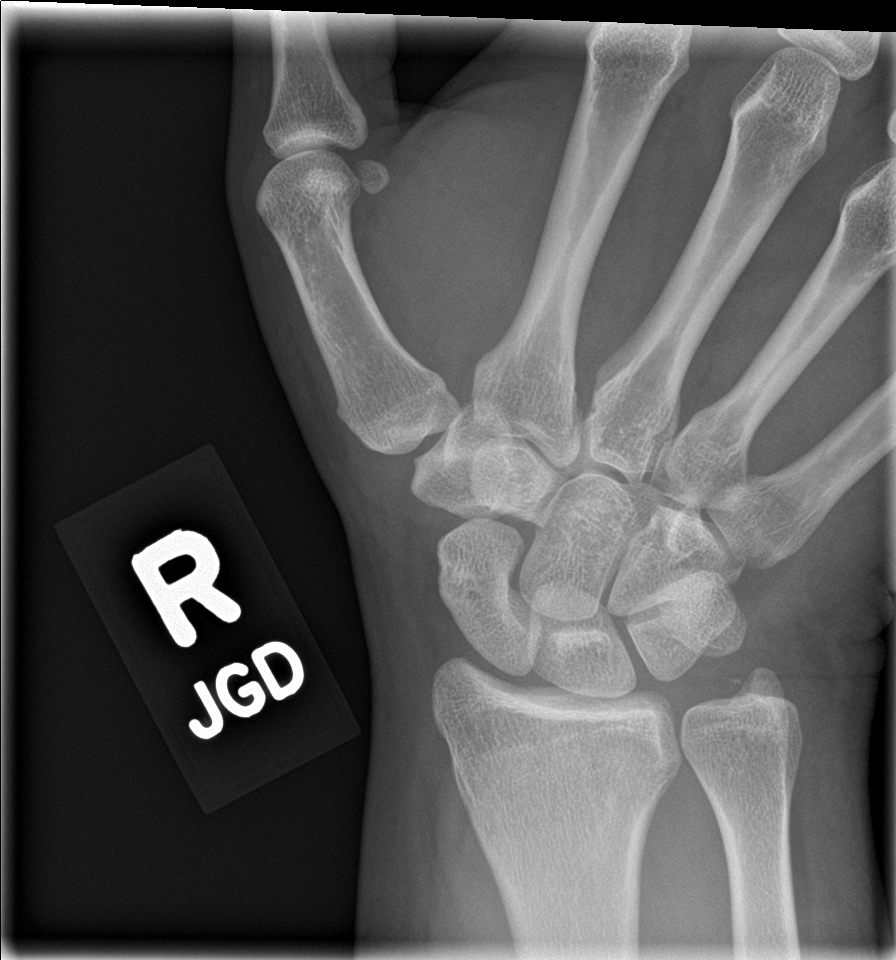

[wrist obl]
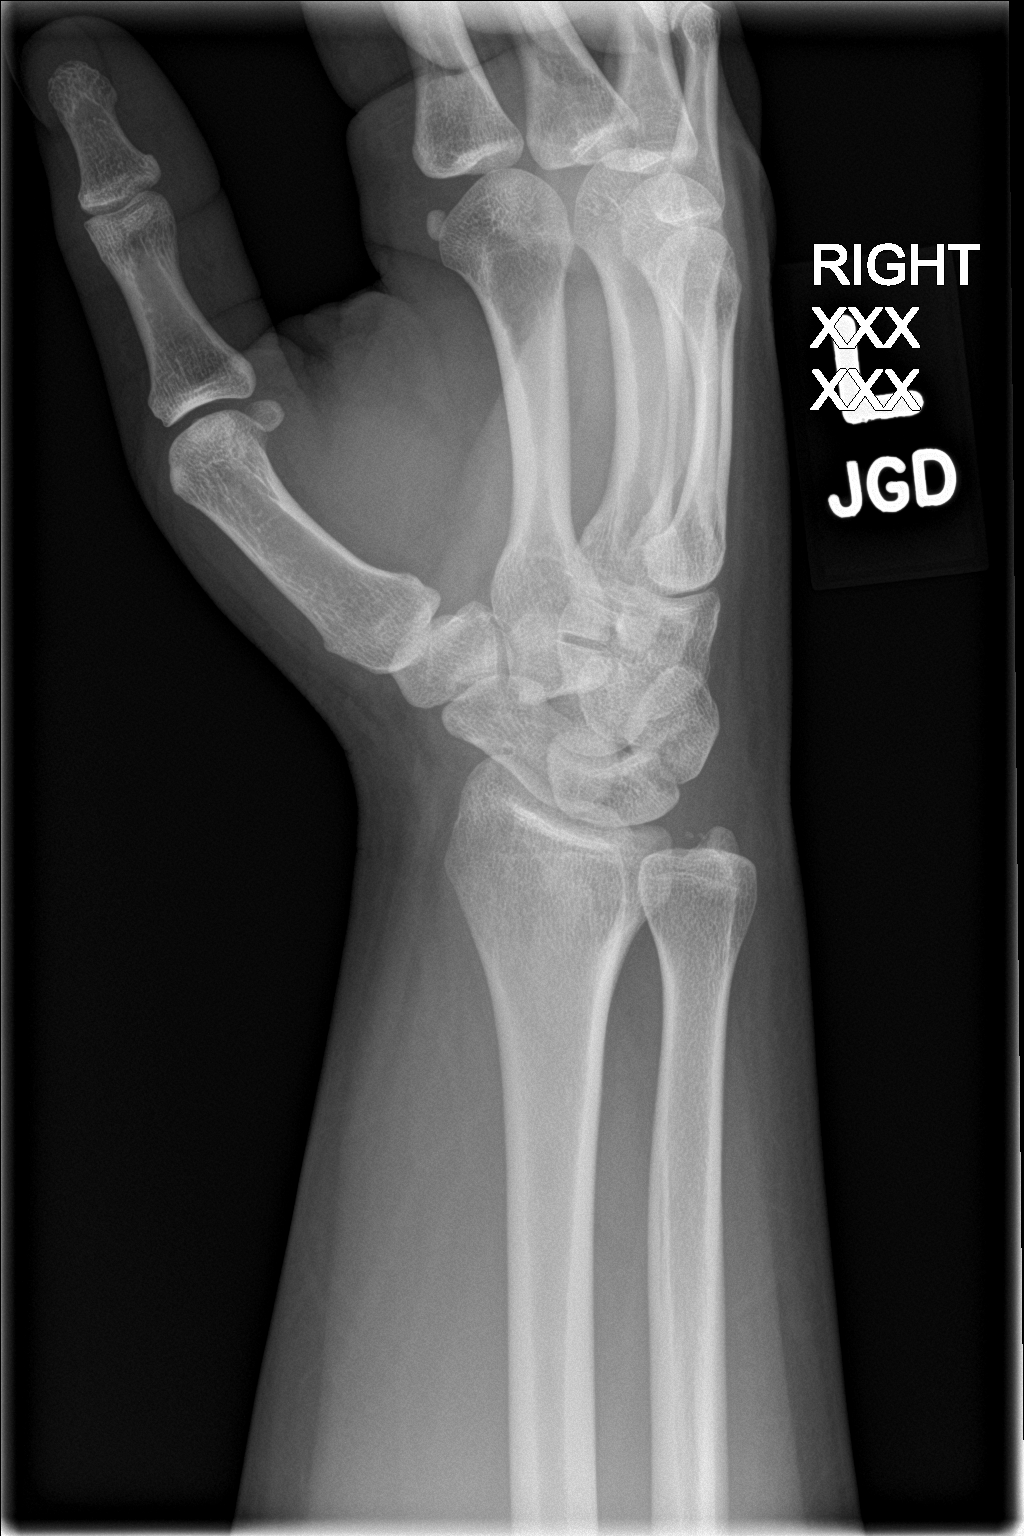

[wrist lat]
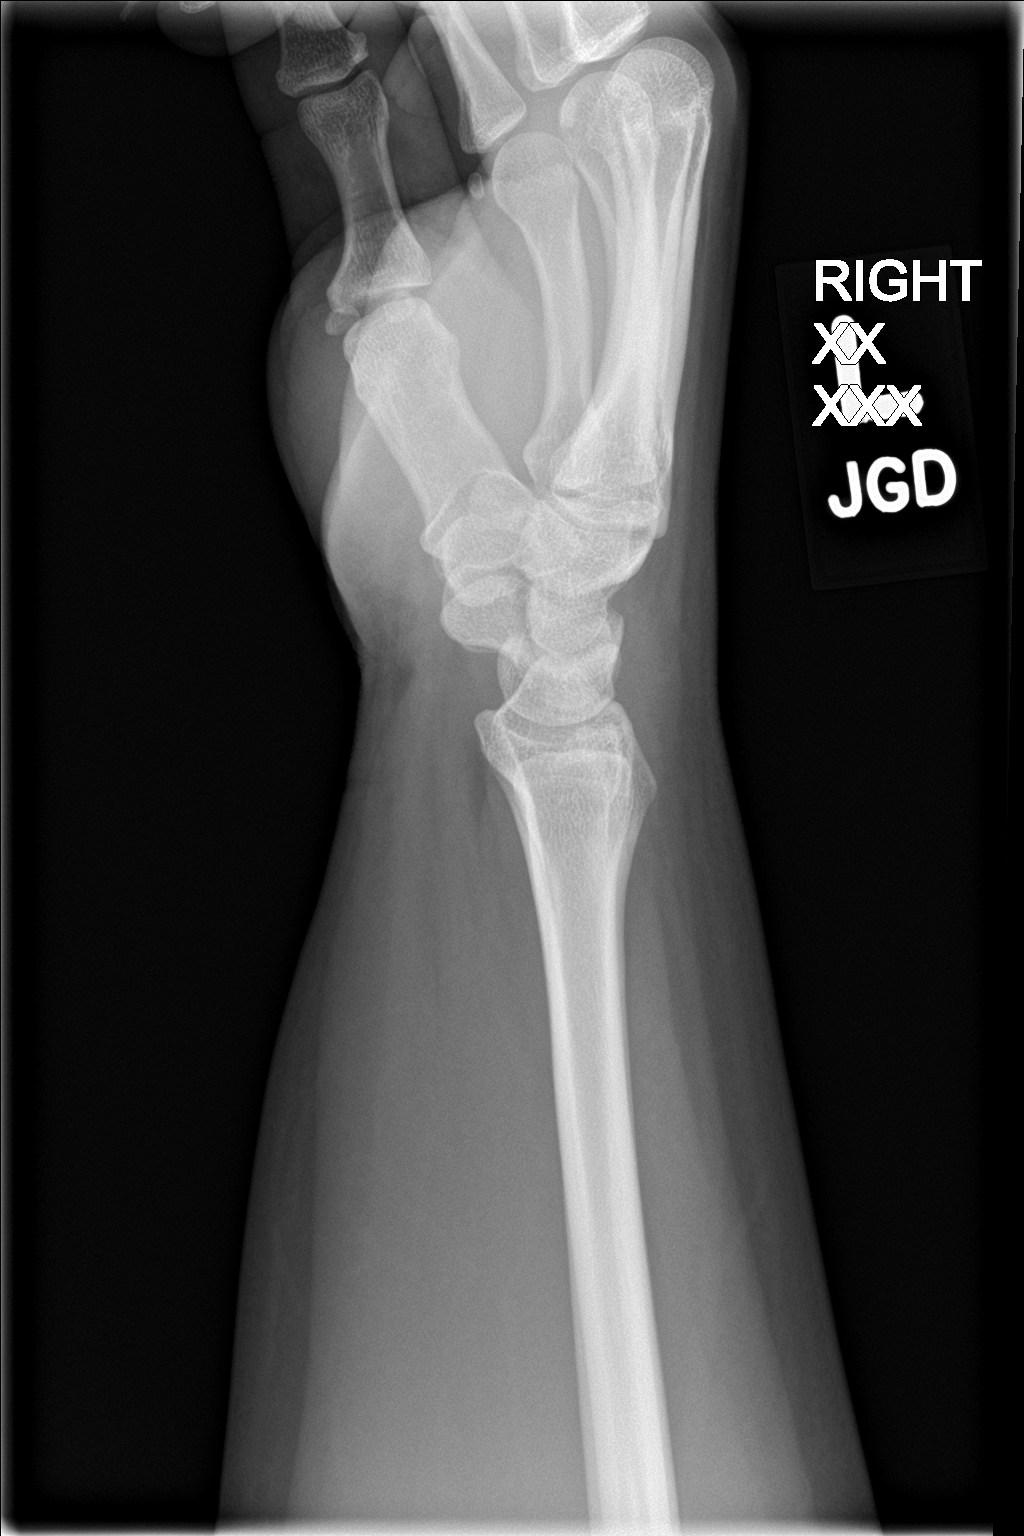

[4 of 4 positions shown; findings below may reference images not displayed]

FINDINGS: Punctate ossicle adjacent to the ulnar styloid process may represent
sequela of previous ulnar styloid avulsion injury versus a tiny area
of chondrocalcinosis. No definite acute fracture or dislocation.
Joint spaces are preserved. No erosions. Regional soft tissues
appear normal.
IMPRESSION: 1. No acute findings.
2. Punctate ossicle adjacent to the ulnar styloid process may
represent sequela of previous ulnar styloid avulsion injury
(favored) versus a tiny area of chondrocalcinosis.

## 2021-02-23 MED ORDER — IBUPROFEN 800 MG PO TABS: 800.0000 mg | ORAL_TABLET | Freq: Three times a day (TID) | ORAL | 0 refills | Status: DC

## 2021-02-23 NOTE — ED Triage Notes (Signed)
Pt present right wrist pain. Pt states she hurt her wrist but do not know how it happen and would like to get it checked out.

## 2021-02-23 NOTE — ED Provider Notes (Signed)
[facility]    CSN: [id number] Arrival date & time: 02/23/21  1142      History   Chief Complaint Chief Complaint  Patient presents with  . Wrist Injury    HPI Michele Avila is a 30 y.o. female.   HPI  Right Wrist Pain: Pt states that yesterday her right wrist started hurting.  No known recent injury although she states she was doing a lot of pushing and pulling at work with equipment yesterday.  She states that she has significant pain when she tries to rotate her wrist from side to side but is able to move her wrist in flexion and extension movement without pain.  She has not noticed any swelling, skin color changes or decreased neurological function.  Of note she did break this wrist a few months ago  Past Medical History:  Diagnosis Date  . Anemia   . Asthma    hx chronic bronchitis/exercise induced asthma as pre teen only  . Environmental allergies   . Infection    UTI  . Migraine   . Seasonal allergies   . Wrist fracture, right     Patient Active Problem List   Diagnosis Date Noted  . Acute pain of left knee 05/24/2017  . Pain in right wrist 05/24/2017  . Mirena intrauterine device in place 03/02/2016    Past Surgical History:  Procedure Laterality Date  . CESAREAN SECTION  2008  . CESAREAN SECTION N/A 06/15/2013   Procedure: CESAREAN SECTION;  Surgeon: Allyn Kenner, DO;  Location: [facility];  Service: Obstetrics;  Laterality: N/A;  . DILATION AND CURETTAGE OF UTERUS  08/2011  . FOOT SURGERY  2017  . INDUCED ABORTION      OB History    Gravida  5   Para  2   Term  2   Preterm      AB  3   Living  2     SAB      IAB  2   Ectopic      Multiple      Live Births  1            Home Medications    Prior to Admission medications   Medication Sig Start Date End Date Taking? Authorizing Provider  ibuprofen (ADVIL) 800 MG tablet Take 1 tablet (800 mg total) by mouth 3 (three) times daily. 02/23/21  Yes Onnie Hatchel M, PA-C   albuterol (VENTOLIN HFA) 108 (90 Base) MCG/ACT inhaler Inhale 1-2 puffs into the lungs every 6 (six) hours as needed for wheezing or shortness of breath. 06/06/20   Loura Halt A, NP  hydrocortisone (ANUSOL-HC) 2.5 % rectal cream Place 1 application rectally 2 (two) times daily. 12/18/20   Hughie Closs, PA-C  Levonorgestrel (MIRENA, 52 MG, IU) 1 each by Intrauterine route once.     [provider]  ondansetron (ZOFRAN-ODT) 4 MG disintegrating tablet Take 1 tablet (4 mg total) by mouth every 8 (eight) hours as needed for nausea or vomiting. 01/20/21   Tomi Likens, Adam R, DO  Rimegepant Sulfate (NURTEC) 75 MG TBDP Take 75 mg by mouth every other day. 01/20/21   Pieter Partridge, DO  Spacer/Aero-Holding Chambers DEVI Use spacer as directed with albuterol metered-dose inhaler 06/08/20   Jacqulyn Cane, MD  SUMAtriptan (IMITREX) 100 MG tablet TAKE 1 TABLET(100 MG) BY MOUTH DAILY AS NEEDED FOR MIGRAINE OR HEADACHE 01/20/21   Tomi Likens, Adam R, DO  cetirizine (ZYRTEC) 10 MG tablet Take 1  tablet (10 mg total) by mouth daily. 06/06/20 12/18/20  Loura Halt A, NP  esomeprazole (NEXIUM) 40 MG capsule Take 1 capsule (40 mg total) by mouth daily. Patient not taking: Reported on 09/16/2019 07/12/19 10/12/19  Vanessa Kick, MD  fluticasone [facility]) 50 MCG/ACT nasal spray Place 1-2 sprays into both nostrils daily for 7 days. 06/06/20 12/18/20  Loura Halt A, NP  loratadine (CLARITIN) 10 MG tablet Take 1 tablet (10 mg total) by mouth daily. Patient not taking: Reported on 12/03/2019 10/12/19 01/16/20  Wieters, Elesa Hacker, PA-C  promethazine (PHENERGAN) 25 MG suppository Place 1 suppository (25 mg total) rectally every 6 (six) hours as needed for nausea or vomiting. Patient not taking: Reported on 04/19/2020 12/17/19 06/06/20  Tacy Learn, PA-C    Family History Family History  Problem Relation Age of Onset  . Cancer Father        liver  . Diabetes Father   . Hypertension Father   . Hypertension Mother   .  Hypertension Maternal Grandmother   . Kidney disease Maternal Grandmother   . Diabetes Paternal Grandmother   . Hypertension Paternal Grandmother   . Ulcers Brother   . Crohn's disease Brother     Social History Social History   Tobacco Use  . Smoking status: Current Every Day Smoker    Packs/day: 0.50    Types: Cigarettes  . Smokeless tobacco: Never Used  . Tobacco comment: down to 5 cigarrettes/day  Vaping Use  . Vaping Use: Never used  Substance Use Topics  . Alcohol use: Yes    Comment: occassionally   . Drug use: Yes    Types: Marijuana     Allergies   [name] [camphor-menthol], Yellow dyes (non-tartrazine), and Latex   Review of Systems Review of Systems  As stated above in HPI Physical Exam Triage Vital Signs ED Triage Vitals  Enc Vitals Group     BP 02/23/21 1245 118/80     Pulse Rate 02/23/21 1245 92     Resp 02/23/21 1245 18     Temp 02/23/21 1245 98.6 F (37 C)     Temp Source 02/23/21 1245 Oral     SpO2 02/23/21 1245 100 %     Weight --      Height --      Head Circumference --      Peak Flow --      Pain Score 02/23/21 1243 5     Pain Loc --      Pain Edu? --      Excl. in [place]? --    No data found.  Updated Vital Signs BP 118/80 (BP Location: Right Arm)   Pulse 92   Temp 98.6 F (37 C) (Oral)   Resp 18   SpO2 100%   Physical Exam Vitals and nursing note reviewed.  Musculoskeletal:        General: Tenderness (right lateral wrist) present. No swelling or deformity.     Comments: Tenderness to side to sided movements of the right wrist. Normal flexion and extension. Negative fracture testing throughout.   Skin:    General: Skin is warm.     Capillary Refill: Capillary refill takes less than 2 seconds.     Coloration: Skin is not jaundiced or pale.     Findings: No bruising, erythema, lesion or rash.  Neurological:     General: No focal deficit present.     Sensory: No sensory deficit.     Motor: No weakness.  Coordination:  Coordination normal.      UC Treatments / Results  Labs (all labs ordered are listed, but only abnormal results are displayed) Labs Reviewed - No data to display  EKG   Radiology No results found.  Procedures Procedures (including critical care time)  Medications Ordered in UC Medications - No data to display  Initial Impression / Assessment and Plan / UC Course  I have reviewed the triage vital signs and the nursing notes.  Pertinent labs & imaging results that were available during my care of the patient were reviewed by me and considered in my medical decision making (see chart for details).     X ray finding appears to be related to healing previous injury. Harriet Pho discussed with patient. Wrist brace ordered and she will wear this for at least 2 weeks. Cold compress as needed. If symptoms worsen or fail to resolve within 2 weeks I would recommend orthopedic evaluation.     Final Clinical Impressions(s) / UC Diagnoses   Final diagnoses:  De Quervain's tenosynovitis, right   Discharge Instructions   None    ED Prescriptions    Medication Sig Dispense Auth. Provider   ibuprofen (ADVIL) 800 MG tablet Take 1 tablet (800 mg total) by mouth 3 (three) times daily. 21 tablet Hughie Closs, [place]     PDMP not reviewed this encounter.   Hughie Closs, [place] 02/23/21 1352

## 2021-03-23 DIAGNOSIS — U071 COVID-19: Secondary | ICD-10-CM

## 2021-03-23 HISTORY — DX: COVID-19: U07.1

## 2021-03-27 ENCOUNTER — Other Ambulatory Visit: Payer: Self-pay

## 2021-03-27 ENCOUNTER — Ambulatory Visit (HOSPITAL_COMMUNITY)
Admission: RE | Admit: 2021-03-27 | Discharge: 2021-03-27 | Disposition: A | Discharge status: Home / Self Care | Payer: Medicaid Other | Source: Ambulatory Visit | Attending: Family Medicine | Admitting: Family Medicine

## 2021-03-27 ENCOUNTER — Encounter (HOSPITAL_COMMUNITY): Payer: Self-pay

## 2021-03-27 VITALS — BP 135/86 | HR 90 | Temp 99.3°F | Resp 18

## 2021-03-27 DIAGNOSIS — R062 Wheezing: Secondary | ICD-10-CM | POA: Insufficient documentation

## 2021-03-27 DIAGNOSIS — R059 Cough, unspecified: Secondary | ICD-10-CM | POA: Diagnosis present

## 2021-03-27 DIAGNOSIS — F17210 Nicotine dependence, cigarettes, uncomplicated: Secondary | ICD-10-CM | POA: Insufficient documentation

## 2021-03-27 DIAGNOSIS — U071 COVID-19: Secondary | ICD-10-CM | POA: Insufficient documentation

## 2021-03-27 DIAGNOSIS — J069 Acute upper respiratory infection, unspecified: Secondary | ICD-10-CM | POA: Diagnosis not present

## 2021-03-27 MED ORDER — ALBUTEROL SULFATE HFA 108 (90 BASE) MCG/ACT IN AERS: 1.0000 | INHALATION_SPRAY | Freq: Four times a day (QID) | RESPIRATORY_TRACT | 1 refills | Status: DC | PRN

## 2021-03-27 NOTE — ED Provider Notes (Signed)
[place]   [id number] 03/27/21 Arrival Time: 4270  ASSESSMENT & PLAN:  1. Viral URI with cough   2. Wheezing    COVID-19 testing sent. OTC symptom care as needed.  Meds ordered this encounter  Medications  . albuterol (VENTOLIN HFA) 108 (90 Base) MCG/ACT inhaler    Sig: Inhale 1-2 puffs into the lungs every 6 (six) hours as needed for wheezing or shortness of breath.    Dispense:  1 each    Refill:  1     Follow-up Information    [facility].   Specialty: Urgent Care Why: As needed. Contact information: [address] [place] [phone]              Reviewed expectations re: course of current medical issues. Questions answered. Outlined signs and symptoms indicating need for more acute intervention. Understanding verbalized. After Visit Summary given.   SUBJECTIVE: History from: patient. Michele Avila is a 30 y.o. female who presents with worries regarding COVID-19. Known COVID-19 contact: unknown. Recent travel: none. Reports: nasal cong/cough/wheezing; few days. Denies: fever. Normal PO intake without n/v/d. Home COVID test positive.  OBJECTIVE:  Vitals:   03/27/21 1604  BP: 135/86  Pulse: 90  Resp: 18  Temp: 99.3 F (37.4 C)  TempSrc: Oral  SpO2: 97%    General appearance: alert; no distress Eyes: PERRLA; EOMI; conjunctiva normal HENT: [place]; AT; with nasal congestion Neck: supple  Lungs: speaks full sentences without difficulty; unlabored; mild wheezing Extremities: no edema Skin: warm and dry Neurologic: normal gait Psychological: alert and cooperative; normal mood and affect    Allergies  Allergen Reactions  . [name] [Camphor-Menthol] Swelling    SEVERE FACIAL SWELLING  . Yellow Dyes (Non-Tartrazine)     migraines  . Latex Hives and Rash    Past Medical History:  Diagnosis Date  . Anemia   . Asthma    hx chronic bronchitis/exercise induced asthma as pre teen  only  . Environmental allergies   . Infection    UTI  . Migraine   . Seasonal allergies   . Wrist fracture, right    Social History   Socioeconomic History  . Marital status: Significant Other    Spouse name: Not on file  . Number of children: 1  . Years of education: Not on file  . Highest education level: Some college, no degree  Occupational History    Employer: Materials engineer: [employer]  Tobacco Use  . Smoking status: Current Every Day Smoker    Packs/day: 0.50    Types: Cigarettes  . Smokeless tobacco: Never Used  . Tobacco comment: down to 5 cigarrettes/day  Vaping Use  . Vaping Use: Never used  Substance and Sexual Activity  . Alcohol use: Yes    Comment: occassionally   . Drug use: Yes    Types: Marijuana  . Sexual activity: Yes    Birth control/protection: I.U.D.  Other Topics Concern  . Not on file  Social History Narrative   Patient is right-handed. She lives with her boyfriend and son in a 2 story house. She drinks one beverage with caffeine 2x week. She is acitive at work, loading and unloading trucks.   Social Determinants of Health   Financial Resource Strain: Not on file  Food Insecurity: Not on file  Transportation Needs: Not on file  Physical Activity: Not on file  Stress: Not on file  Social Connections: Not  on file  Intimate Partner Violence: Not on file   Family History  Problem Relation Age of Onset  . Cancer Father        liver  . Diabetes Father   . Hypertension Father   . Hypertension Mother   . Hypertension Maternal Grandmother   . Kidney disease Maternal Grandmother   . Diabetes Paternal Grandmother   . Hypertension Paternal Grandmother   . Ulcers Brother   . Crohn's disease Brother    Past Surgical History:  Procedure Laterality Date  . CESAREAN SECTION  2008  . CESAREAN SECTION N/A 06/15/2013   Procedure: CESAREAN SECTION;  Surgeon: Allyn Kenner, DO;  Location: [facility];  Service: Obstetrics;  Laterality: N/A;  .  DILATION AND CURETTAGE OF UTERUS  08/2011  . FOOT SURGERY  2017  . INDUCED ABORTION       Vanessa Kick, MD 03/27/21 [phone]

## 2021-03-27 NOTE — Discharge Instructions (Addendum)
You have been tested for COVID-19 today. °If your test returns positive, you will receive a phone call from [facility] regarding your results. °Negative test results are not called. °Both positive and negative results area always visible on MyChart. °If you do not have a MyChart account, sign up instructions are provided in your discharge papers. °Please do not hesitate to contact us should you have questions or concerns. ° °

## 2021-03-27 NOTE — ED Triage Notes (Signed)
Pt c/o cough, sore throat and fever X 2 days.  Pt states she took an at home test that is positive.

## 2021-03-28 LAB — SARS CORONAVIRUS 2 (TAT 6-24 HRS): SARS Coronavirus 2: POSITIVE — AB

## 2021-03-29 ENCOUNTER — Telehealth: Payer: Self-pay | Admitting: Unknown Physician Specialty

## 2021-03-29 MED ORDER — MOLNUPIRAVIR EUA 200MG CAPSULE: 4.0000 | ORAL_CAPSULE | Freq: Two times a day (BID) | ORAL | 0 refills | Status: AC

## 2021-03-29 NOTE — Telephone Encounter (Signed)
Outpatient Oral COVID Treatment Note  I connected with Michele Avila on 03/29/2021/12:12 PM by telephone and verified that I am speaking with the correct person using two identifiers.  I discussed the limitations, risks, security, and privacy concerns of performing an evaluation and management service by telephone and the availability of in person appointments. I also discussed with the patient that there may be a patient responsible charge related to this service. The patient expressed understanding and agreed to proceed.  Patient location: home Provider location: home  Diagnosis: COVID-19 infection  Purpose of visit: Discussion of potential use of Molnupiravir or Paxlovid, a new treatment for mild to moderate COVID-19 viral infection in non-hospitalized patients.   Subjective: Patient is a 30 y.o. female who has been diagnosed with COVID 19 viral infection.  Their symptoms began on 5/3 with cough.    Past Medical History:  Diagnosis Date  . Anemia   . Asthma    hx chronic bronchitis/exercise induced asthma as pre teen only  . Environmental allergies   . Infection    UTI  . Migraine   . Seasonal allergies   . Wrist fracture, right     Allergies  Allergen Reactions  . [name] [Camphor-Menthol] Swelling    SEVERE FACIAL SWELLING  . Yellow Dyes (Non-Tartrazine)     migraines  . Latex Hives and Rash     Current Outpatient Medications:  .  albuterol (VENTOLIN HFA) 108 (90 Base) MCG/ACT inhaler, Inhale 1-2 puffs into the lungs every 6 (six) hours as needed for wheezing or shortness of breath., Disp: 1 each, Rfl: 1 .  hydrocortisone (ANUSOL-HC) 2.5 % rectal cream, Place 1 application rectally 2 (two) times daily., Disp: 30 g, Rfl: 0 .  ibuprofen (ADVIL) 800 MG tablet, Take 1 tablet (800 mg total) by mouth 3 (three) times daily., Disp: 21 tablet, Rfl: 0 .  Levonorgestrel (MIRENA, 52 MG, IU), 1 each by Intrauterine route once. , Disp: , Rfl:  .  ondansetron (ZOFRAN-ODT) 4 MG  disintegrating tablet, Take 1 tablet (4 mg total) by mouth every 8 (eight) hours as needed for nausea or vomiting., Disp: 20 tablet, Rfl: 3 .  Rimegepant Sulfate (NURTEC) 75 MG TBDP, Take 75 mg by mouth every other day., Disp: 16 tablet, Rfl: 5 .  Spacer/Aero-Holding Chambers DEVI, Use spacer as directed with albuterol metered-dose inhaler, Disp: 1 Product, Rfl: 1 .  SUMAtriptan (IMITREX) 100 MG tablet, TAKE 1 TABLET(100 MG) BY MOUTH DAILY AS NEEDED FOR MIGRAINE OR HEADACHE, Disp: 10 tablet, Rfl: 3  Objective: Patient appears/sounds congested.  They are in no apparent distress.  Breathing is non labored.  Mood and behavior are normal.  Laboratory Data:  Recent Results (from the past 2160 hour(s))  SARS CORONAVIRUS 2 (TAT 6-24 HRS) Nasopharyngeal Nasopharyngeal Swab     Status: Abnormal   Collection Time: 03/27/21  4:33 PM   Specimen: Nasopharyngeal Swab  Result Value Ref Range   SARS Coronavirus 2 POSITIVE (A) NEGATIVE    Comment: (NOTE) SARS-CoV-2 target nucleic acids are DETECTED.  The SARS-CoV-2 RNA is generally detectable in upper and lower respiratory specimens during the acute phase of infection. Positive results are indicative of the presence of SARS-CoV-2 RNA. Clinical correlation with patient history and other diagnostic information is  necessary to determine patient infection status. Positive results do not rule out bacterial infection or co-infection with other viruses.  The expected result is Negative.  Fact Sheet for Patients: SugarRoll.be  Fact Sheet for Healthcare Providers: https://www.woods-mathews.com/  This  test is not yet approved or cleared by the [place] and  has been authorized for detection and/or diagnosis of SARS-CoV-2 by FDA under an Emergency Use Authorization (EUA). This EUA will remain  in effect (meaning this test can be used) for the duration of the COVID-19 declaration under Section 564(b)(1) of the  Act, 21 U. S.C. section 360bbb-3(b)(1), unless the authorization is terminated or revoked sooner.   Performed at [facility], [place] [address], [place], [place] [address]      Assessment: 30 y.o. female with mild/moderate COVID 19 viral infection diagnosed on 5/4 at high risk for progression to severe COVID 19.  Plan:  This patient is a 30 y.o. female that meets the following criteria for Emergency Use Authorization of: Molnupiravir  1. Age >18 yr 2. SARS-COV-2 positive test 3. Symptom onset < 5 days 4. Mild-to-moderate COVID disease with high risk for severe progression to hospitalization or death   I have spoken and communicated the following to the patient or parent/caregiver regarding: 1. Molnupiravir is an unapproved drug that is authorized for use under an Print production planner.  2. There are no adequate, approved, available products for the treatment of COVID-19 in adults who have mild-to-moderate COVID-19 and are at high risk for progressing to severe COVID-19, including hospitalization or death. 3. Other therapeutics are currently authorized. For additional information on all products authorized for treatment or prevention of COVID-19, please see TanEmporium.pl.  4. There are benefits and risks of taking this treatment as outlined in the "Fact Sheet for Patients and Caregivers."  5. "Fact Sheet for Patients and Caregivers" was reviewed with patient. A hard copy will be provided to patient from pharmacy prior to the patient receiving treatment. 6. Patients should continue to self-isolate and use infection control measures (e.g., wear mask, isolate, social distance, avoid sharing personal items, clean and disinfect "high touch" surfaces, and frequent handwashing) according to CDC guidelines.  7. The patient or parent/caregiver has the option to accept or refuse  treatment. 8. [place] has established a pregnancy surveillance program. 9. Females of childbearing potential should use a reliable method of contraception correctly and consistently, as applicable, for the duration of treatment and for 4 days after the last dose of Molnupiravir. 18. Males of reproductive potential who are sexually active with females of childbearing potential should use a reliable method of contraception correctly and consistently during treatment and for at least 3 months after the last dose. 11. Pregnancy status and risk was assessed. Patient verbalized understanding of precautions.   After reviewing above information with the patient, the patient agrees to receive molnupiravir.  Follow up instructions:    . Take prescription BID x 5 days as directed . Reach out to pharmacist for counseling on medication if desired . For concerns regarding further COVID symptoms please follow up with your PCP or urgent care . For urgent or life-threatening issues, seek care at your local emergency department  The patient was provided an opportunity to ask questions, and all were answered. The patient agreed with the plan and demonstrated an understanding of the instructions.   Script sent to [facility]  The patient was advised to call their PCP or seek an in-person evaluation if the symptoms worsen or if the condition fails to improve as anticipated.   I provided 15 minutes of non face-to-face telephone visit time during this encounter, and > 50% was spent counseling as documented under my assessment & plan.  Kathrine Haddock, NP 03/29/2021 /12:12 PM  Unable to rx Pax due to no labs

## 2021-04-01 ENCOUNTER — Telehealth (HOSPITAL_COMMUNITY): Payer: Self-pay

## 2021-04-01 ENCOUNTER — Other Ambulatory Visit: Payer: Self-pay | Admitting: Physician Assistant

## 2021-04-01 ENCOUNTER — Ambulatory Visit: Payer: Medicaid Other

## 2021-04-01 NOTE — Progress Notes (Signed)
error 

## 2021-04-01 NOTE — Telephone Encounter (Signed)
Spoke with patient regarding infusion - day miscalculation and she is outside of the infusion window at this time.  She is taking Molnupirivir as prescribed and stated she is beginning to feel better now.

## 2021-04-29 NOTE — Progress Notes (Unsigned)
Wait for Determination Please wait for [employer] 2017 NCPDP to return a determination.[employer] is reviewing your PA request. Typically an electronic response will be received within 24-72 hours. To check for an update later, open this request from your dashboard.  You may close this dialog and return to your dashboard to perform other tasks.

## 2021-05-04 ENCOUNTER — Encounter (HOSPITAL_COMMUNITY): Payer: Self-pay | Admitting: Pharmacy Technician

## 2021-05-04 ENCOUNTER — Emergency Department (HOSPITAL_COMMUNITY): Payer: Medicaid Other

## 2021-05-04 ENCOUNTER — Emergency Department (HOSPITAL_COMMUNITY)
Admission: EM | Admit: 2021-05-04 | Discharge: 2021-05-05 | Disposition: A | Discharge status: Home / Self Care | Payer: Medicaid Other | Attending: Emergency Medicine | Admitting: Emergency Medicine

## 2021-05-04 ENCOUNTER — Other Ambulatory Visit: Payer: Self-pay

## 2021-05-04 DIAGNOSIS — Z7951 Long term (current) use of inhaled steroids: Secondary | ICD-10-CM | POA: Diagnosis not present

## 2021-05-04 DIAGNOSIS — R519 Headache, unspecified: Secondary | ICD-10-CM | POA: Diagnosis not present

## 2021-05-04 DIAGNOSIS — J45909 Unspecified asthma, uncomplicated: Secondary | ICD-10-CM | POA: Insufficient documentation

## 2021-05-04 DIAGNOSIS — F17210 Nicotine dependence, cigarettes, uncomplicated: Secondary | ICD-10-CM | POA: Diagnosis not present

## 2021-05-04 DIAGNOSIS — G43909 Migraine, unspecified, not intractable, without status migrainosus: Secondary | ICD-10-CM

## 2021-05-04 DIAGNOSIS — Z91040 Latex allergy status: Secondary | ICD-10-CM | POA: Diagnosis not present

## 2021-05-04 DIAGNOSIS — H538 Other visual disturbances: Secondary | ICD-10-CM | POA: Diagnosis not present

## 2021-05-04 DIAGNOSIS — H539 Unspecified visual disturbance: Secondary | ICD-10-CM

## 2021-05-04 LAB — CBC WITH DIFFERENTIAL/PLATELET
Abs Immature Granulocytes: 0.03 10*3/uL (ref 0.00–0.07)
Basophils Absolute: 0 10*3/uL (ref 0.0–0.1)
Basophils Relative: 0 %
Eosinophils Absolute: 0.2 10*3/uL (ref 0.0–0.5)
Eosinophils Relative: 2 %
HCT: 46.7 % — ABNORMAL HIGH (ref 36.0–46.0)
Hemoglobin: 15.4 g/dL — ABNORMAL HIGH (ref 12.0–15.0)
Immature Granulocytes: 0 %
Lymphocytes Relative: 27 %
Lymphs Abs: 2.6 10*3/uL (ref 0.7–4.0)
MCH: 30.7 pg (ref 26.0–34.0)
MCHC: 33 g/dL (ref 30.0–36.0)
MCV: 93 fL (ref 80.0–100.0)
Monocytes Absolute: 0.5 10*3/uL (ref 0.1–1.0)
Monocytes Relative: 5 %
Neutro Abs: 6.1 10*3/uL (ref 1.7–7.7)
Neutrophils Relative %: 66 %
Platelets: 275 10*3/uL (ref 150–400)
RBC: 5.02 MIL/uL (ref 3.87–5.11)
RDW: 12.3 % (ref 11.5–15.5)
WBC: 9.4 10*3/uL (ref 4.0–10.5)
nRBC: 0 % (ref 0.0–0.2)

## 2021-05-04 LAB — COMPREHENSIVE METABOLIC PANEL WITH GFR
ALT: 19 U/L (ref 0–44)
AST: 25 U/L (ref 15–41)
Albumin: 4.1 g/dL (ref 3.5–5.0)
Alkaline Phosphatase: 67 U/L (ref 38–126)
Anion gap: 6 (ref 5–15)
BUN: 9 mg/dL (ref 6–20)
CO2: 28 mmol/L (ref 22–32)
Calcium: 9.6 mg/dL (ref 8.9–10.3)
Chloride: 104 mmol/L (ref 98–111)
Creatinine, Ser: 0.78 mg/dL (ref 0.44–1.00)
GFR, Estimated: >60 mL/min
Glucose, Bld: 101 mg/dL — ABNORMAL HIGH (ref 70–99)
Potassium: 4 mmol/L (ref 3.5–5.1)
Sodium: 138 mmol/L (ref 135–145)
Total Bilirubin: 0.9 mg/dL (ref 0.3–1.2)
Total Protein: 7.2 g/dL (ref 6.5–8.1)

## 2021-05-04 LAB — I-STAT BETA HCG BLOOD, ED (MC, WL, AP ONLY): I-stat hCG, quantitative: <5 m[IU]/mL

## 2021-05-04 IMAGING — CT CT HEAD W/O CM
4 series · 17 of 47 positions shown, 19 images · non-contrast
Comparison: [DATE]

CLINICAL DATA: Headache.  Classic migraine.

EXAM:
CT HEAD WITHOUT CONTRAST
TECHNIQUE: Contiguous axial images were obtained from the base of the skull
through the vertex without intravenous contrast.

[Series 3: head without · axial · non-contrast · 0.46mm/px · z∈[-131,-11]mm · 7 of 33 slices shown, 9 images]
[im 5/33  brain]
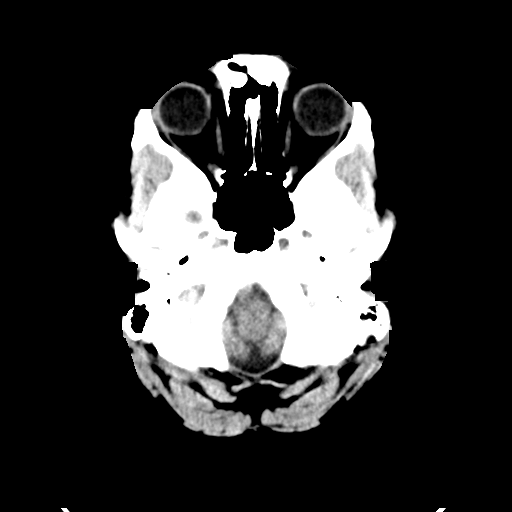
[im 5/33  bone]
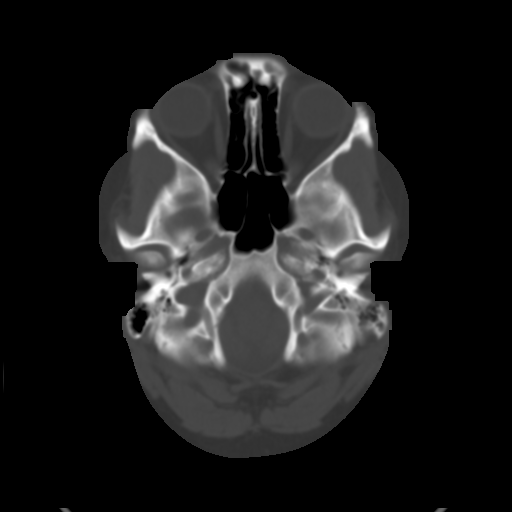
[im 9/33  brain]
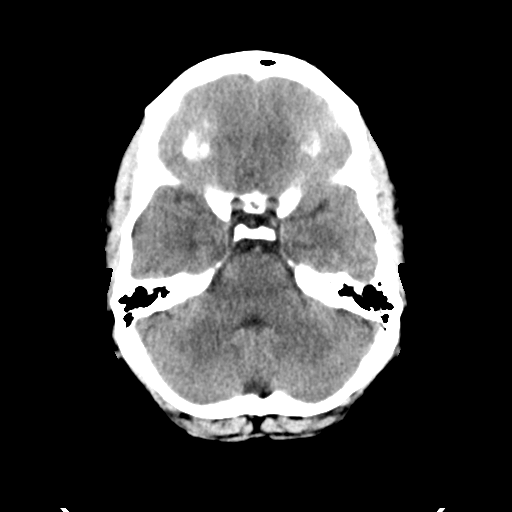
[im 13/33  brain]
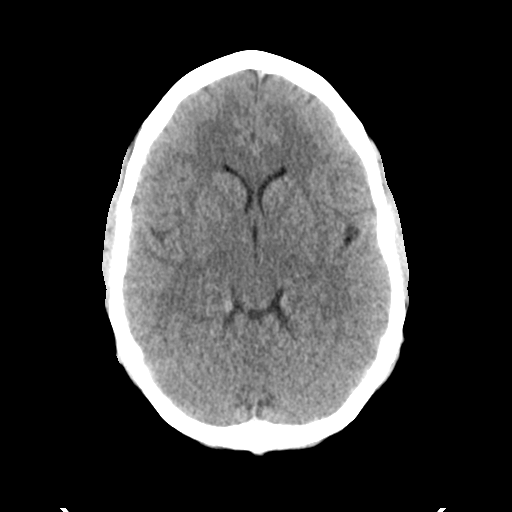
[im 17/33  brain]
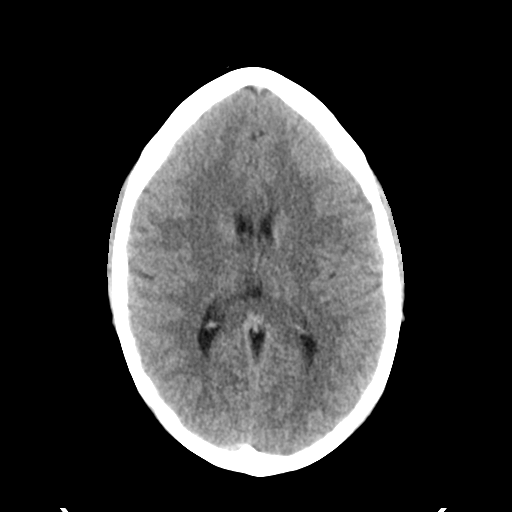
[im 21/33  brain]
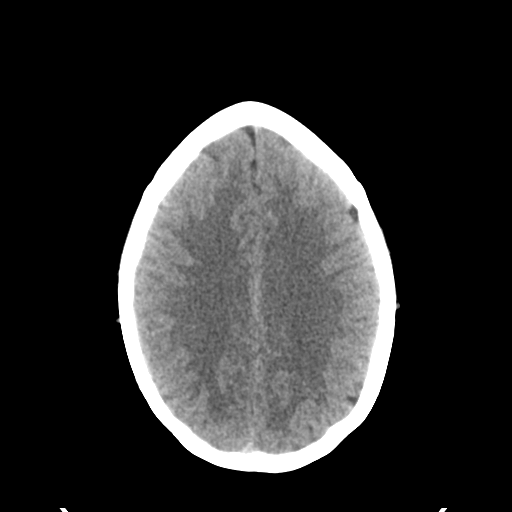
[im 21/33  bone]
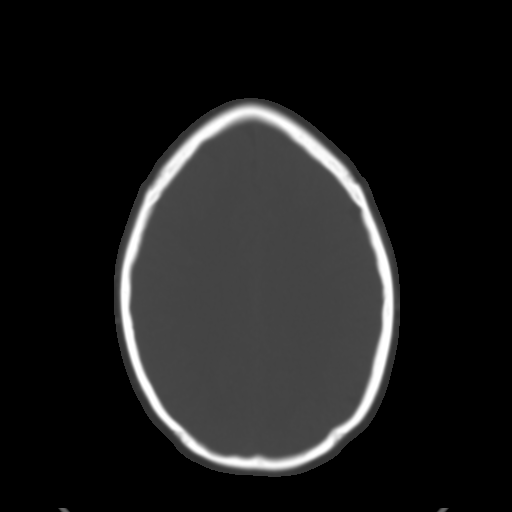
[im 25/33  brain]
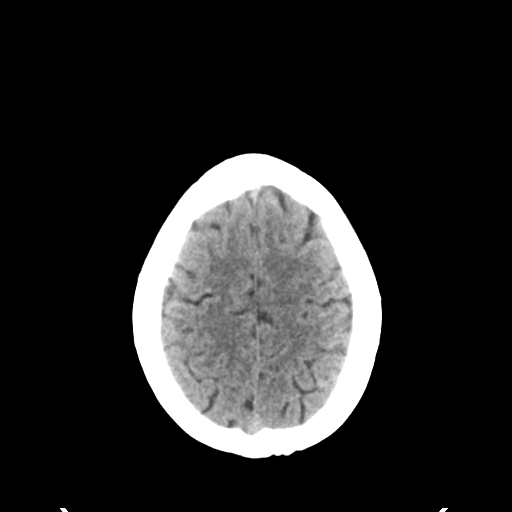
[im 29/33  brain]
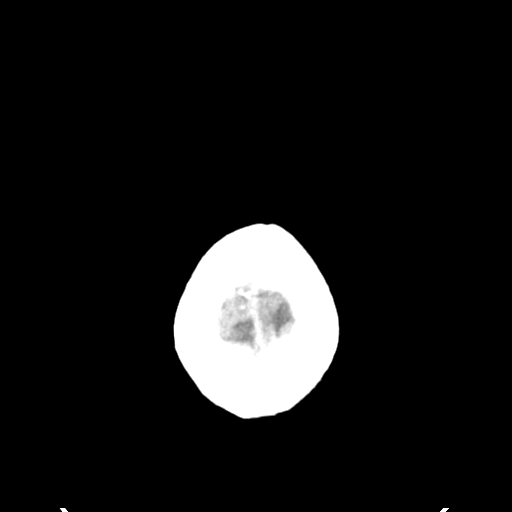

[Series 4: head bone · axial · 0.46mm/px · z∈[-135,-79]mm · 4 of 81 slices shown]
[im 9/81  bone]
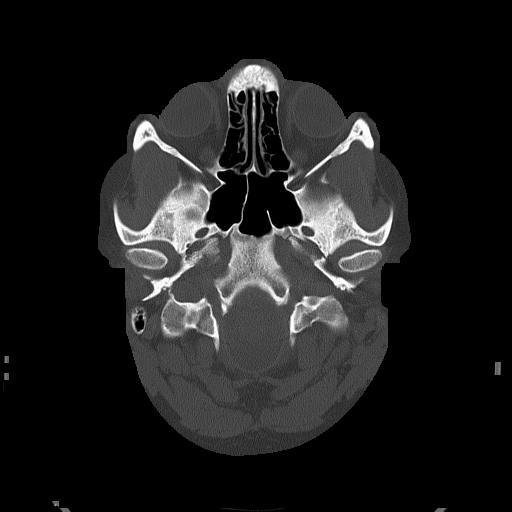
[im 17/81  bone]
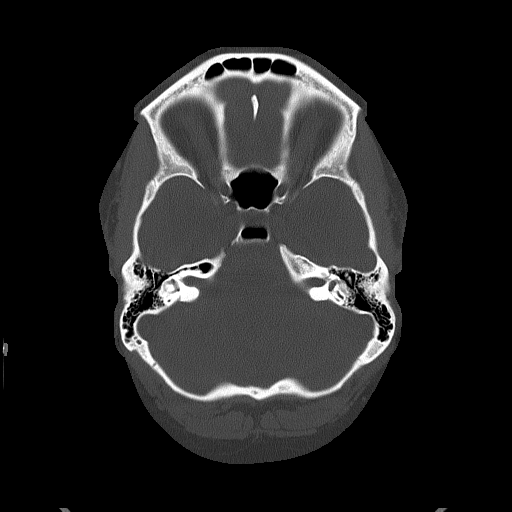
[im 25/81  bone]
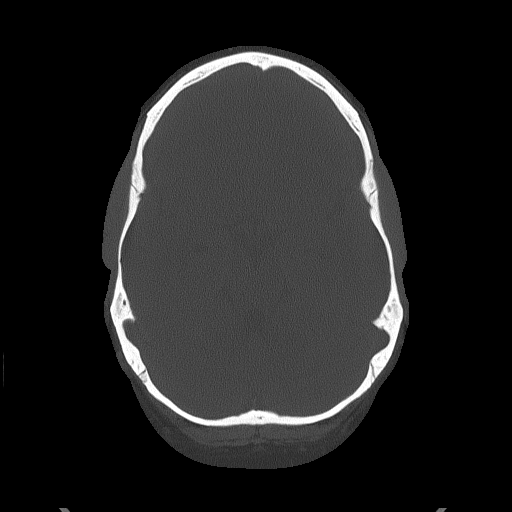
[im 37/81  bone]
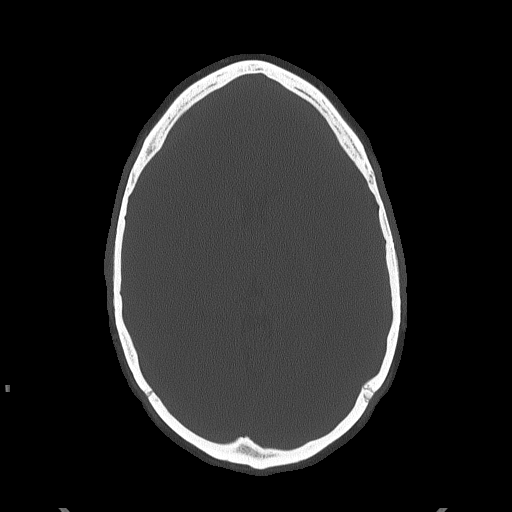

[Series 5: head without cor · coronal · non-contrast · 0.34mm/px · 3 of 67 slices shown]
[im 24/67  brain]
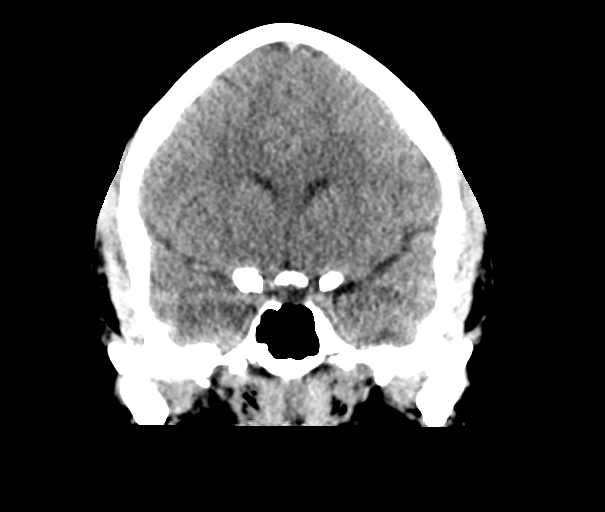
[im 30/67  brain]
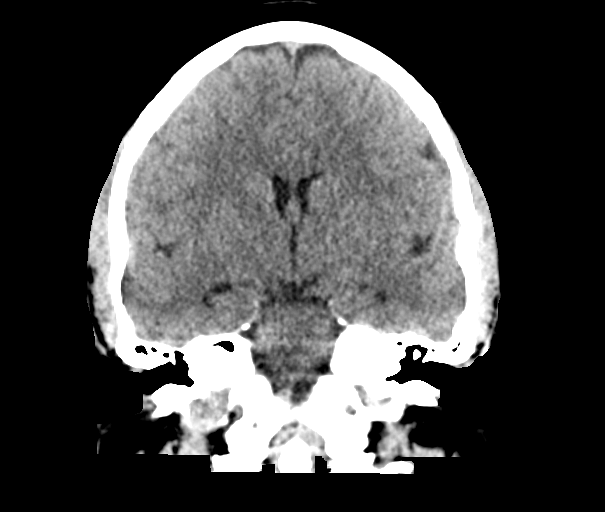
[im 37/67  brain]
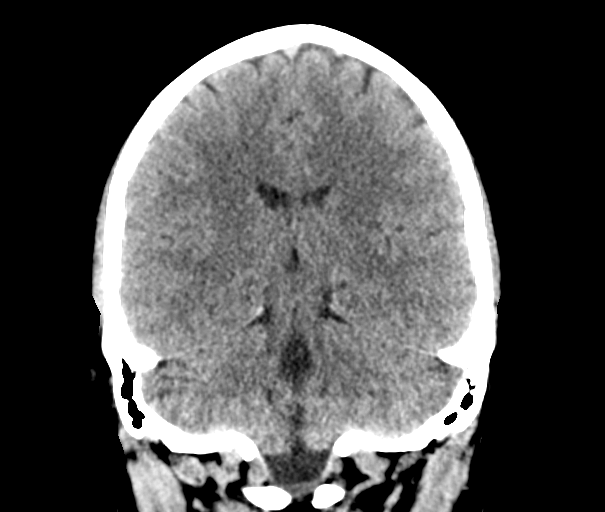

[Series 6: head without sag · sagittal · non-contrast · 0.37mm/px · 3 of 67 slices shown]
[im 23/67  brain]
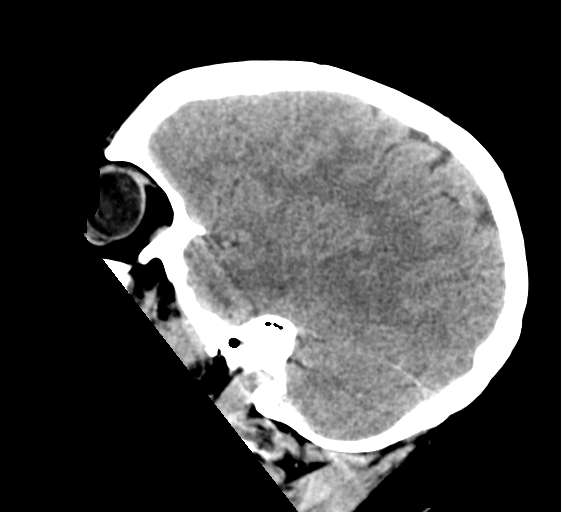
[im 34/67  brain]
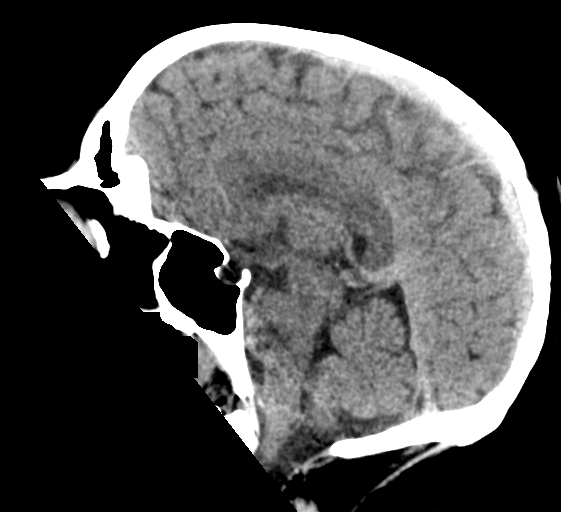
[im 45/67  brain]
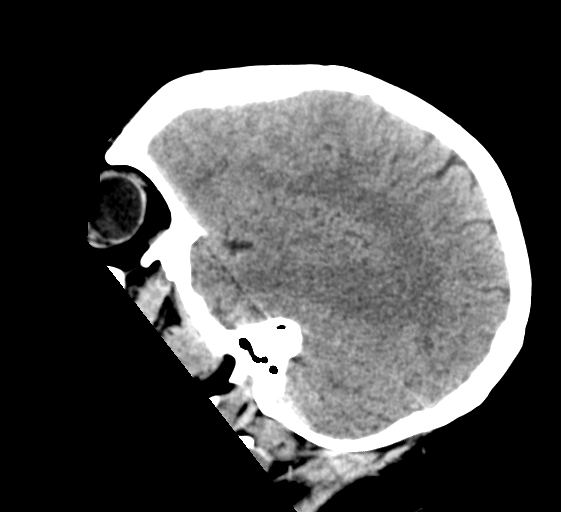

[17 of 47 positions shown; findings below may reference images not displayed]

FINDINGS: Brain: No evidence of acute infarction, hemorrhage, hydrocephalus,
extra-axial collection or mass lesion/mass effect.

Vascular: No hyperdense vessel or unexpected calcification.

Skull: Normal. Negative for fracture or focal lesion.

Sinuses/Orbits: No acute finding.

Other: None.
IMPRESSION: No acute intracranial abnormalities.

## 2021-05-04 MED ORDER — LORAZEPAM 2 MG/ML IJ SOLN
1.0000 mg | Freq: Once | INTRAMUSCULAR | Status: AC
  Administered 2021-05-05: 1 mg via INTRAVENOUS
  Filled 2021-05-04: qty 1

## 2021-05-04 MED ORDER — PROCHLORPERAZINE EDISYLATE 10 MG/2ML IJ SOLN: 10.0000 mg | Freq: Once | INTRAMUSCULAR | Status: DC

## 2021-05-04 MED ORDER — ONDANSETRON HCL 4 MG/2ML IJ SOLN
4.0000 mg | Freq: Once | INTRAMUSCULAR | Status: AC
  Administered 2021-05-04: 4 mg via INTRAVENOUS
  Filled 2021-05-04: qty 2

## 2021-05-04 MED ORDER — DIPHENHYDRAMINE HCL 50 MG/ML IJ SOLN
25.0000 mg | Freq: Once | INTRAMUSCULAR | Status: AC
  Administered 2021-05-04: 25 mg via INTRAVENOUS
  Filled 2021-05-04: qty 1

## 2021-05-04 MED ORDER — DIPHENHYDRAMINE HCL 50 MG/ML IJ SOLN: 25.0000 mg | Freq: Once | INTRAMUSCULAR | Status: DC

## 2021-05-04 MED ORDER — PROCHLORPERAZINE EDISYLATE 10 MG/2ML IJ SOLN
10.0000 mg | Freq: Once | INTRAMUSCULAR | Status: AC
  Administered 2021-05-04: 10 mg via INTRAVENOUS
  Filled 2021-05-04: qty 2

## 2021-05-04 MED ORDER — SODIUM CHLORIDE 0.9 % IV BOLUS
1000.0000 mL | Freq: Once | INTRAVENOUS | Status: AC
  Administered 2021-05-04: 1000 mL via INTRAVENOUS

## 2021-05-04 NOTE — ED Triage Notes (Signed)
Pt here with report of a migraine. Pt with hx of same. Pt states this migraine is different due to visual disturbance.

## 2021-05-04 NOTE — ED Notes (Signed)
Patient transported to CT 

## 2021-05-04 NOTE — ED Provider Notes (Signed)
[facility] EMERGENCY DEPARTMENT Provider Note   CSN: [id number] Arrival date & time: 05/04/21  1609     History Chief Complaint  Patient presents with   Migraine    Michele Avila is a 30 y.o. female who PMH/o migraine, anemia who presents for evaluation of migraine that began 3 days ago. Patient reports her headache started out mild in nature and progressively worsened.  She states this feels like her normal migraine.  She reports that she took her at home medications which had some improvement but she states the migraines still persisted.  She reports she went down to take a nap earlier this afternoon.  She woke up about 330 and states that when she woke up, she felt like she had a black spots in her vision in both eyes.  No vision loss.  She states that they are translucent and she can see through them but she "feels like spiders are in her vision."  She states she has never had this feature with her migraines before.  She reports associated nausea, photophobia.  No numbness/weakness of arms or legs.  No fevers, chest pain, difficulty breathing.  She does not wear contacts.  The history is provided by the patient.      Past Medical History:  Diagnosis Date   Anemia    Asthma    hx chronic bronchitis/exercise induced asthma as pre teen only   Environmental allergies    Infection    UTI   Migraine    Seasonal allergies    Wrist fracture, right     Patient Active Problem List   Diagnosis Date Noted   Acute pain of left knee 05/24/2017   Pain in right wrist 05/24/2017   Mirena intrauterine device in place 03/02/2016    Past Surgical History:  Procedure Laterality Date   CESAREAN SECTION  2008   CESAREAN SECTION N/A 06/15/2013   Procedure: CESAREAN SECTION;  Surgeon: Allyn Kenner, DO;  Location: [facility];  Service: Obstetrics;  Laterality: N/A;   DILATION AND CURETTAGE OF UTERUS  08/2011   FOOT SURGERY  2017   INDUCED ABORTION       OB History      Gravida  5   Para  2   Term  2   Preterm      AB  3   Living  2      SAB      IAB  2   Ectopic      Multiple      Live Births  1           Family History  Problem Relation Age of Onset   Cancer Father        liver   Diabetes Father    Hypertension Father    Hypertension Mother    Hypertension Maternal Grandmother    Kidney disease Maternal Grandmother    Diabetes Paternal Grandmother    Hypertension Paternal Grandmother    Ulcers Brother    Crohn's disease Brother     Social History   Tobacco Use   Smoking status: Every Day    Packs/day: 0.50    Pack years: 0.00    Types: Cigarettes   Smokeless tobacco: Never   Tobacco comments:    down to 5 cigarrettes/day  Vaping Use   Vaping Use: Never used  Substance Use Topics   Alcohol use: Yes    Comment: occassionally    Drug use: Yes  Types: Marijuana    Home Medications Prior to Admission medications   Medication Sig Start Date End Date Taking? Authorizing Provider  albuterol (VENTOLIN HFA) 108 (90 Base) MCG/ACT inhaler Inhale 1-2 puffs into the lungs every 6 (six) hours as needed for wheezing or shortness of breath. 03/27/21   Vanessa Kick, MD  hydrocortisone (ANUSOL-HC) 2.5 % rectal cream Place 1 application rectally 2 (two) times daily. 12/18/20   Hughie Closs, PA-C  ibuprofen (ADVIL) 800 MG tablet Take 1 tablet (800 mg total) by mouth 3 (three) times daily. 02/23/21   Hughie Closs, PA-C  Levonorgestrel (MIRENA, 52 MG, IU) 1 each by Intrauterine route once.     [provider]  ondansetron (ZOFRAN-ODT) 4 MG disintegrating tablet Take 1 tablet (4 mg total) by mouth every 8 (eight) hours as needed for nausea or vomiting. 01/20/21   Tomi Likens, Adam R, DO  Rimegepant Sulfate (NURTEC) 75 MG TBDP Take 75 mg by mouth every other day. 01/20/21   Pieter Partridge, DO  Spacer/Aero-Holding Chambers DEVI Use spacer as directed with albuterol metered-dose inhaler 06/08/20   Jacqulyn Cane, MD   SUMAtriptan (IMITREX) 100 MG tablet TAKE 1 TABLET(100 MG) BY MOUTH DAILY AS NEEDED FOR MIGRAINE OR HEADACHE 01/20/21   Pieter Partridge, DO  cetirizine (ZYRTEC) 10 MG tablet Take 1 tablet (10 mg total) by mouth daily. 06/06/20 12/18/20  Loura Halt A, NP  esomeprazole (NEXIUM) 40 MG capsule Take 1 capsule (40 mg total) by mouth daily. Patient not taking: Reported on 09/16/2019 07/12/19 10/12/19  Vanessa Kick, MD  fluticasone [facility]) 50 MCG/ACT nasal spray Place 1-2 sprays into both nostrils daily for 7 days. 06/06/20 12/18/20  Loura Halt A, NP  loratadine (CLARITIN) 10 MG tablet Take 1 tablet (10 mg total) by mouth daily. Patient not taking: Reported on 12/03/2019 10/12/19 01/16/20  Wieters, Elesa Hacker, PA-C  promethazine (PHENERGAN) 25 MG suppository Place 1 suppository (25 mg total) rectally every 6 (six) hours as needed for nausea or vomiting. Patient not taking: Reported on 04/19/2020 12/17/19 06/06/20  Suella Broad A, PA-C    Allergies    [name] [camphor-menthol], Yellow dyes (non-tartrazine), and Latex  Review of Systems   Review of Systems  Constitutional:  Negative for fever.  Eyes:  Positive for photophobia and visual disturbance.  Respiratory:  Negative for cough and shortness of breath.   Cardiovascular:  Negative for chest pain.  Gastrointestinal:  Positive for nausea. Negative for abdominal pain and vomiting.  Genitourinary:  Negative for dysuria and hematuria.  Neurological:  Positive for headaches. Negative for weakness and numbness.  All other systems reviewed and are negative.  Physical Exam Updated Vital Signs BP 107/78   Pulse (!) 59   Temp 98.5 F (36.9 C) (Oral)   Resp 17   SpO2 99%   Physical Exam Vitals and nursing note reviewed.  Constitutional:      Appearance: Normal appearance. She is well-developed.  HENT:     Head: Normocephalic and atraumatic.  Eyes:     General: Lids are normal.     Conjunctiva/sclera: Conjunctivae normal.     Pupils: Pupils are  equal, round, and reactive to light.     Comments: PERRL. EOMs intact. No nystagmus. No neglect. Can correctly identify my fingers. Peripheral vision intact. She reports that in both eyes she sees a few small black dots with squigly lines. She states that these are translucent and that seh can see through them.   Cardiovascular:  Rate and Rhythm: Normal rate and regular rhythm.     Pulses: Normal pulses.     Heart sounds: Normal heart sounds. No murmur heard.   No friction rub. No gallop.  Pulmonary:     Effort: Pulmonary effort is normal.     Breath sounds: Normal breath sounds.     Comments: Lungs clear to auscultation bilaterally.  Symmetric chest rise.  No wheezing, rales, rhonchi. Abdominal:     Palpations: Abdomen is soft. Abdomen is not rigid.     Tenderness: There is no abdominal tenderness. There is no guarding.  Musculoskeletal:        General: Normal range of motion.     Cervical back: Full passive range of motion without pain.  Skin:    General: Skin is warm and dry.     Capillary Refill: Capillary refill takes less than 2 seconds.  Neurological:     Mental Status: She is alert and oriented to person, place, and time.     Comments: Cranial nerves III-XII intact Follows commands, Moves all extremities  5/5 strength to BUE and BLE  Sensation intact throughout all major nerve distributions No slurred speech. No facial droop.   Psychiatric:        Speech: Speech normal.    ED Results / Procedures / Treatments   Labs (all labs ordered are listed, but only abnormal results are displayed) Labs Reviewed  CBC WITH DIFFERENTIAL/PLATELET - Abnormal; Notable for the following components:      Result Value   Hemoglobin 15.4 (*)    HCT 46.7 (*)    All other components within normal limits  COMPREHENSIVE METABOLIC PANEL - Abnormal; Notable for the following components:   Glucose, Bld 101 (*)    All other components within normal limits  I-STAT BETA HCG BLOOD, ED ([facility], [facility], [facility]  ONLY)    EKG None  Radiology CT Head Wo Contrast  Result Date: 05/04/2021 CLINICAL DATA:  Headache.  Classic migraine. EXAM: CT HEAD WITHOUT CONTRAST TECHNIQUE: Contiguous axial images were obtained from the base of the skull through the vertex without intravenous contrast. COMPARISON:  July 27, 2020 FINDINGS: Brain: No evidence of acute infarction, hemorrhage, hydrocephalus, extra-axial collection or mass lesion/mass effect. Vascular: No hyperdense vessel or unexpected calcification. Skull: Normal. Negative for fracture or focal lesion. Sinuses/Orbits: No acute finding. Other: None. IMPRESSION: No acute intracranial abnormalities. Electronically Signed   By: Dorise Bullion III M.D   On: 05/04/2021 19:44    Procedures Procedures   Medications Ordered in ED Medications  sodium chloride 0.9 % bolus 1,000 mL (0 mLs Intravenous Stopped 05/04/21 2302)  ondansetron (ZOFRAN) injection 4 mg (4 mg Intravenous Given 05/04/21 2008)  prochlorperazine (COMPAZINE) injection 10 mg (10 mg Intravenous Given 05/04/21 2025)  diphenhydrAMINE (BENADRYL) injection 25 mg (25 mg Intravenous Given 05/04/21 2024)    ED Course  I have reviewed the triage vital signs and the nursing notes.  Pertinent labs & imaging results that were available during my care of the patient were reviewed by me and considered in my medical decision making (see chart for details).    MDM Rules/Calculators/A&P                          30 year old female who presents for evaluation of migraine.  Reports that started 3 days ago.  Felt like similar migraines she has had in the past.  Today, went to take a nap and woke up and had  black spots in both eyes and her vision.  No vision loss.  No numbness/weakness.  Initially arrival, she is afebrile nontoxic-appearing.  Vital signs are stable.  On exam, she has no neurodeficits.  She states that when she looks in both eyes, she feels like there is black dots in her vision that almost look  like "a spider."  She is able to correctly identify how many fingers I am holding up.  EOMs intact.  Consider complex migraine.  Patient did not want migraine cocktail she states that is made her sick before.  Will check labs, CT head. This could represent complex migraine. Doubt that this is retinal detachment given that is occurring in both eyes.   Visual acuity: Right: 20/32, Left: 20/50. CT head negative for any acute abnormalities.  I-STAT beta negative.  CBC shows no leukocytosis.  Hemoglobin 15.4.  CMP is unremarkable.  I discussed results with patient.  She reports improvement in headache after migraine cocktail but is still having vision abnormalities.  Will consult neuro.  I discussed with Dr. Lorrin Goodell (Neuro) who recommends MRV to rule out dural venous thrombosis. If MRV unremarkable, patient can likely be d/c homed.   Patient signed out to [facility], PA-C pending MRV results.   Portions of this note were generated with Lobbyist. Dictation errors may occur despite best attempts at proofreading.  Final Clinical Impression(s) / ED Diagnoses Final diagnoses:  Migraine without status migrainosus, not intractable, unspecified migraine type  Vision abnormalities    Rx / DC Orders ED Discharge Orders     None        Desma Mcgregor 05/04/21 2306    Isla Pence, MD 05/07/21 [phone]

## 2021-05-04 NOTE — ED Provider Notes (Signed)
23:50: Assumed care of patient from Providence Lanius PA-C at change of shift pending MRV & disposition.   Please see prior provider note for full H&P. Briefly patient is a 30 yo female who presented to the ED with complaints of headache, overall similar to prior migraines, however is seeing black spots in her vision in both eyes which is atypical for her.   Case discussed with neurology- recommended MRV.  Plan at change of shift is to follow up on MRV & re-evaluate patient, if MRV reassuring can likely be discharged home.   MRV: Normal intracranial MRV. No evidence for dural sinus thrombosis  02:30: RE-EVAL: Patient feeling much better, ready to go home. I discussed results, treatment plan, need for follow-up with her neurology, strict and return precautions with the patient. Provided opportunity for questions, patient confirmed understanding and is in agreement with plan.    Results for orders placed or performed during the hospital encounter of 05/04/21  CBC with Differential  Result Value Ref Range   WBC 9.4 4.0 - 10.5 K/uL   RBC 5.02 3.87 - 5.11 MIL/uL   Hemoglobin 15.4 (H) 12.0 - 15.0 g/dL   HCT 46.7 (H) 36.0 - 46.0 %   MCV 93.0 80.0 - 100.0 fL   MCH 30.7 26.0 - 34.0 pg   MCHC 33.0 30.0 - 36.0 g/dL   RDW 12.3 11.5 - 15.5 %   Platelets 275 150 - 400 K/uL   nRBC 0.0 0.0 - 0.2 %   Neutrophils Relative % 66 %   Neutro Abs 6.1 1.7 - 7.7 K/uL   Lymphocytes Relative 27 %   Lymphs Abs 2.6 0.7 - 4.0 K/uL   Monocytes Relative 5 %   Monocytes Absolute 0.5 0.1 - 1.0 K/uL   Eosinophils Relative 2 %   Eosinophils Absolute 0.2 0.0 - 0.5 K/uL   Basophils Relative 0 %   Basophils Absolute 0.0 0.0 - 0.1 K/uL   Immature Granulocytes 0 %   Abs Immature Granulocytes 0.03 0.00 - 0.07 K/uL  Comprehensive metabolic panel  Result Value Ref Range   Sodium 138 135 - 145 mmol/L   Potassium 4.0 3.5 - 5.1 mmol/L   Chloride 104 98 - 111 mmol/L   CO2 28 22 - 32 mmol/L   Glucose, Bld 101 (H) 70 - 99  mg/dL   BUN 9 6 - 20 mg/dL   Creatinine, Ser 0.78 0.44 - 1.00 mg/dL   Calcium 9.6 8.9 - 10.3 mg/dL   Total Protein 7.2 6.5 - 8.1 g/dL   Albumin 4.1 3.5 - 5.0 g/dL   AST 25 15 - 41 U/L   ALT 19 0 - 44 U/L   Alkaline Phosphatase 67 38 - 126 U/L   Total Bilirubin 0.9 0.3 - 1.2 mg/dL   GFR, Estimated >60 >60 mL/min   Anion gap 6 5 - 15  I-Stat beta hCG blood, ED  Result Value Ref Range   I-stat hCG, quantitative <5.0 <5 mIU/mL   Comment 3           CT Head Wo Contrast  Result Date: 05/04/2021 CLINICAL DATA:  Headache.  Classic migraine. EXAM: CT HEAD WITHOUT CONTRAST TECHNIQUE: Contiguous axial images were obtained from the base of the skull through the vertex without intravenous contrast. COMPARISON:  July 27, 2020 FINDINGS: Brain: No evidence of acute infarction, hemorrhage, hydrocephalus, extra-axial collection or mass lesion/mass effect. Vascular: No hyperdense vessel or unexpected calcification. Skull: Normal. Negative for fracture or focal lesion. Sinuses/Orbits: No acute finding.  Other: None. IMPRESSION: No acute intracranial abnormalities. Electronically Signed   By: Dorise Bullion III M.D   On: 05/04/2021 19:44   MR MRV HEAD W WO CONTRAST  Result Date: 05/05/2021 CLINICAL DATA:  Initial evaluation for acute migraine headache. EXAM: MR VENOGRAM HEAD WITHOUT AND WITH CONTRAST TECHNIQUE: Angiographic images of the intracranial venous structures were acquired using MRV technique without and with intravenous contrast. CONTRAST:  20mL GADAVIST GADOBUTROL 1 MMOL/ML IV SOLN COMPARISON:  No pertinent prior exam. FINDINGS: Normal flow related signal and enhancement seen throughout the superior sagittal sinus to the torcula. Transverse and sigmoid sinuses appear patent as do the visualized proximal internal jugular veins. Right transverse sinus dominant, with a diffusely hypoplastic left transverse sinus. Straight sinus, vein of Galen, internal cerebral veins, and basal veins of Rosenthal  appear patent. No abnormality seen about the cavernous sinus. Superior orbital veins symmetric. No appreciable cortical vein thrombosis. No findings to suggest dural sinus thrombosis. IMPRESSION: Normal intracranial MRV. No evidence for dural sinus thrombosis. Electronically Signed   By: Jeannine Boga M.D.   On: 05/05/2021 02:19         Amaryllis Dyke, PA-C 05/05/21 0234    Orpah Greek, MD 05/05/21 [phone]

## 2021-05-04 NOTE — ED Notes (Signed)
Visual acuity screening Right eye 20/32 Left eye 20/50

## 2021-05-05 ENCOUNTER — Emergency Department (HOSPITAL_COMMUNITY): Payer: Medicaid Other

## 2021-05-05 DIAGNOSIS — G43909 Migraine, unspecified, not intractable, without status migrainosus: Secondary | ICD-10-CM | POA: Diagnosis not present

## 2021-05-05 IMAGING — MR MR MRV HEAD WO/W CM
7 of 9 series · 34 of 48 positions shown · IV contrast (Gadavist)
Comparison: No pertinent prior exam.

CLINICAL DATA: Initial evaluation for acute migraine headache.

EXAM:
MR VENOGRAM HEAD WITHOUT AND WITH CONTRAST
TECHNIQUE: Angiographic images of the intracranial venous structures were
acquired using MRV technique without and with intravenous contrast.
CONTRAST:  10mL GADAVIST GADOBUTROL 1 MMOL/ML IV SOLN

[Series 5: tof_fl2d_paracor · coronal · 2.0mm · 0.98mm/px · 4 of 137 slices shown]
[im 1/137]
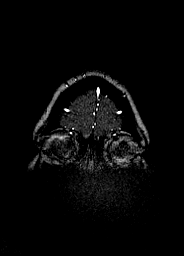
[im 46/137]
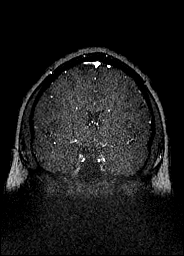
[im 91/137]
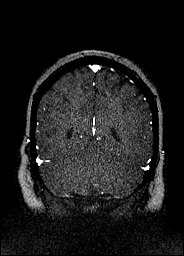
[im 137/137]
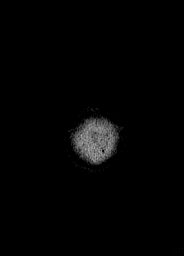

[Series 13: venous inhance coronal · coronal · portal-venous · 0.9mm · 0.57mm/px · 7 of 192 slices shown (1 of 2)]
[im 1/192]
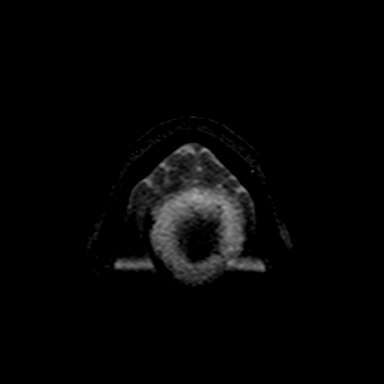
[im 32/192]
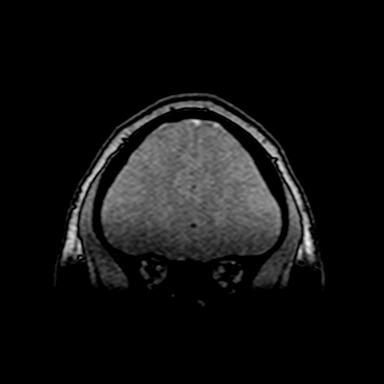
[im 64/192]
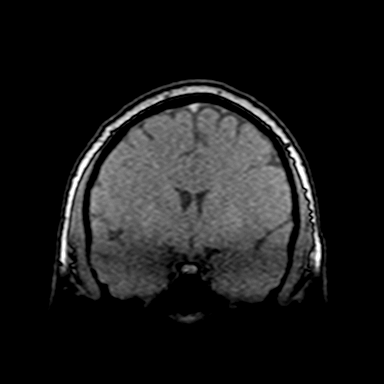
[im 96/192]
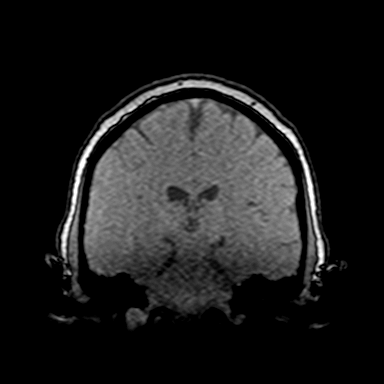
[im 128/192]
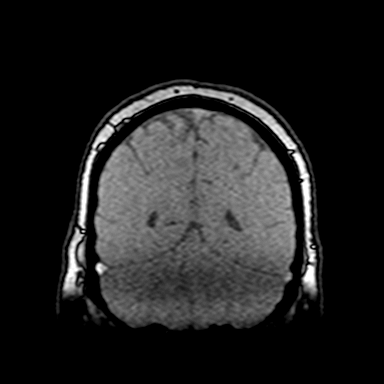
[im 160/192]
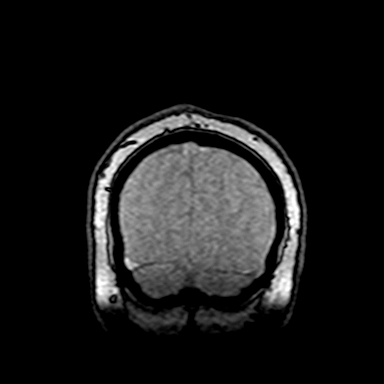
[im 192/192]
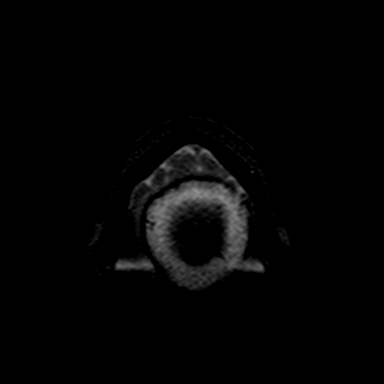

[Series 14: venous inhance coronal_msum · coronal · portal-venous · 0.9mm · 0.57mm/px · 7 of 192 slices shown]
[im 1/192]
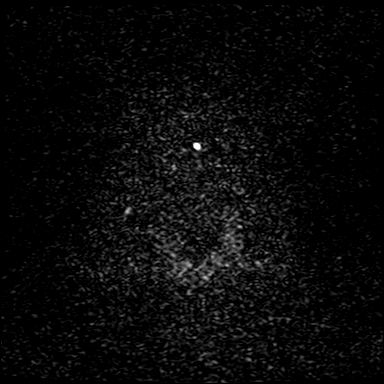
[im 32/192]
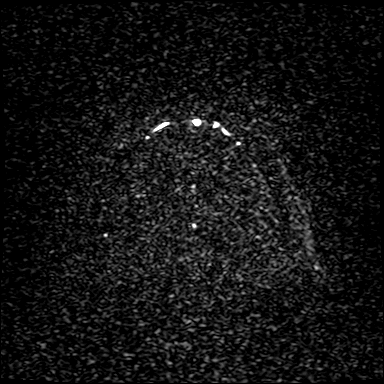
[im 64/192]
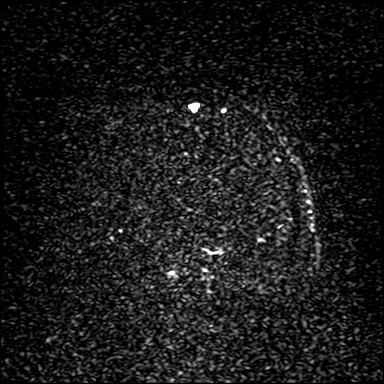
[im 96/192]
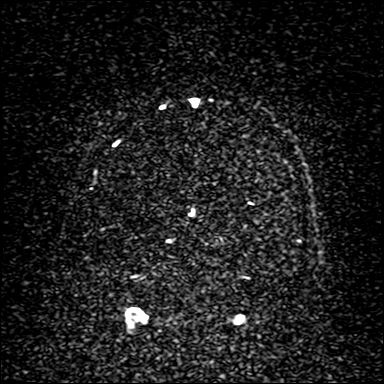
[im 128/192]
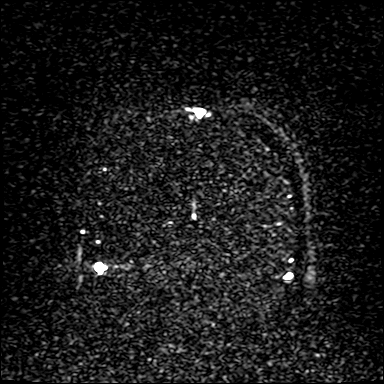
[im 160/192]
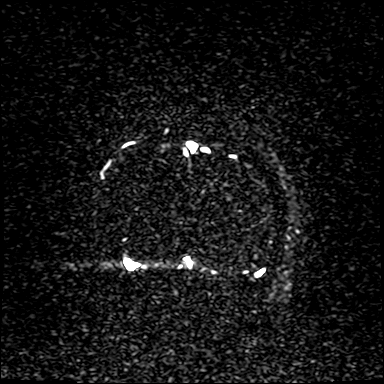
[im 192/192]
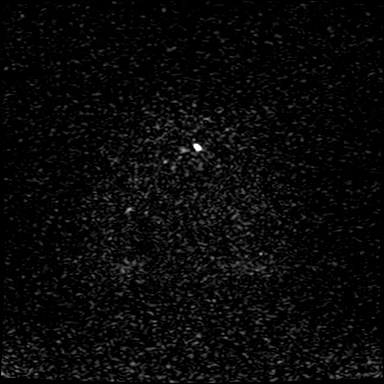

[Series 17: t1_fl3d_sag_p2_iso · sagittal · 1.0mm · 0.98mm/px · 7 of 192 slices shown]
[im 1/192]
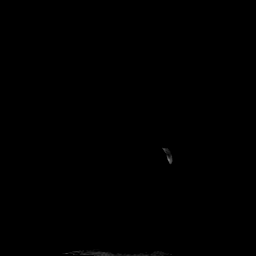
[im 32/192]
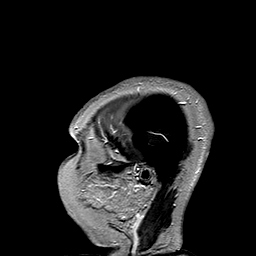
[im 64/192]
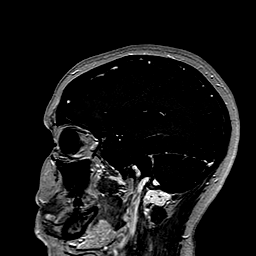
[im 96/192]
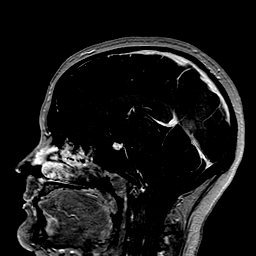
[im 128/192]
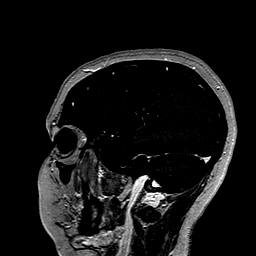
[im 160/192]
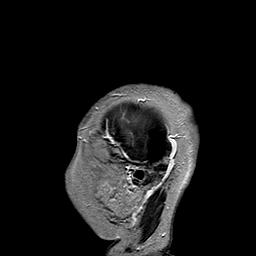
[im 192/192]
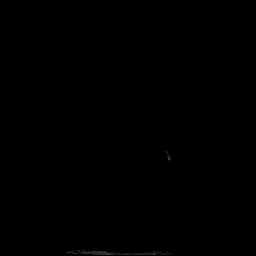

[Series 18: t1_fl3d_sag_p2_iso_mpr_ axial · axial · 2.0mm · 0.45mm/px · z∈[-212,+16]mm · 4 of 120 slices shown]
[im 1/120]
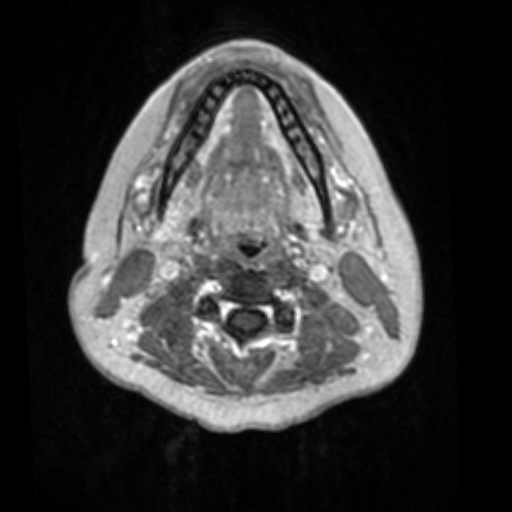
[im 40/120]
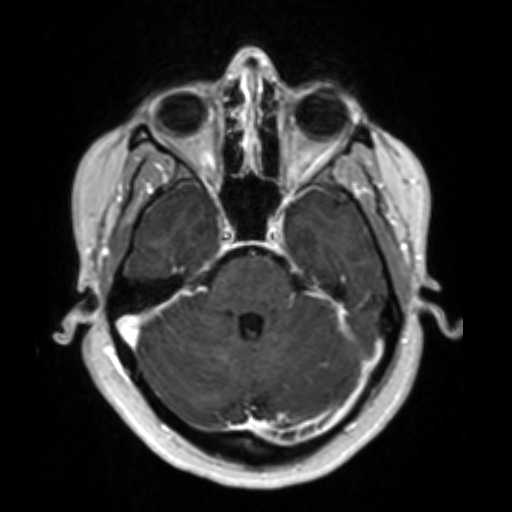
[im 80/120]
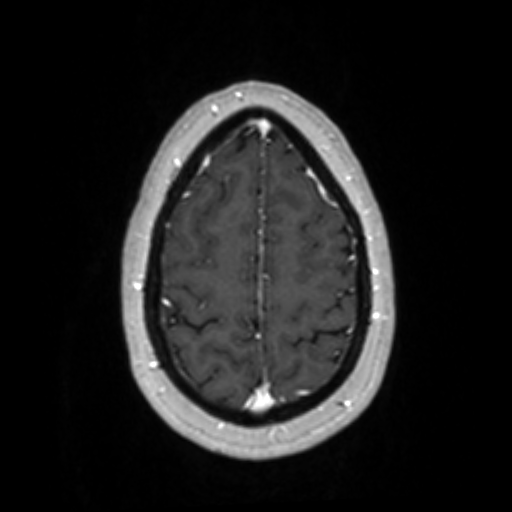
[im 120/120]
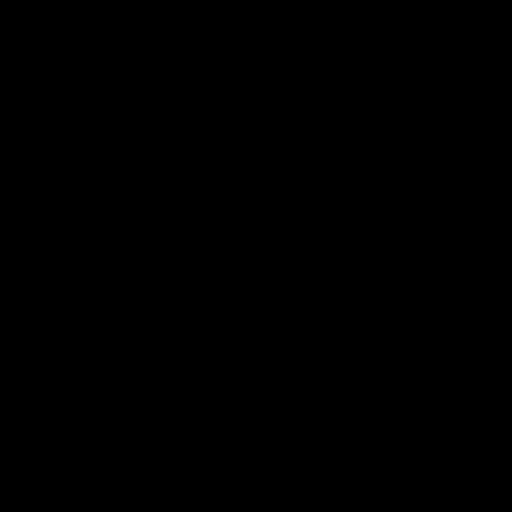

[Series 19: t1_fl3d_sag_p2_iso_mpr_coronal · coronal · 2.0mm · 0.45mm/px · 4 of 120 slices shown]
[im 1/120]
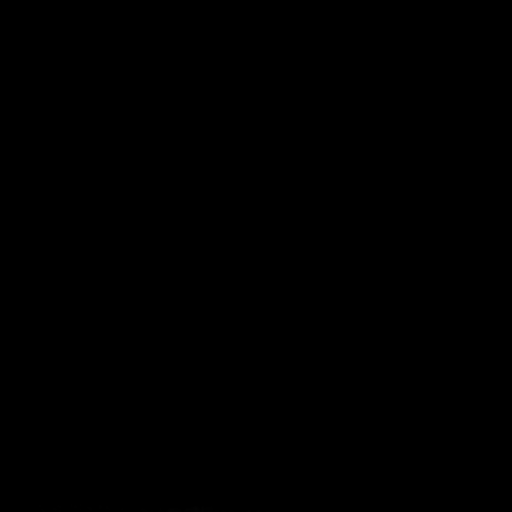
[im 40/120]
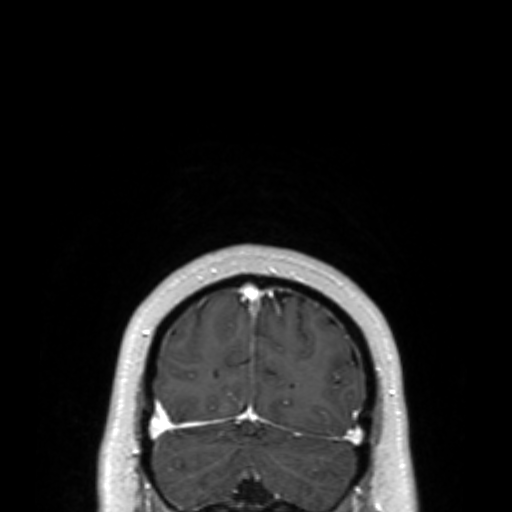
[im 80/120]
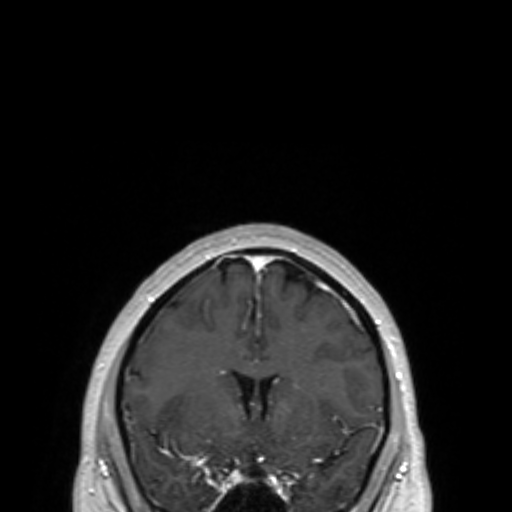
[im 120/120]
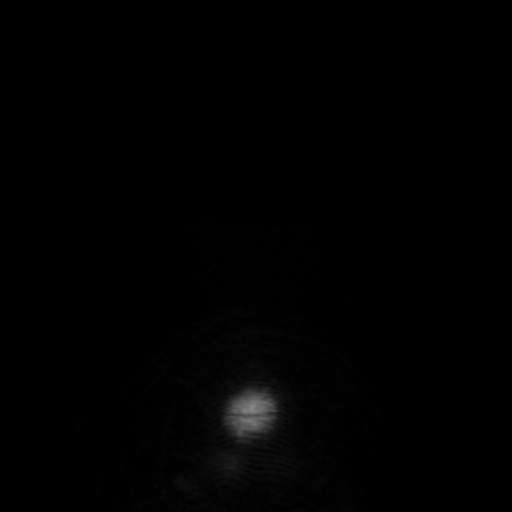

[Series 20: venous inhance coronal · coronal · portal-venous · 0.9mm · 0.57mm/px · 1 of 192 slices shown (2 of 2)]
[im 1/192]
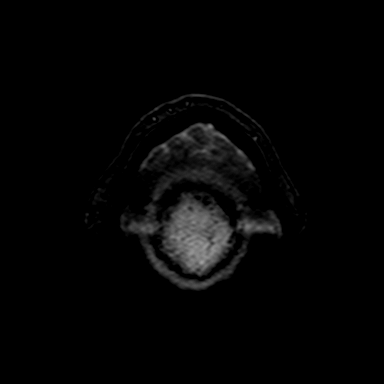

[34 of 48 positions shown; findings below may reference images not displayed]

FINDINGS: Normal flow related signal and enhancement seen throughout the
superior sagittal sinus to the torcula. Transverse and sigmoid
sinuses appear patent as do the visualized proximal internal jugular
veins. Right transverse sinus dominant, with a diffusely hypoplastic
left transverse sinus. Straight sinus, vein of LEES, internal
cerebral veins, and basal veins of LEES appear patent. No
abnormality seen about the cavernous sinus. Superior orbital veins
symmetric. No appreciable cortical vein thrombosis. No findings to
suggest dural sinus thrombosis.
IMPRESSION: Normal intracranial MRV. No evidence for dural sinus thrombosis.

## 2021-05-05 MED ORDER — GADOBUTROL 1 MMOL/ML IV SOLN
10.0000 mL | Freq: Once | INTRAVENOUS | Status: AC | PRN
  Administered 2021-05-05: 10 mL via INTRAVENOUS

## 2021-05-05 NOTE — Discharge Instructions (Signed)
You were seen in the ER today for a migraine with vision abnormalities.   Your MRI did not show any abnormalities.   Please follow up with your neurologist, call the office this morning to make them aware of your visit to the ER and to schedule close follow up.   Return to the ER for new or worsening symptoms including but not limited to new or worsening pain, new or worsening vision changes, loss of vision, vision problems on one side, fever, neck stiffness, numbness, weakness, trouble walking, or any other concerns.

## 2021-05-05 NOTE — ED Notes (Signed)
Patient transported to MRI 

## 2021-05-06 ENCOUNTER — Telehealth: Payer: Self-pay

## 2021-05-06 ENCOUNTER — Telehealth: Payer: Self-pay | Admitting: Neurology

## 2021-05-06 NOTE — Telephone Encounter (Signed)
Transition Care Management Follow-up Telephone Call Date of discharge and from where: 05/05/2021 from [facility] How have you been since you were released from the hospital? Pt stated that she is feeling better.  Any questions or concerns? No  Items Reviewed: Did the pt receive and understand the discharge instructions provided? Yes  Medications obtained and verified? Yes  Other? No  Any new allergies since your discharge? No  Dietary orders reviewed? No Do you have support at home? Yes   Functional Questionnaire: (I = Independent and D = Dependent) ADLs: I  Bathing/Dressing- I  Meal Prep- I  Eating- I  Maintaining continence- I  Transferring/Ambulation- I  Managing Meds- I   Follow up appointments reviewed:  PCP Hospital f/u appt confirmed? No   Specialist Hospital f/u appt confirmed? Yes   Are transportation arrangements needed? No  If their condition worsens, is the pt aware to call PCP or go to the Emergency Dept.? Yes Was the patient provided with contact information for the PCP's office or ED? Yes Was to pt encouraged to call back with questions or concerns? Yes

## 2021-05-06 NOTE — Telephone Encounter (Signed)
Patient called in, requesting a sooner apt than 07/24/21. She was in hospital due to extreme migraine, and it affected her vision. She has shadowing and it has impaired her vision.  Would like a call back [phone]

## 2021-05-06 NOTE — Telephone Encounter (Signed)
Patient has been added to the wait list as a high priority for headaches.   2. Patient wants an update on her Nurtec prior authorization.

## 2021-05-07 NOTE — Telephone Encounter (Signed)
Advised pt per our records pt was approved to get Nurtec.  Re did PA waiting on response.
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
## 2021-05-13 ENCOUNTER — Encounter: Payer: Self-pay | Admitting: Neurology

## 2021-05-13 ENCOUNTER — Other Ambulatory Visit: Payer: Self-pay

## 2021-05-13 ENCOUNTER — Ambulatory Visit: Payer: Medicaid Other | Admitting: Neurology

## 2021-05-13 VITALS — BP 140/95 | HR 95 | Ht 67.0 in | Wt 230.6 lb

## 2021-05-13 DIAGNOSIS — G43509 Persistent migraine aura without cerebral infarction, not intractable, without status migrainosus: Secondary | ICD-10-CM

## 2021-05-13 NOTE — Patient Instructions (Addendum)
Will check MRI of brain with and without contrast  When you get a migraine, take Nurtec (maximum 1 tablet 24 hours).  If effective, contact me for prescription Stop sumatriptan Get Mirena removed Follow up 4 months.

## 2021-05-19 ENCOUNTER — Other Ambulatory Visit: Payer: Self-pay | Admitting: Neurology

## 2021-05-19 MED ORDER — NORTRIPTYLINE HCL 10 MG PO CAPS: 10.0000 mg | ORAL_CAPSULE | Freq: Every day | ORAL | 1 refills | Status: DC

## 2021-05-19 MED ORDER — NURTEC 75 MG PO TBDP: 75.0000 mg | ORAL_TABLET | Freq: Every day | ORAL | 5 refills | Status: DC | PRN

## 2021-06-02 DIAGNOSIS — Z01419 Encounter for gynecological examination (general) (routine) without abnormal findings: Secondary | ICD-10-CM | POA: Diagnosis not present

## 2021-06-09 ENCOUNTER — Other Ambulatory Visit: Payer: Self-pay

## 2021-06-09 ENCOUNTER — Ambulatory Visit
Admission: RE | Admit: 2021-06-09 | Discharge: 2021-06-09 | Disposition: A | Discharge status: Home / Self Care | Payer: Medicaid Other | Source: Ambulatory Visit | Attending: Neurology | Admitting: Neurology

## 2021-06-09 DIAGNOSIS — G43509 Persistent migraine aura without cerebral infarction, not intractable, without status migrainosus: Secondary | ICD-10-CM

## 2021-06-09 DIAGNOSIS — G930 Cerebral cysts: Secondary | ICD-10-CM | POA: Diagnosis not present

## 2021-06-09 DIAGNOSIS — J341 Cyst and mucocele of nose and nasal sinus: Secondary | ICD-10-CM | POA: Diagnosis not present

## 2021-06-09 DIAGNOSIS — G43109 Migraine with aura, not intractable, without status migrainosus: Secondary | ICD-10-CM | POA: Diagnosis not present

## 2021-06-09 IMAGING — MR MR HEAD WO/W CM
13 series · 48 of 48 positions shown · IV contrast (multihance)
Comparison: MRV head [DATE]. Prior head CT examinations
[DATE] and earlier.

CLINICAL DATA: Migraine with persistent visual RAWAND. Headache,
chronic, no new features; migraine with persistent visual RAWAND.
Additional history provided by scanning technologist: Patient
reports migraines and vision changes, "areas of black spots in
vision."

EXAM:
MRI HEAD WITHOUT AND WITH CONTRAST
TECHNIQUE: Multiplanar, multiecho pulse sequences of the brain and surrounding
structures were obtained without and with intravenous contrast.
CONTRAST:  20mL MULTIHANCE GADOBENATE DIMEGLUMINE 529 MG/ML IV SOLN

[Series 5: T1 · sagittal · 4.0mm · 0.75mm/px · 1 of 26 slices shown (1 of 3)]
[im 1/26]
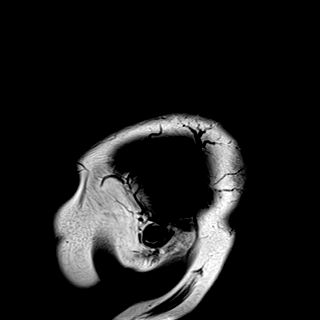

[Series 6: DWI · axial · 3.0mm · 0.94mm/px · z∈[-61,+93]mm · 8 of 176 slices shown (1 of 3)]
[im 1/176]
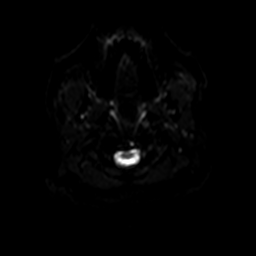
[im 26/176]
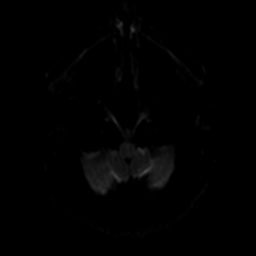
[im 51/176]
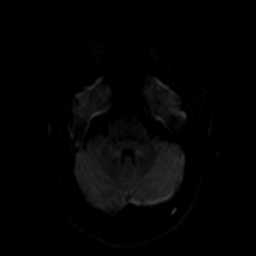
[im 76/176]
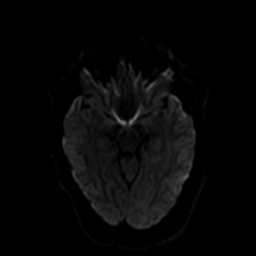
[im 101/176]
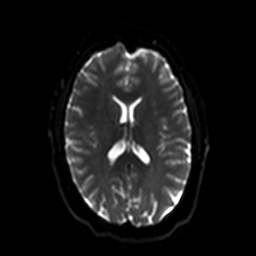
[im 126/176]
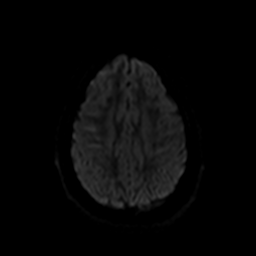
[im 151/176]
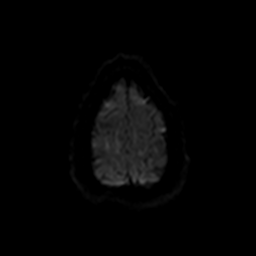
[im 176/176]
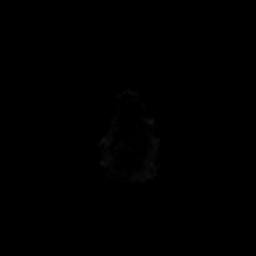

[Series 7: ax dwi_tracew · axial · 3.0mm · 0.94mm/px · z∈[-61,+93]mm · 4 of 88 slices shown]
[im 1/88]
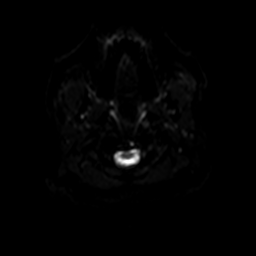
[im 30/88]
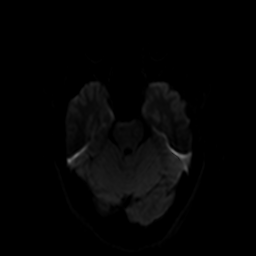
[im 59/88]
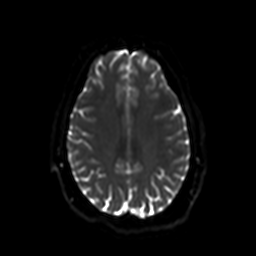
[im 88/88]
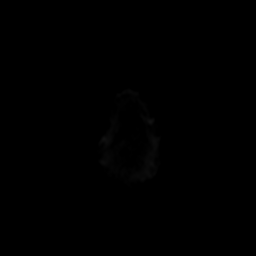

[Series 8: ax dwi_adc · axial · 3.0mm · 0.94mm/px · z∈[-61,+93]mm · 2 of 44 slices shown]
[im 1/44]
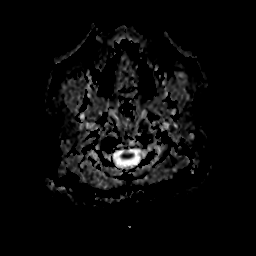
[im 44/44]
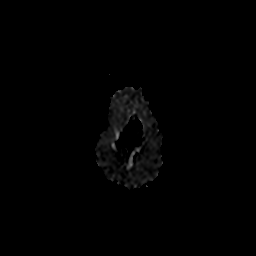

[Series 9: DWI · coronal · 5.0mm · 1.44mm/px · 3 of 68 slices shown (2 of 3)]
[im 1/68]
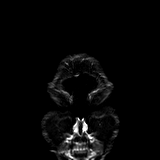
[im 34/68]
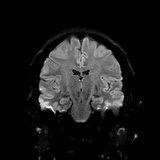
[im 68/68]
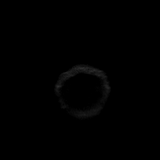

[Series 10: DWI · coronal · 5.0mm · 1.44mm/px · 2 of 34 slices shown (3 of 3)]
[im 1/34]
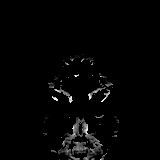
[im 34/34]
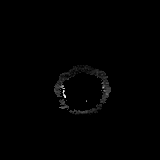

[Series 11: T2 · axial · 4.0mm · 0.36mm/px · z∈[-63,+92]mm · 2 of 31 slices shown]
[im 1/31]
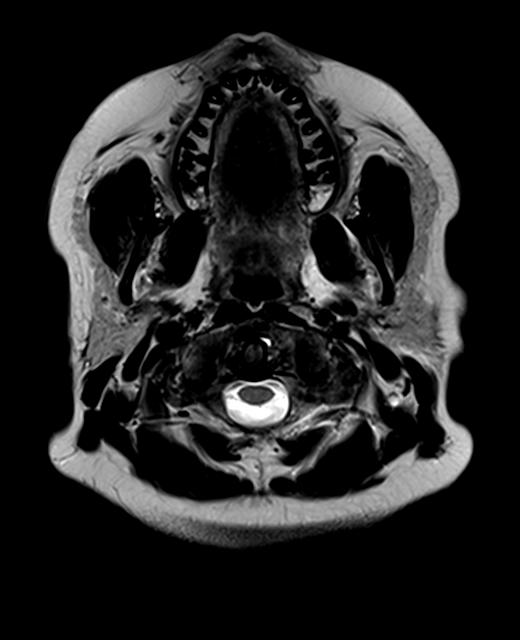
[im 31/31]
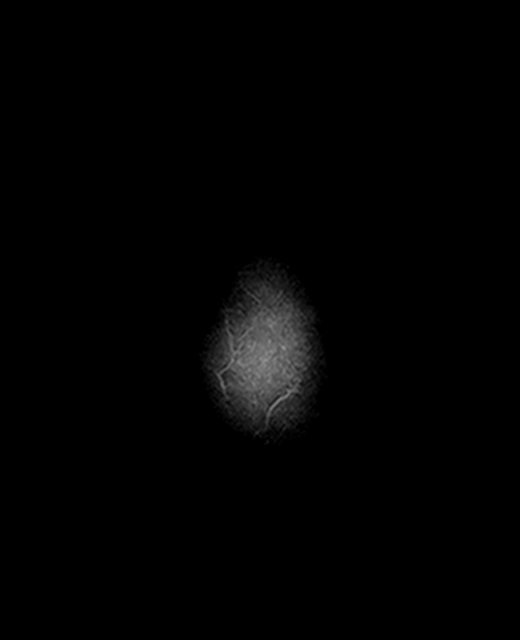

[Series 12: FLAIR · axial · 3.0mm · 0.72mm/px · 1 of 26 slices shown]
[im 1/26]
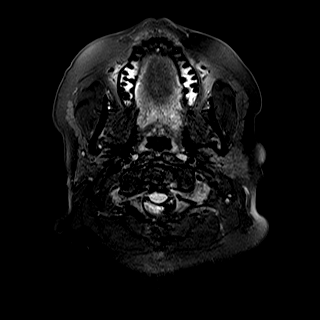

[Series 13: swi_images · axial · 1.5mm · 0.90mm/px · z∈[-57,+85]mm · 5 of 96 slices shown]
[im 1/96]
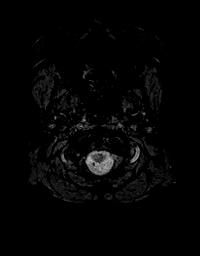
[im 24/96]
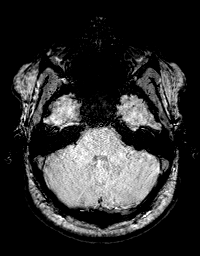
[im 48/96]
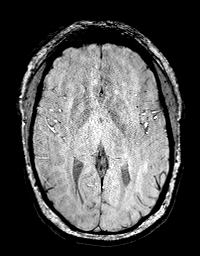
[im 72/96]
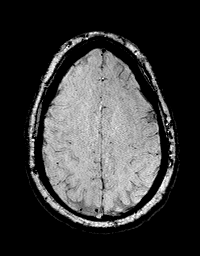
[im 96/96]
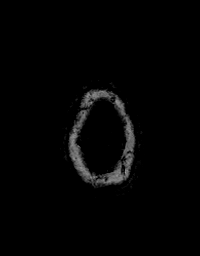

[Series 15: T1 · axial · 1.0mm · 0.94mm/px · z∈[-65,+94]mm · 8 of 160 slices shown (2 of 3)]
[im 1/160]
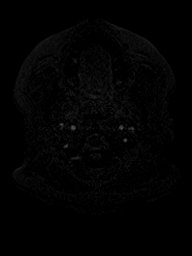
[im 23/160]
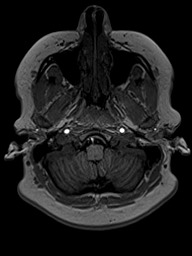
[im 46/160]
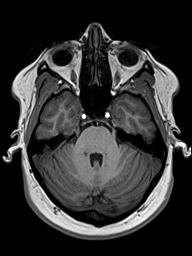
[im 69/160]
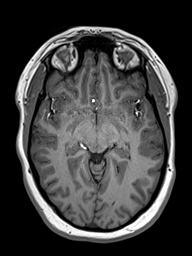
[im 91/160]
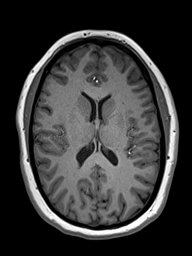
[im 114/160]
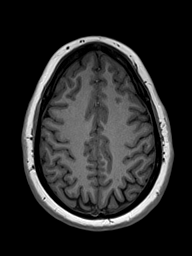
[im 137/160]
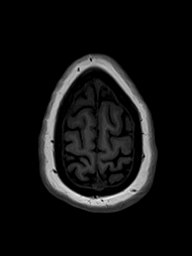
[im 160/160]
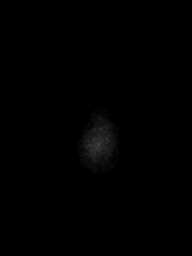

[Series 16: T2 post-contrast · coronal · 4.0mm · 0.36mm/px · 2 of 36 slices shown]
[im 1/36]
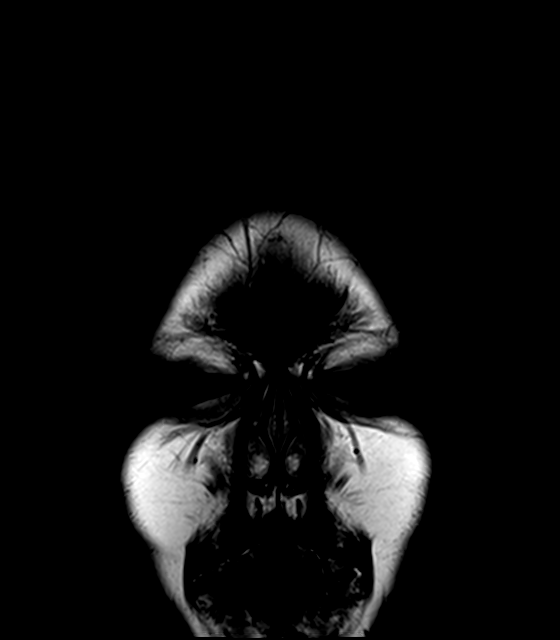
[im 36/36]
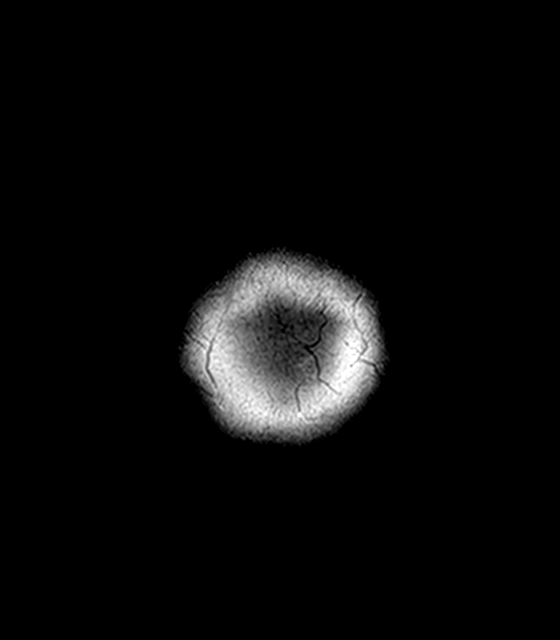

[Series 17: T1 · axial · 1.0mm · 0.94mm/px · z∈[-65,+94]mm · 8 of 160 slices shown (3 of 3)]
[im 1/160]
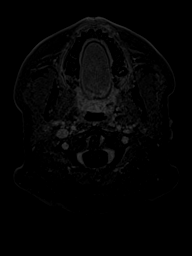
[im 23/160]
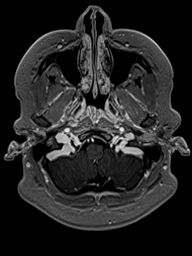
[im 46/160]
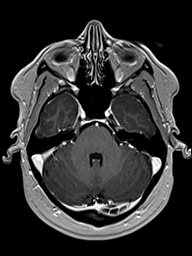
[im 69/160]
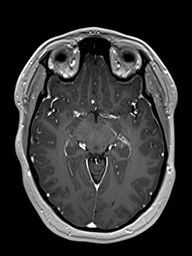
[im 91/160]
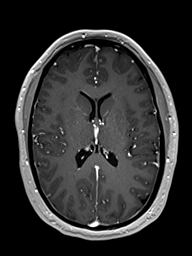
[im 114/160]
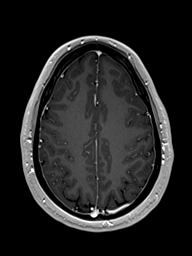
[im 137/160]
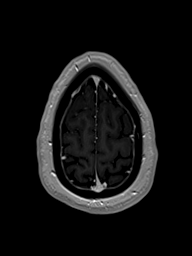
[im 160/160]
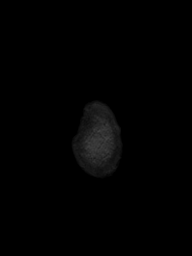

[Series 18: T1 post-contrast · coronal · 4.0mm · 0.72mm/px · 2 of 36 slices shown]
[im 1/36]
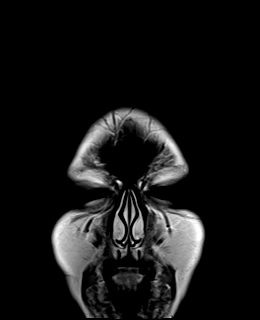
[im 36/36]
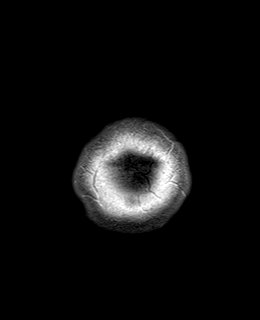

[48 of 48 positions shown; findings below may reference images not displayed]

FINDINGS: Brain:

Cerebral volume is normal.

No cortical encephalomalacia is identified. No significant white
matter disease.

Incidentally noted 6 mm pineal cyst without suspicious mass-like or
nodular enhancement.

There is no acute infarct.

No evidence of an intracranial mass.

No chronic intracranial blood products.

No extra-axial fluid collection.

No midline shift.

No pathologic intracranial enhancement.

Vascular: Expected proximal arterial flow voids.

Skull and upper cervical spine: No focal marrow lesion.

Sinuses/Orbits: Visualized orbits show no acute finding. Tiny right
maxillary sinus mucous retention cyst.

Other: Nonspecific prominence of the adenoid/nasopharyngeal soft
tissues.
IMPRESSION: No evidence of acute intracranial abnormality.

Incidentally noted 6 mm pineal cyst.

Otherwise unremarkable MRI appearance of the brain.

Nonspecific prominence of the adenoid/nasopharyngeal soft tissues.

## 2021-06-09 MED ORDER — GADOBENATE DIMEGLUMINE 529 MG/ML IV SOLN
20.0000 mL | Freq: Once | INTRAVENOUS | Status: AC | PRN
  Administered 2021-06-09: 20 mL via INTRAVENOUS

## 2021-06-16 ENCOUNTER — Ambulatory Visit: Payer: Medicaid Other | Admitting: Physician Assistant

## 2021-07-09 ENCOUNTER — Other Ambulatory Visit: Payer: Self-pay

## 2021-07-09 ENCOUNTER — Ambulatory Visit
Admission: RE | Admit: 2021-07-09 | Discharge: 2021-07-09 | Disposition: A | Discharge status: Home / Self Care | Payer: Medicaid Other | Source: Ambulatory Visit | Attending: Internal Medicine | Admitting: Internal Medicine

## 2021-07-09 VITALS — BP 124/73 | HR 97 | Temp 98.4°F | Resp 18

## 2021-07-09 DIAGNOSIS — J029 Acute pharyngitis, unspecified: Secondary | ICD-10-CM | POA: Diagnosis not present

## 2021-07-09 DIAGNOSIS — Z20822 Contact with and (suspected) exposure to covid-19: Secondary | ICD-10-CM | POA: Diagnosis not present

## 2021-07-09 LAB — POCT RAPID STREP A (OFFICE): Rapid Strep A Screen: NEGATIVE

## 2021-07-09 MED ORDER — AMOXICILLIN 875 MG PO TABS: 875.0000 mg | ORAL_TABLET | Freq: Two times a day (BID) | ORAL | 0 refills | Status: AC

## 2021-07-09 NOTE — ED Triage Notes (Signed)
Pt sts sore throat x 3 days

## 2021-07-09 NOTE — ED Provider Notes (Signed)
[facility]    CSN: [id number] Arrival date & time: 07/09/21  1550      History   Chief Complaint Chief Complaint  Patient presents with   Sore Throat    HPI Michele Avila is a 30 y.o. female.   Patient presents with 3-day history of sore throat and some mild nasal congestion.  Denies any known fevers.  Significant other had similar symptoms as well.  Denies any chest pain or shortness of breath.    Sore Throat   Past Medical History:  Diagnosis Date   Anemia    Asthma    hx chronic bronchitis/exercise induced asthma as pre teen only   Environmental allergies    Infection    UTI   Migraine    Seasonal allergies    Wrist fracture, right     Patient Active Problem List   Diagnosis Date Noted   Acute pain of left knee 05/24/2017   Pain in right wrist 05/24/2017   Mirena intrauterine device in place 03/02/2016    Past Surgical History:  Procedure Laterality Date   CESAREAN SECTION  2008   CESAREAN SECTION N/A 06/15/2013   Procedure: CESAREAN SECTION;  Surgeon: Allyn Kenner, DO;  Location: [facility];  Service: Obstetrics;  Laterality: N/A;   DILATION AND CURETTAGE OF UTERUS  08/2011   FOOT SURGERY  2017   INDUCED ABORTION      OB History     Gravida  5   Para  2   Term  2   Preterm      AB  3   Living  2      SAB      IAB  2   Ectopic      Multiple      Live Births  1            Home Medications    Prior to Admission medications   Medication Sig Start Date End Date Taking? Authorizing Provider  amoxicillin (AMOXIL) 875 MG tablet Take 1 tablet (875 mg total) by mouth 2 (two) times daily for 10 days. 07/09/21 07/19/21 Yes Odis Luster, FNP  albuterol (VENTOLIN HFA) 108 (90 Base) MCG/ACT inhaler Inhale 1-2 puffs into the lungs every 6 (six) hours as needed for wheezing or shortness of breath. 03/27/21   Vanessa Kick, MD  diphenhydrAMINE (SOMINEX) 25 MG tablet Take 25-50 mg by mouth daily as needed (headache).     [provider]  hydrocortisone (ANUSOL-HC) 2.5 % rectal cream Place 1 application rectally 2 (two) times daily. Patient taking differently: Place 1 application rectally 2 (two) times daily as needed for hemorrhoids. 12/18/20   Hughie Closs, PA-C  ibuprofen (ADVIL) 800 MG tablet Take 1 tablet (800 mg total) by mouth 3 (three) times daily. Patient taking differently: Take 800 mg by mouth every 8 (eight) hours as needed for headache or mild pain. 02/23/21   Hughie Closs, PA-C  Levonorgestrel (MIRENA, 52 MG, IU) 1 each by Intrauterine route once.     [provider]  nortriptyline (PAMELOR) 10 MG capsule Take 1 capsule (10 mg total) by mouth at bedtime. 05/19/21   Tomi Likens, Adam R, DO  ondansetron (ZOFRAN-ODT) 4 MG disintegrating tablet Take 1 tablet (4 mg total) by mouth every 8 (eight) hours as needed for nausea or vomiting. 01/20/21   Tomi Likens, Adam R, DO  Rimegepant Sulfate (NURTEC) 75 MG TBDP Take 75 mg by mouth daily as needed (Maximum 1 tablet in 24 hours).  05/19/21   Pieter Partridge, DO  Spacer/Aero-Holding Chambers DEVI Use spacer as directed with albuterol metered-dose inhaler 06/08/20   Jacqulyn Cane, MD  tiZANidine (ZANAFLEX) 2 MG tablet Take 2 mg by mouth every 6 (six) hours as needed for muscle spasms.    [provider]  cetirizine (ZYRTEC) 10 MG tablet Take 1 tablet (10 mg total) by mouth daily. 06/06/20 12/18/20  Loura Halt A, NP  esomeprazole (NEXIUM) 40 MG capsule Take 1 capsule (40 mg total) by mouth daily. Patient not taking: Reported on 09/16/2019 07/12/19 10/12/19  Vanessa Kick, MD  fluticasone [facility]) 50 MCG/ACT nasal spray Place 1-2 sprays into both nostrils daily for 7 days. 06/06/20 12/18/20  Loura Halt A, NP  loratadine (CLARITIN) 10 MG tablet Take 1 tablet (10 mg total) by mouth daily. Patient not taking: Reported on 12/03/2019 10/12/19 01/16/20  Wieters, Elesa Hacker, PA-C  promethazine (PHENERGAN) 25 MG suppository Place 1 suppository (25 mg total)  rectally every 6 (six) hours as needed for nausea or vomiting. Patient not taking: Reported on 04/19/2020 12/17/19 06/06/20  Tacy Learn, PA-C    Family History Family History  Problem Relation Age of Onset   Cancer Father        liver   Diabetes Father    Hypertension Father    Hypertension Mother    Hypertension Maternal Grandmother    Kidney disease Maternal Grandmother    Diabetes Paternal Grandmother    Hypertension Paternal Grandmother    Ulcers Brother    Crohn's disease Brother     Social History Social History   Tobacco Use   Smoking status: Every Day    Packs/day: 0.50    Types: Cigarettes   Smokeless tobacco: Never   Tobacco comments:    down to 5 cigarrettes/day  Vaping Use   Vaping Use: Never used  Substance Use Topics   Alcohol use: Yes    Comment: occassionally    Drug use: Yes    Types: Marijuana     Allergies   [name] [camphor-menthol], Yellow dyes (non-tartrazine), and Latex   Review of Systems Review of Systems Per HPI  Physical Exam Triage Vital Signs ED Triage Vitals  Enc Vitals Group     BP 07/09/21 1602 124/73     Pulse Rate 07/09/21 1602 97     Resp 07/09/21 1602 18     Temp 07/09/21 1602 98.4 F (36.9 C)     Temp Source 07/09/21 1602 Oral     SpO2 07/09/21 1602 98 %     Weight --      Height --      Head Circumference --      Peak Flow --      Pain Score 07/09/21 1603 5     Pain Loc --      Pain Edu? --      Excl. in [place]? --    No data found.  Updated Vital Signs BP 124/73 (BP Location: Left Arm)   Pulse 97   Temp 98.4 F (36.9 C) (Oral)   Resp 18   SpO2 98%   Visual Acuity Right Eye Distance:   Left Eye Distance:   Bilateral Distance:    Right Eye Near:   Left Eye Near:    Bilateral Near:     Physical Exam Constitutional:      General: She is not in acute distress.    Appearance: Normal appearance.  HENT:     Head: Normocephalic and atraumatic.  Right Ear: Tympanic membrane and ear canal  normal.     Left Ear: Tympanic membrane and ear canal normal.     Nose: Congestion present.     Mouth/Throat:     Mouth: Mucous membranes are moist.     Pharynx: Oropharyngeal exudate and posterior oropharyngeal erythema present.  Eyes:     Extraocular Movements: Extraocular movements intact.     Conjunctiva/sclera: Conjunctivae normal.     Pupils: Pupils are equal, round, and reactive to light.  Cardiovascular:     Rate and Rhythm: Normal rate and regular rhythm.     Pulses: Normal pulses.     Heart sounds: Normal heart sounds.  Pulmonary:     Effort: Pulmonary effort is normal. No respiratory distress.     Breath sounds: Normal breath sounds. No wheezing.  Abdominal:     General: Abdomen is flat. Bowel sounds are normal.     Palpations: Abdomen is soft.  Musculoskeletal:        General: Normal range of motion.     Cervical back: Normal range of motion.  Skin:    General: Skin is warm and dry.  Neurological:     General: No focal deficit present.     Mental Status: She is alert and oriented to person, place, and time. Mental status is at baseline.  Psychiatric:        Mood and Affect: Mood normal.        Behavior: Behavior normal.     UC Treatments / Results  Labs (all labs ordered are listed, but only abnormal results are displayed) Labs Reviewed  CULTURE, GROUP A STREP ([place])  NOVEL CORONAVIRUS, NAA  POCT RAPID STREP A (OFFICE)    EKG   Radiology No results found.  Procedures Procedures (including critical care time)  Medications Ordered in UC Medications - No data to display  Initial Impression / Assessment and Plan / UC Course  I have reviewed the triage vital signs and the nursing notes.  Pertinent labs & imaging results that were available during my care of the patient were reviewed by me and considered in my medical decision making (see chart for details).     Rapid strep test was negative in urgent care today.  Although, suspect possible other  variation of strep throat due to appearance of posterior pharynx on physical exam.  Will treat with amoxicillin x10 days.  Throat culture and COVID-19 viral swab are pending.  Discussed over-the-counter medications to alleviate symptoms.  Discussed strict return precautions. Patient verbalized understanding and is agreeable with plan.  Final Clinical Impressions(s) / UC Diagnoses   Final diagnoses:  Sore throat  Encounter for laboratory testing for COVID-19 virus     Discharge Instructions      You have been prescribed Amoxicilin antibiotic to treat sore throat.  Throat culture and COVID-19 viral swab are pending.  We will call if these are positive.     ED Prescriptions     Medication Sig Dispense Auth. Provider   amoxicillin (AMOXIL) 875 MG tablet Take 1 tablet (875 mg total) by mouth 2 (two) times daily for 10 days. 20 tablet Odis Luster, FNP      PDMP not reviewed this encounter.   Odis Luster, [place] 07/09/21 1624

## 2021-07-09 NOTE — Discharge Instructions (Addendum)
You have been prescribed Amoxicilin antibiotic to treat sore throat.  Throat culture and COVID-19 viral swab are pending.  We will call if these are positive.

## 2021-07-10 LAB — SARS-COV-2, NAA 2 DAY TAT

## 2021-07-10 LAB — NOVEL CORONAVIRUS, NAA: SARS-CoV-2, NAA: NOT DETECTED

## 2021-07-12 LAB — CULTURE, GROUP A STREP (THRC)

## 2021-07-13 ENCOUNTER — Encounter (HOSPITAL_COMMUNITY): Payer: Self-pay | Admitting: *Deleted

## 2021-07-13 ENCOUNTER — Other Ambulatory Visit: Payer: Self-pay

## 2021-07-13 ENCOUNTER — Ambulatory Visit (HOSPITAL_COMMUNITY)
Admission: EM | Admit: 2021-07-13 | Discharge: 2021-07-13 | Disposition: A | Discharge status: Home / Self Care | Payer: Medicaid Other | Attending: Emergency Medicine | Admitting: Emergency Medicine

## 2021-07-13 DIAGNOSIS — J069 Acute upper respiratory infection, unspecified: Secondary | ICD-10-CM

## 2021-07-13 MED ORDER — KETOROLAC TROMETHAMINE 30 MG/ML IJ SOLN
30.0000 mg | Freq: Once | INTRAMUSCULAR | Status: AC
  Administered 2021-07-13: 30 mg via INTRAMUSCULAR

## 2021-07-13 MED ORDER — SUMATRIPTAN SUCCINATE 6 MG/0.5ML ~~LOC~~ SOLN
6.0000 mg | Freq: Once | SUBCUTANEOUS | Status: AC
  Administered 2021-07-13: 6 mg via SUBCUTANEOUS

## 2021-07-13 MED ORDER — METOCLOPRAMIDE HCL 5 MG/ML IJ SOLN
INTRAMUSCULAR | Status: AC
  Filled 2021-07-13: qty 2

## 2021-07-13 MED ORDER — DEXAMETHASONE SODIUM PHOSPHATE 10 MG/ML IJ SOLN
INTRAMUSCULAR | Status: AC
  Filled 2021-07-13: qty 1

## 2021-07-13 MED ORDER — ONDANSETRON 4 MG PO TBDP: 4.0000 mg | ORAL_TABLET | Freq: Three times a day (TID) | ORAL | 0 refills | Status: DC | PRN

## 2021-07-13 MED ORDER — SUMATRIPTAN SUCCINATE 6 MG/0.5ML ~~LOC~~ SOLN
SUBCUTANEOUS | Status: AC
  Filled 2021-07-13: qty 0.5

## 2021-07-13 MED ORDER — KETOROLAC TROMETHAMINE 30 MG/ML IJ SOLN
INTRAMUSCULAR | Status: AC
  Filled 2021-07-13: qty 1

## 2021-07-13 MED ORDER — ONDANSETRON 4 MG PO TBDP: 4.0000 mg | ORAL_TABLET | Freq: Once | ORAL | Status: DC

## 2021-07-13 MED ORDER — FLUTICASONE PROPIONATE 50 MCG/ACT NA SUSP: 1.0000 | Freq: Every day | NASAL | 2 refills | Status: DC

## 2021-07-13 MED ORDER — GUAIFENESIN-DM 100-10 MG/5ML PO SYRP: 5.0000 mL | ORAL_SOLUTION | ORAL | 0 refills | Status: DC | PRN

## 2021-07-13 MED ORDER — METOCLOPRAMIDE HCL 5 MG/ML IJ SOLN
10.0000 mg | Freq: Once | INTRAMUSCULAR | Status: AC
  Administered 2021-07-13: 10 mg via INTRAMUSCULAR

## 2021-07-13 MED ORDER — DEXAMETHASONE SODIUM PHOSPHATE 10 MG/ML IJ SOLN
10.0000 mg | Freq: Once | INTRAMUSCULAR | Status: AC
  Administered 2021-07-13: 10 mg via INTRAMUSCULAR

## 2021-07-13 MED ORDER — BENZONATATE 100 MG PO CAPS: 100.0000 mg | ORAL_CAPSULE | Freq: Three times a day (TID) | ORAL | 0 refills | Status: DC

## 2021-07-13 NOTE — ED Triage Notes (Signed)
Pt reports previous treatment at [facility]. Pt has finished ant-Bx from previous. Pt now has A migrain along with chest congestion.

## 2021-07-13 NOTE — Discharge Instructions (Addendum)
Continue use of prescribed medications for migraine  You may take Tessalon every 8 hours as needed for cough  You can take Flonase 1 spray in each nare every morning to help with congestion  You may take Robitussin-DM 5 mils every 4 hours as needed for cough and congestion  You may use Zofran underneath the tongue every 8 hours as needed for nausea  You can take the Tessalon perles as needed for cough.  You can also take the Promethazine DM as needed for cough at night.  Promethazine DM can make you sleepy so don't take it prior to driving.   You can take Tylenol and/or Ibuprofen as needed for fever reduction and pain relief.   For cough: honey 1/2 to 1 teaspoon (you can dilute the honey in water or another fluid).  You can use a humidifier for chest congestion and cough.  If you don't have a humidifier, you can sit in the bathroom with the hot shower running.      For sore throat: try warm salt water gargles, cepacol lozenges, throat spray, warm tea or water with lemon/honey, popsicles or ice, or OTC cold relief medicine for throat discomfort.   For congestion: take a daily anti-histamine like Zyrtec, Claritin, and a oral decongestant, such as pseudoephedrine.     It is important to stay hydrated: drink plenty of fluids (water, gatorade/powerade/pedialyte, juices, or teas) to keep your throat moisturized and help further relieve irritation/discomfort.

## 2021-07-14 NOTE — ED Provider Notes (Signed)
[facility]    CSN: [id number] Arrival date & time: 07/13/21  1657      History   Chief Complaint Chief Complaint  Patient presents with   Nasal Congestion   chest congestion   Cough   Migraine    HPI Michele Avila is a 30 y.o. female.   Patient presents with nasal congestion, rhinorrhea, migraine with associated nausea and photophobia, sore throat and productive cough for 8 days.  Poor appetite but tolerating liquids.  Denies fever, chills, body aches, abdominal pain, diarrhea, vomiting, wheezing, shortness of breath, ear pain or fullness.  Seen in urgent care on July 09, 2021, received treatment for strep throat however rapid and culture negative.  Finish antibiotic course.  Daily medication use for migraines has been ineffective.  COVID and flu test on 07/09/2021 negative.  History of asthma.  Past Medical History:  Diagnosis Date   Anemia    Asthma    hx chronic bronchitis/exercise induced asthma as pre teen only   Environmental allergies    Infection    UTI   Migraine    Seasonal allergies    Wrist fracture, right     Patient Active Problem List   Diagnosis Date Noted   Acute pain of left knee 05/24/2017   Pain in right wrist 05/24/2017   Mirena intrauterine device in place 03/02/2016    Past Surgical History:  Procedure Laterality Date   CESAREAN SECTION  2008   CESAREAN SECTION N/A 06/15/2013   Procedure: CESAREAN SECTION;  Surgeon: Allyn Kenner, DO;  Location: [facility];  Service: Obstetrics;  Laterality: N/A;   DILATION AND CURETTAGE OF UTERUS  08/2011   FOOT SURGERY  2017   INDUCED ABORTION      OB History     Gravida  5   Para  2   Term  2   Preterm      AB  3   Living  2      SAB      IAB  2   Ectopic      Multiple      Live Births  1            Home Medications    Prior to Admission medications   Medication Sig Start Date End Date Taking? Authorizing Provider  benzonatate (TESSALON) 100 MG capsule  Take 1 capsule (100 mg total) by mouth every 8 (eight) hours. 07/13/21  Yes Rileyann Florance R, NP  fluticasone (FLONASE) 50 MCG/ACT nasal spray Place 1 spray into both nostrils daily. 07/13/21  Yes Jaszmine Navejas R, NP  guaiFENesin-dextromethorphan (ROBITUSSIN DM) 100-10 MG/5ML syrup Take 5 mLs by mouth every 4 (four) hours as needed for cough. 07/13/21  Yes Lovelyn Sheeran R, NP  albuterol (VENTOLIN HFA) 108 (90 Base) MCG/ACT inhaler Inhale 1-2 puffs into the lungs every 6 (six) hours as needed for wheezing or shortness of breath. 03/27/21   Vanessa Kick, MD  amoxicillin (AMOXIL) 875 MG tablet Take 1 tablet (875 mg total) by mouth 2 (two) times daily for 10 days. 07/09/21 07/19/21  Odis Luster, FNP  diphenhydrAMINE (SOMINEX) 25 MG tablet Take 25-50 mg by mouth daily as needed (headache).    [provider]  hydrocortisone (ANUSOL-HC) 2.5 % rectal cream Place 1 application rectally 2 (two) times daily. Patient taking differently: Place 1 application rectally 2 (two) times daily as needed for hemorrhoids. 12/18/20   Hughie Closs, PA-C  ibuprofen (ADVIL) 800 MG tablet Take  1 tablet (800 mg total) by mouth 3 (three) times daily. Patient taking differently: Take 800 mg by mouth every 8 (eight) hours as needed for headache or mild pain. 02/23/21   Hughie Closs, PA-C  Levonorgestrel (MIRENA, 52 MG, IU) 1 each by Intrauterine route once.     [provider]  nortriptyline (PAMELOR) 10 MG capsule Take 1 capsule (10 mg total) by mouth at bedtime. 05/19/21   Tomi Likens, Adam R, DO  ondansetron (ZOFRAN-ODT) 4 MG disintegrating tablet Take 1 tablet (4 mg total) by mouth every 8 (eight) hours as needed for nausea or vomiting. 07/13/21   Hans Eden, NP  Rimegepant Sulfate (NURTEC) 75 MG TBDP Take 75 mg by mouth daily as needed (Maximum 1 tablet in 24 hours). 05/19/21   Pieter Partridge, DO  Spacer/Aero-Holding Chambers DEVI Use spacer as directed with albuterol metered-dose inhaler 06/08/20    Jacqulyn Cane, MD  tiZANidine (ZANAFLEX) 2 MG tablet Take 2 mg by mouth every 6 (six) hours as needed for muscle spasms.    [provider]  cetirizine (ZYRTEC) 10 MG tablet Take 1 tablet (10 mg total) by mouth daily. 06/06/20 12/18/20  Loura Halt A, NP  esomeprazole (NEXIUM) 40 MG capsule Take 1 capsule (40 mg total) by mouth daily. Patient not taking: Reported on 09/16/2019 07/12/19 10/12/19  Vanessa Kick, MD  loratadine (CLARITIN) 10 MG tablet Take 1 tablet (10 mg total) by mouth daily. Patient not taking: Reported on 12/03/2019 10/12/19 01/16/20  Wieters, Elesa Hacker, PA-C  promethazine (PHENERGAN) 25 MG suppository Place 1 suppository (25 mg total) rectally every 6 (six) hours as needed for nausea or vomiting. Patient not taking: Reported on 04/19/2020 12/17/19 06/06/20  Tacy Learn, PA-C    Family History Family History  Problem Relation Age of Onset   Cancer Father        liver   Diabetes Father    Hypertension Father    Hypertension Mother    Hypertension Maternal Grandmother    Kidney disease Maternal Grandmother    Diabetes Paternal Grandmother    Hypertension Paternal Grandmother    Ulcers Brother    Crohn's disease Brother     Social History Social History   Tobacco Use   Smoking status: Every Day    Packs/day: 0.50    Types: Cigarettes   Smokeless tobacco: Never   Tobacco comments:    down to 5 cigarrettes/day  Vaping Use   Vaping Use: Never used  Substance Use Topics   Alcohol use: Yes    Comment: occassionally    Drug use: Yes    Types: Marijuana     Allergies   [name] [camphor-menthol], Yellow dyes (non-tartrazine), and Latex   Review of Systems Review of Systems Defer to HPI   Physical Exam Triage Vital Signs ED Triage Vitals  Enc Vitals Group     BP 07/13/21 1727 121/89     Pulse Rate 07/13/21 1727 83     Resp 07/13/21 1727 18     Temp 07/13/21 1727 98.4 F (36.9 C)     Temp src --      SpO2 07/13/21 1727 98 %     Weight --       Height --      Head Circumference --      Peak Flow --      Pain Score 07/13/21 1723 10     Pain Loc --      Pain Edu? --  Excl. in [place]? --    No data found.  Updated Vital Signs BP 121/89   Pulse 83   Temp 98.4 F (36.9 C)   Resp 18   LMP 06/24/2021   SpO2 98%   Visual Acuity Right Eye Distance:   Left Eye Distance:   Bilateral Distance:    Right Eye Near:   Left Eye Near:    Bilateral Near:     Physical Exam Constitutional:      Appearance: She is ill-appearing.  HENT:     Head: Normocephalic.     Right Ear: Tympanic membrane, ear canal and external ear normal.     Left Ear: Tympanic membrane, ear canal and external ear normal.     Nose: Congestion and rhinorrhea present.     Mouth/Throat:     Mouth: Mucous membranes are moist.     Pharynx: Posterior oropharyngeal erythema present.  Eyes:     Extraocular Movements: Extraocular movements intact.  Cardiovascular:     Rate and Rhythm: Normal rate and regular rhythm.     Pulses: Normal pulses.     Heart sounds: Normal heart sounds.  Pulmonary:     Effort: Pulmonary effort is normal.     Breath sounds: Normal breath sounds.  Musculoskeletal:     Cervical back: Normal range of motion.  Lymphadenopathy:     Cervical: Cervical adenopathy present.  Skin:    General: Skin is warm and dry.  Neurological:     Mental Status: She is alert and oriented to person, place, and time. Mental status is at baseline.  Psychiatric:        Mood and Affect: Mood normal.        Behavior: Behavior normal.     UC Treatments / Results  Labs (all labs ordered are listed, but only abnormal results are displayed) Labs Reviewed - No data to display  EKG   Radiology No results found.  Procedures Procedures (including critical care time)  Medications Ordered in UC Medications  ketorolac (TORADOL) 30 MG/ML injection 30 mg (30 mg Intramuscular Given 07/13/21 1757)  dexamethasone (DECADRON) injection 10 mg (10 mg  Intramuscular Given 07/13/21 1756)  metoCLOPramide (REGLAN) injection 10 mg (10 mg Intramuscular Given 07/13/21 1757)  SUMAtriptan (IMITREX) injection 6 mg (6 mg Subcutaneous Given 07/13/21 1756)    Initial Impression / Assessment and Plan / UC Course  I have reviewed the triage vital signs and the nursing notes.  Pertinent labs & imaging results that were available during my care of the patient were reviewed by me and considered in my medical decision making (see chart for details).  Viral URI with cough  Will not retest strep, COVID or flu today due to recent negative results for same illness  1.  Toradol 30 mg IM now, Decadron 10 mg IM now Imitrex 6 mg subcutaneous,Reglan 10 mg IM now 2.  Has continued use of current migraine treatment 3.  Tessalon 100 mg every 8 hours. 4.  Robitussin-DM 100-10 mg / 5 mL every 4 hours as needed  5.  Flonase 50 mcg 1 spray each nare daily 6.  Zofran 4 mg ODT every 8 hours as needed 7.  Return precautions given for follow-up in urgent care for worsening or persistent symptoms Final Clinical Impressions(s) / UC Diagnoses   Final diagnoses:  Viral URI with cough     Discharge Instructions      Continue use of prescribed medications for migraine  You may take Tessalon every 8  hours as needed for cough  You can take Flonase 1 spray in each nare every morning to help with congestion  You may take Robitussin-DM 5 mils every 4 hours as needed for cough and congestion  You may use Zofran underneath the tongue every 8 hours as needed for nausea  You can take the Tessalon perles as needed for cough.  You can also take the Promethazine DM as needed for cough at night.  Promethazine DM can make you sleepy so don't take it prior to driving.   You can take Tylenol and/or Ibuprofen as needed for fever reduction and pain relief.   For cough: honey 1/2 to 1 teaspoon (you can dilute the honey in water or another fluid).  You can use a humidifier for chest  congestion and cough.  If you don't have a humidifier, you can sit in the bathroom with the hot shower running.      For sore throat: try warm salt water gargles, cepacol lozenges, throat spray, warm tea or water with lemon/honey, popsicles or ice, or OTC cold relief medicine for throat discomfort.   For congestion: take a daily anti-histamine like Zyrtec, Claritin, and a oral decongestant, such as pseudoephedrine.     It is important to stay hydrated: drink plenty of fluids (water, gatorade/powerade/pedialyte, juices, or teas) to keep your throat moisturized and help further relieve irritation/discomfort.       ED Prescriptions     Medication Sig Dispense Auth. Provider   benzonatate (TESSALON) 100 MG capsule Take 1 capsule (100 mg total) by mouth every 8 (eight) hours. 21 capsule Donell Sliwinski R, NP   guaiFENesin-dextromethorphan (ROBITUSSIN DM) 100-10 MG/5ML syrup Take 5 mLs by mouth every 4 (four) hours as needed for cough. 118 mL Radley Teston R, NP   ondansetron (ZOFRAN-ODT) 4 MG disintegrating tablet Take 1 tablet (4 mg total) by mouth every 8 (eight) hours as needed for nausea or vomiting. 20 tablet Caralee Morea R, NP   fluticasone (FLONASE) 50 MCG/ACT nasal spray Place 1 spray into both nostrils daily. 11.1 mL Hans Eden, NP      PDMP not reviewed this encounter.   Hans Eden, NP 07/14/21 1115

## 2021-07-18 DIAGNOSIS — H5213 Myopia, bilateral: Secondary | ICD-10-CM | POA: Diagnosis not present

## 2021-07-24 ENCOUNTER — Other Ambulatory Visit: Payer: Self-pay

## 2021-07-24 ENCOUNTER — Encounter: Payer: Self-pay | Admitting: Family Medicine

## 2021-07-24 ENCOUNTER — Emergency Department (INDEPENDENT_AMBULATORY_CARE_PROVIDER_SITE_OTHER)
Admission: EM | Admit: 2021-07-24 | Discharge: 2021-07-24 | Disposition: A | Discharge status: Home / Self Care | Payer: Medicaid Other | Source: Home / Self Care

## 2021-07-24 ENCOUNTER — Ambulatory Visit: Payer: Medicaid Other | Admitting: Neurology

## 2021-07-24 DIAGNOSIS — I959 Hypotension, unspecified: Secondary | ICD-10-CM | POA: Diagnosis not present

## 2021-07-24 DIAGNOSIS — R072 Precordial pain: Secondary | ICD-10-CM | POA: Diagnosis not present

## 2021-07-24 DIAGNOSIS — R079 Chest pain, unspecified: Secondary | ICD-10-CM

## 2021-07-24 DIAGNOSIS — R071 Chest pain on breathing: Secondary | ICD-10-CM

## 2021-07-24 DIAGNOSIS — M25519 Pain in unspecified shoulder: Secondary | ICD-10-CM | POA: Diagnosis not present

## 2021-07-24 DIAGNOSIS — R7989 Other specified abnormal findings of blood chemistry: Secondary | ICD-10-CM | POA: Diagnosis not present

## 2021-07-24 DIAGNOSIS — R0789 Other chest pain: Secondary | ICD-10-CM | POA: Diagnosis not present

## 2021-07-24 MED ORDER — ONDANSETRON 4 MG PO TBDP
4.0000 mg | ORAL_TABLET | Freq: Once | ORAL | Status: AC
  Administered 2021-07-24: 4 mg via ORAL

## 2021-07-24 NOTE — Discharge Instructions (Addendum)
Patient sent via EMS to [facility] for immediate evaluation of chest pain and pain with breathing.

## 2021-07-24 NOTE — ED Triage Notes (Signed)
Sharp pain to left side of chest -started 50 min pta  Pt had eaten fries prior to attack  HX of GERD - does not feel like that  Gall bladder issues in high school - no surgery

## 2021-07-24 NOTE — ED Provider Notes (Signed)
[facility]    CSN: [id number] Arrival date & time: 07/24/21  1739      History   Chief Complaint Chief Complaint  Patient presents with   Chest Pain    HPI Michele Avila is a 30 y.o. female.   HPI 30 year old female presents with sharp left-sided chest pain w/nausea for 1 hour.  Patient was evaluated on 07/13/2021 was found to have viral URI with cough.  Patient has history of GERD and reports this is not GERD like symptoms for her.  Past Medical History:  Diagnosis Date   Anemia    Asthma    hx chronic bronchitis/exercise induced asthma as pre teen only   COVID 03/2021   no COVID vaccine, also had COVID in 2021   Environmental allergies    Infection    UTI   Migraine    Seasonal allergies    Wrist fracture, right     Patient Active Problem List   Diagnosis Date Noted   Acute pain of left knee 05/24/2017   Pain in right wrist 05/24/2017   Mirena intrauterine device in place 03/02/2016    Past Surgical History:  Procedure Laterality Date   CESAREAN SECTION  2008   CESAREAN SECTION N/A 06/15/2013   Procedure: CESAREAN SECTION;  Surgeon: Allyn Kenner, DO;  Location: [facility];  Service: Obstetrics;  Laterality: N/A;   DILATION AND CURETTAGE OF UTERUS  08/2011   FOOT SURGERY  2017   INDUCED ABORTION      OB History     Gravida  5   Para  2   Term  2   Preterm      AB  3   Living  2      SAB      IAB  2   Ectopic      Multiple      Live Births  1            Home Medications    Prior to Admission medications   Medication Sig Start Date End Date Taking? Authorizing Provider  albuterol (VENTOLIN HFA) 108 (90 Base) MCG/ACT inhaler Inhale 1-2 puffs into the lungs every 6 (six) hours as needed for wheezing or shortness of breath. 03/27/21   Vanessa Kick, MD  benzonatate (TESSALON) 100 MG capsule Take 1 capsule (100 mg total) by mouth every 8 (eight) hours. 07/13/21   Hans Eden, NP  diphenhydrAMINE (SOMINEX) 25 MG tablet  Take 25-50 mg by mouth daily as needed (headache).    [provider]  fluticasone (FLONASE) 50 MCG/ACT nasal spray Place 1 spray into both nostrils daily. 07/13/21   White, Leitha Schuller, NP  guaiFENesin-dextromethorphan (ROBITUSSIN DM) 100-10 MG/5ML syrup Take 5 mLs by mouth every 4 (four) hours as needed for cough. 07/13/21   White, Leitha Schuller, NP  hydrocortisone (ANUSOL-HC) 2.5 % rectal cream Place 1 application rectally 2 (two) times daily. Patient taking differently: Place 1 application rectally 2 (two) times daily as needed for hemorrhoids. 12/18/20   Hughie Closs, PA-C  ibuprofen (ADVIL) 800 MG tablet Take 1 tablet (800 mg total) by mouth 3 (three) times daily. Patient taking differently: Take 800 mg by mouth every 8 (eight) hours as needed for headache or mild pain. 02/23/21   Hughie Closs, PA-C  Levonorgestrel (MIRENA, 52 MG, IU) 1 each by Intrauterine route once.  Patient not taking: Reported on 07/24/2021    [provider]  nortriptyline (PAMELOR) 10 MG capsule Take  1 capsule (10 mg total) by mouth at bedtime. 05/19/21   Tomi Likens, Adam R, DO  ondansetron (ZOFRAN-ODT) 4 MG disintegrating tablet Take 1 tablet (4 mg total) by mouth every 8 (eight) hours as needed for nausea or vomiting. 07/13/21   Hans Eden, NP  Rimegepant Sulfate (NURTEC) 75 MG TBDP Take 75 mg by mouth daily as needed (Maximum 1 tablet in 24 hours). 05/19/21   Pieter Partridge, DO  Spacer/Aero-Holding Chambers DEVI Use spacer as directed with albuterol metered-dose inhaler 06/08/20   Jacqulyn Cane, MD  tiZANidine (ZANAFLEX) 2 MG tablet Take 2 mg by mouth every 6 (six) hours as needed for muscle spasms. Patient not taking: Reported on 07/24/2021    [provider]  cetirizine (ZYRTEC) 10 MG tablet Take 1 tablet (10 mg total) by mouth daily. 06/06/20 12/18/20  Loura Halt A, NP  esomeprazole (NEXIUM) 40 MG capsule Take 1 capsule (40 mg total) by mouth daily. Patient not taking: Reported on 09/16/2019  07/12/19 10/12/19  Vanessa Kick, MD  loratadine (CLARITIN) 10 MG tablet Take 1 tablet (10 mg total) by mouth daily. Patient not taking: Reported on 12/03/2019 10/12/19 01/16/20  Wieters, Elesa Hacker, PA-C  promethazine (PHENERGAN) 25 MG suppository Place 1 suppository (25 mg total) rectally every 6 (six) hours as needed for nausea or vomiting. Patient not taking: Reported on 04/19/2020 12/17/19 06/06/20  Tacy Learn, PA-C    Family History Family History  Problem Relation Age of Onset   Cancer Father        liver   Diabetes Father    Hypertension Father    Hypertension Mother    Hypertension Maternal Grandmother    Kidney disease Maternal Grandmother    Diabetes Paternal Grandmother    Hypertension Paternal Grandmother    Ulcers Brother    Crohn's disease Brother     Social History Social History   Tobacco Use   Smoking status: Every Day    Packs/day: 0.50    Types: Cigarettes   Smokeless tobacco: Never   Tobacco comments:    down to 5 cigarrettes/day  Vaping Use   Vaping Use: Never used  Substance Use Topics   Alcohol use: Yes    Comment: occassionally    Drug use: Yes    Types: Marijuana     Allergies   [name] [camphor-menthol], Yellow dyes (non-tartrazine), and Latex   Review of Systems Review of Systems  Respiratory:         Pain with breathing for 1 hour  Cardiovascular:  Positive for chest pain.  All other systems reviewed and are negative.   Physical Exam Triage Vital Signs ED Triage Vitals  Enc Vitals Group     BP      Pulse      Resp      Temp      Temp src      SpO2      Weight      Height      Head Circumference      Peak Flow      Pain Score      Pain Loc      Pain Edu?      Excl. in [place]?    No data found.  Updated Vital Signs BP 124/89 (BP Location: Right Arm)   Pulse (!) 106   Temp 99.5 F (37.5 C) (Oral)   Resp 18   Ht 5' 9.5" (1.765 m)   Wt 250 lb (113.4 kg)  LMP 06/24/2021   SpO2 100%   BMI 36.39 kg/m   Physical  Exam Vitals and nursing note reviewed.  Constitutional:      General: She is not in acute distress.    Appearance: Normal appearance. She is obese. She is ill-appearing. She is not toxic-appearing or diaphoretic.  HENT:     Head: Normocephalic and atraumatic.     Mouth/Throat:     Mouth: Mucous membranes are moist.     Pharynx: Oropharynx is clear.  Eyes:     Extraocular Movements: Extraocular movements intact.     Conjunctiva/sclera: Conjunctivae normal.     Pupils: Pupils are equal, round, and reactive to light.  Cardiovascular:     Rate and Rhythm: Regular rhythm. Tachycardia present.     Pulses: Normal pulses.     Heart sounds: Normal heart sounds. No murmur heard. Pulmonary:     Effort: Pulmonary effort is normal.     Breath sounds: No wheezing, rhonchi or rales.     Comments: No adventitious breath sounds noted, patient reports increased left-sided chest pain with breathing Musculoskeletal:        General: Normal range of motion.  Skin:    General: Skin is warm and dry.  Neurological:     General: No focal deficit present.     Mental Status: She is alert and oriented to person, place, and time. Mental status is at baseline.  Psychiatric:        Mood and Affect: Mood normal.        Behavior: Behavior normal.        Thought Content: Thought content normal.     UC Treatments / Results  Labs (all labs ordered are listed, but only abnormal results are displayed) Labs Reviewed - No data to display  EKG   Radiology No results found.  Procedures Procedures (including critical care time)  Medications Ordered in UC Medications  ondansetron (ZOFRAN-ODT) disintegrating tablet 4 mg (4 mg Oral Given 07/24/21 1806)    Initial Impression / Assessment and Plan / UC Course  I have reviewed the triage vital signs and the nursing notes.  Pertinent labs & imaging results that were available during my care of the patient were reviewed by me and considered in my medical  decision making (see chart for details).    MDM: 1.  Chest pain, unspecified type; 2.  Chest pain varying with breathing-Patient sent via EMS to [facility] for immediate evaluation of chest pain and pain with breathing.  Rule out PE or other. Final Clinical Impressions(s) / UC Diagnoses   Final diagnoses:  Chest pain, unspecified type  Chest pain varying with breathing     Discharge Instructions      Patient sent via EMS to [facility] for immediate evaluation of chest pain and pain with breathing.      ED Prescriptions   None    PDMP not reviewed this encounter.   Eliezer Lofts, [place] 07/24/21 [phone]

## 2021-07-24 NOTE — ED Notes (Signed)
Patient is being discharged from the Urgent Care and sent to the Emergency Department via [facility] . Per M.Ragan, patient is in need of higher level of care due to pain level & need for imaging and stat labs to r/o PE. Patient is aware and verbalizes understanding of plan of care.  Vitals:   07/24/21 1754  BP: 124/89  Pulse: (!) 106  Resp: 18  Temp: 99.5 F (37.5 C)  SpO2: 100%   EMS called at [facility]

## 2021-08-06 DIAGNOSIS — H5213 Myopia, bilateral: Secondary | ICD-10-CM | POA: Diagnosis not present

## 2021-08-27 ENCOUNTER — Telehealth: Payer: Self-pay | Admitting: Neurology

## 2021-08-27 ENCOUNTER — Ambulatory Visit
Admission: EM | Admit: 2021-08-27 | Discharge: 2021-08-27 | Disposition: A | Discharge status: Home / Self Care | Payer: Medicaid Other | Attending: Physician Assistant | Admitting: Physician Assistant

## 2021-08-27 ENCOUNTER — Other Ambulatory Visit: Payer: Self-pay

## 2021-08-27 DIAGNOSIS — G43909 Migraine, unspecified, not intractable, without status migrainosus: Secondary | ICD-10-CM

## 2021-08-27 MED ORDER — KETOROLAC TROMETHAMINE 60 MG/2ML IM SOLN
60.0000 mg | Freq: Once | INTRAMUSCULAR | Status: AC
  Administered 2021-08-27: 60 mg via INTRAMUSCULAR

## 2021-08-27 MED ORDER — ONDANSETRON 4 MG PO TBDP
4.0000 mg | ORAL_TABLET | Freq: Once | ORAL | Status: AC
  Administered 2021-08-27: 4 mg via ORAL

## 2021-08-27 NOTE — ED Triage Notes (Signed)
Pt c/o migraine onset this morning. States her pcp worked 1/2 day today and she could not receive care. States she usually gets a "migraine cocktail."

## 2021-08-27 NOTE — Discharge Instructions (Addendum)
Follow up with PCP if headache persists.

## 2021-08-27 NOTE — ED Provider Notes (Signed)
[facility]    CSN: [id number] Arrival date & time: 08/27/21  1544      History   Chief Complaint Chief Complaint  Patient presents with   Migraine    HPI Michele Avila is a 30 y.o. female.   Patient is here today for evaluation of migraine headache that started today. She reports that she tried to get in with PCP but they only worked half a day today. She is requesting migraine cocktail as this has helped in the past. No differences in her symptoms compared to her prior migraines.   The history is provided by the patient.  Migraine Associated symptoms include headaches. Pertinent negatives include no shortness of breath.   Past Medical History:  Diagnosis Date   Anemia    Asthma    hx chronic bronchitis/exercise induced asthma as pre teen only   COVID 03/2021   no COVID vaccine, also had COVID in 2021   Environmental allergies    Infection    UTI   Migraine    Seasonal allergies    Wrist fracture, right     Patient Active Problem List   Diagnosis Date Noted   Acute pain of left knee 05/24/2017   Pain in right wrist 05/24/2017   Mirena intrauterine device in place 03/02/2016    Past Surgical History:  Procedure Laterality Date   CESAREAN SECTION  2008   CESAREAN SECTION N/A 06/15/2013   Procedure: CESAREAN SECTION;  Surgeon: Allyn Kenner, DO;  Location: [facility];  Service: Obstetrics;  Laterality: N/A;   DILATION AND CURETTAGE OF UTERUS  08/2011   FOOT SURGERY  2017   INDUCED ABORTION      OB History     Gravida  5   Para  2   Term  2   Preterm      AB  3   Living  2      SAB      IAB  2   Ectopic      Multiple      Live Births  1            Home Medications    Prior to Admission medications   Medication Sig Start Date End Date Taking? Authorizing Provider  albuterol (VENTOLIN HFA) 108 (90 Base) MCG/ACT inhaler Inhale 1-2 puffs into the lungs every 6 (six) hours as needed for wheezing or shortness of breath.  03/27/21   Vanessa Kick, MD  benzonatate (TESSALON) 100 MG capsule Take 1 capsule (100 mg total) by mouth every 8 (eight) hours. 07/13/21   Hans Eden, NP  diphenhydrAMINE (SOMINEX) 25 MG tablet Take 25-50 mg by mouth daily as needed (headache).    [provider]  fluticasone (FLONASE) 50 MCG/ACT nasal spray Place 1 spray into both nostrils daily. 07/13/21   White, Leitha Schuller, NP  guaiFENesin-dextromethorphan (ROBITUSSIN DM) 100-10 MG/5ML syrup Take 5 mLs by mouth every 4 (four) hours as needed for cough. 07/13/21   White, Leitha Schuller, NP  hydrocortisone (ANUSOL-HC) 2.5 % rectal cream Place 1 application rectally 2 (two) times daily. Patient taking differently: Place 1 application rectally 2 (two) times daily as needed for hemorrhoids. 12/18/20   Hughie Closs, PA-C  ibuprofen (ADVIL) 800 MG tablet Take 1 tablet (800 mg total) by mouth 3 (three) times daily. Patient taking differently: Take 800 mg by mouth every 8 (eight) hours as needed for headache or mild pain. 02/23/21   Hughie Closs, PA-C  Levonorgestrel (MIRENA,  52 MG, IU) 1 each by Intrauterine route once.  Patient not taking: Reported on 07/24/2021    [provider]  nortriptyline (PAMELOR) 10 MG capsule Take 1 capsule (10 mg total) by mouth at bedtime. 05/19/21   Tomi Likens, Adam R, DO  ondansetron (ZOFRAN-ODT) 4 MG disintegrating tablet Take 1 tablet (4 mg total) by mouth every 8 (eight) hours as needed for nausea or vomiting. 07/13/21   Hans Eden, NP  Rimegepant Sulfate (NURTEC) 75 MG TBDP Take 75 mg by mouth daily as needed (Maximum 1 tablet in 24 hours). 05/19/21   Pieter Partridge, DO  Spacer/Aero-Holding Chambers DEVI Use spacer as directed with albuterol metered-dose inhaler 06/08/20   Jacqulyn Cane, MD  tiZANidine (ZANAFLEX) 2 MG tablet Take 2 mg by mouth every 6 (six) hours as needed for muscle spasms. Patient not taking: Reported on 07/24/2021    [provider]  cetirizine (ZYRTEC) 10 MG tablet Take  1 tablet (10 mg total) by mouth daily. 06/06/20 12/18/20  Loura Halt A, NP  esomeprazole (NEXIUM) 40 MG capsule Take 1 capsule (40 mg total) by mouth daily. Patient not taking: Reported on 09/16/2019 07/12/19 10/12/19  Vanessa Kick, MD  loratadine (CLARITIN) 10 MG tablet Take 1 tablet (10 mg total) by mouth daily. Patient not taking: Reported on 12/03/2019 10/12/19 01/16/20  Wieters, Elesa Hacker, PA-C  promethazine (PHENERGAN) 25 MG suppository Place 1 suppository (25 mg total) rectally every 6 (six) hours as needed for nausea or vomiting. Patient not taking: Reported on 04/19/2020 12/17/19 06/06/20  Tacy Learn, PA-C    Family History Family History  Problem Relation Age of Onset   Cancer Father        liver   Diabetes Father    Hypertension Father    Hypertension Mother    Hypertension Maternal Grandmother    Kidney disease Maternal Grandmother    Diabetes Paternal Grandmother    Hypertension Paternal Grandmother    Ulcers Brother    Crohn's disease Brother     Social History Social History   Tobacco Use   Smoking status: Every Day    Packs/day: 0.50    Types: Cigarettes   Smokeless tobacco: Never   Tobacco comments:    down to 5 cigarrettes/day  Vaping Use   Vaping Use: Never used  Substance Use Topics   Alcohol use: Yes    Comment: occassionally    Drug use: Yes    Types: Marijuana     Allergies   [name] [camphor-menthol], Yellow dyes (non-tartrazine), and Latex   Review of Systems Review of Systems  Constitutional:  Negative for chills and fever.  Eyes:  Positive for photophobia. Negative for discharge and redness.  Respiratory:  Negative for shortness of breath.   Gastrointestinal:  Positive for nausea. Negative for vomiting.  Neurological:  Positive for headaches. Negative for facial asymmetry and speech difficulty.    Physical Exam Triage Vital Signs ED Triage Vitals  Enc Vitals Group     BP 08/27/21 1614 114/83     Pulse Rate 08/27/21 1613 73      Resp 08/27/21 1613 18     Temp 08/27/21 1613 97.7 F (36.5 C)     Temp Source 08/27/21 1613 Oral     SpO2 08/27/21 1613 98 %     Weight --      Height --      Head Circumference --      Peak Flow --      Pain  Score 08/27/21 1613 10     Pain Loc --      Pain Edu? --      Excl. in [place]? --    No data found.  Updated Vital Signs BP 114/83 (BP Location: Right Arm)   Pulse 73   Temp 97.7 F (36.5 C) (Oral)   Resp 18   SpO2 98%      Physical Exam Vitals and nursing note reviewed.  Constitutional:      General: She is not in acute distress.    Appearance: Normal appearance. She is not ill-appearing.     Comments: Sitting in dark room  HENT:     Head: Normocephalic and atraumatic.     Nose: Nose normal.  Eyes:     General:        Right eye: No discharge.        Left eye: No discharge.     Extraocular Movements: Extraocular movements intact.     Conjunctiva/sclera: Conjunctivae normal.     Pupils: Pupils are equal, round, and reactive to light.  Cardiovascular:     Rate and Rhythm: Normal rate.  Pulmonary:     Effort: Pulmonary effort is normal.  Neurological:     Mental Status: She is alert.  Psychiatric:        Mood and Affect: Mood normal.        Behavior: Behavior normal.        Thought Content: Thought content normal.     UC Treatments / Results  Labs (all labs ordered are listed, but only abnormal results are displayed) Labs Reviewed - No data to display  EKG   Radiology No results found.  Procedures Procedures (including critical care time)  Medications Ordered in UC Medications  ketorolac (TORADOL) injection 60 mg (60 mg Intramuscular Given 08/27/21 1638)  ondansetron (ZOFRAN-ODT) disintegrating tablet 4 mg (4 mg Oral Given 08/27/21 1638)    Initial Impression / Assessment and Plan / UC Course  I have reviewed the triage vital signs and the nursing notes.  Pertinent labs & imaging results that were available during my care of the patient were  reviewed by me and considered in my medical decision making (see chart for details).   Toradol injection and ondansetron administered in office. Recommend follow up with any further concerns.   Final Clinical Impressions(s) / UC Diagnoses   Final diagnoses:  Migraine without status migrainosus, not intractable, unspecified migraine type     Discharge Instructions      Follow up with PCP if headache persists.      ED Prescriptions   None    PDMP not reviewed this encounter.   Francene Finders, PA-C 08/27/21 1724

## 2021-08-27 NOTE — Telephone Encounter (Addendum)
Pt seen Urgent care. Had advised pt an hour ago to go to the ED or Urgent care if we are unable to get back to her by time we close in regards to the Headache cocktail.Since Dr.Jaffe is out of the office.

## 2021-08-27 NOTE — Telephone Encounter (Signed)
Patient called and said she cannot get her Nurtec by mail order. She needs to get it from a local pharmacy.   Patient has a migraine today and wants to come in for a headache cocktail. She does have a driver.

## 2021-08-28 MED ORDER — NURTEC 75 MG PO TBDP: 75.0000 mg | ORAL_TABLET | Freq: Every day | ORAL | 5 refills | Status: DC | PRN

## 2021-08-28 NOTE — Telephone Encounter (Signed)
Per Dr.Jaffe send Nurtec to the pt local pharmacy.

## 2021-08-28 NOTE — Addendum Note (Signed)
Addended by: Venetia Night on: 08/28/2021 08:13 AM   Modules accepted: Orders

## 2021-09-18 ENCOUNTER — Encounter: Payer: Self-pay | Admitting: Neurology

## 2021-09-18 ENCOUNTER — Other Ambulatory Visit: Payer: Self-pay

## 2021-09-18 ENCOUNTER — Telehealth (INDEPENDENT_AMBULATORY_CARE_PROVIDER_SITE_OTHER): Payer: Medicaid Other | Admitting: Neurology

## 2021-09-18 VITALS — Ht 69.0 in | Wt 250.0 lb

## 2021-09-18 DIAGNOSIS — G43009 Migraine without aura, not intractable, without status migrainosus: Secondary | ICD-10-CM | POA: Diagnosis not present
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
## 2021-11-26 ENCOUNTER — Other Ambulatory Visit: Payer: Self-pay

## 2021-11-26 ENCOUNTER — Encounter (HOSPITAL_COMMUNITY): Payer: Self-pay

## 2021-11-26 ENCOUNTER — Ambulatory Visit (HOSPITAL_COMMUNITY)
Admission: EM | Admit: 2021-11-26 | Discharge: 2021-11-26 | Disposition: A | Discharge status: Home / Self Care | Payer: Medicaid Other | Attending: Family Medicine | Admitting: Family Medicine

## 2021-11-26 DIAGNOSIS — R233 Spontaneous ecchymoses: Secondary | ICD-10-CM | POA: Diagnosis not present

## 2021-11-26 NOTE — ED Triage Notes (Signed)
Pt presents with c/o possible allergic reaction.  States she had her tooth extracted under sedation at 1200 and states after she left her R arm was numb.

## 2021-11-27 NOTE — ED Provider Notes (Signed)
[facility]   [id number] 11/26/21 Arrival Time: 6759  ASSESSMENT & PLAN:  1. Traumatic petechiae    Distal RUE petechiae likely caused by BP cuff during tooth extraction. No worsening. Otherwise normal exam. She is comfortable with home observation.   Follow-up Information     Michele Avila .   Specialty: Emergency Medicine Why: If symptoms worsen in any way. Contact information: [address] [id number] [place] [phone]                Reviewed expectations re: course of current medical issues. Questions answered. Outlined signs and symptoms indicating need for more acute intervention. Understanding verbalized. After Visit Summary given.   SUBJECTIVE: History from: patient. Michele Avila is a 31 y.o. female who reports having tooth L upper tooth extracted today; under sedation. Noted discoloration and mild swelling of distal RUE after procedure; stable. "Just wanted to get checked out". No extremity sensation changes or weakness.  Denies: fever. Normal PO intake without n/v/d.  OBJECTIVE:  Vitals:   11/26/21 1951  BP: 132/86  Pulse: 90  Resp: 20  SpO2: 99%    General appearance: alert; no distress Lungs: speaks full sentences without difficulty; unlabored Extremities: mild swelling of distal LUE with petechiae over forearm and hand Skin: warm and dry Neurologic: normal gait; normal LUE sensation and strength Psychological: alert and cooperative; normal mood and affect   Allergies  Allergen Reactions   [name] [Camphor-Menthol] Swelling    SEVERE FACIAL SWELLING   Yellow Dyes (Non-Tartrazine)     migraines   Latex Hives and Rash    Past Medical History:  Diagnosis Date   Anemia    Asthma    hx chronic bronchitis/exercise induced asthma as pre teen only   COVID 03/2021   no COVID vaccine, also had COVID in 2021   Environmental allergies    Infection    UTI    Migraine    Seasonal allergies    Wrist fracture, right    Social History   Socioeconomic History   Marital status: Significant Other    Spouse name: Not on file   Number of children: 1   Years of education: Not on file   Highest education level: Some college, no degree  Occupational History    Employer: Materials engineer: [employer]  Tobacco Use   Smoking status: Every Day    Packs/day: 0.50    Types: Cigarettes   Smokeless tobacco: Never   Tobacco comments:    down to 5 cigarrettes/day  Vaping Use   Vaping Use: Never used  Substance and Sexual Activity   Alcohol use: Yes    Comment: occassionally    Drug use: Yes    Types: Marijuana   Sexual activity: Yes    Birth control/protection: I.U.D.  Other Topics Concern   Not on file  Social History Narrative   Patient is right-handed. She lives with her boyfriend and son in a 2 story house. She drinks one beverage with caffeine 2x week. She is acitive at work, loading and unloading trucks.   Social Determinants of Health   Financial Resource Strain: Not on file  Food Insecurity: Not on file  Transportation Needs: Not on file  Physical Activity: Not on file  Stress: Not on file  Social Connections: Not on file  Intimate Partner Violence: Not on file   Family History  Problem Relation Age of Onset   Cancer  Father        liver   Diabetes Father    Hypertension Father    Hypertension Mother    Hypertension Maternal Grandmother    Kidney disease Maternal Grandmother    Diabetes Paternal Grandmother    Hypertension Paternal Grandmother    Ulcers Brother    Crohn's disease Brother    Past Surgical History:  Procedure Laterality Date   CESAREAN SECTION  2008   CESAREAN SECTION N/A 06/15/2013   Procedure: CESAREAN SECTION;  Surgeon: Allyn Kenner, DO;  Location: [facility];  Service: Obstetrics;  Laterality: N/A;   DILATION AND CURETTAGE OF UTERUS  08/2011   FOOT SURGERY  2017   INDUCED ABORTION       Vanessa Kick, MD 11/27/21 [phone]

## 2022-01-07 ENCOUNTER — Encounter (HOSPITAL_COMMUNITY): Payer: Self-pay | Admitting: Emergency Medicine

## 2022-01-07 ENCOUNTER — Other Ambulatory Visit: Payer: Self-pay

## 2022-01-07 ENCOUNTER — Ambulatory Visit (HOSPITAL_COMMUNITY)
Admission: EM | Admit: 2022-01-07 | Discharge: 2022-01-07 | Disposition: A | Discharge status: Home / Self Care | Payer: Medicaid Other | Attending: Internal Medicine | Admitting: Internal Medicine

## 2022-01-07 DIAGNOSIS — J069 Acute upper respiratory infection, unspecified: Secondary | ICD-10-CM | POA: Insufficient documentation

## 2022-01-07 DIAGNOSIS — R051 Acute cough: Secondary | ICD-10-CM | POA: Diagnosis not present

## 2022-01-07 DIAGNOSIS — Z20822 Contact with and (suspected) exposure to covid-19: Secondary | ICD-10-CM | POA: Diagnosis not present

## 2022-01-07 DIAGNOSIS — J029 Acute pharyngitis, unspecified: Secondary | ICD-10-CM | POA: Diagnosis present

## 2022-01-07 LAB — SARS CORONAVIRUS 2 (TAT 6-24 HRS): SARS Coronavirus 2: NEGATIVE

## 2022-01-07 LAB — POCT RAPID STREP A, ED / UC: Streptococcus, Group A Screen (Direct): NEGATIVE

## 2022-01-07 NOTE — ED Triage Notes (Signed)
Pt reports nasal congestion, sore throat and bilateral ear drainage x3 days. Pt states symptoms began with an itchy ear.

## 2022-01-07 NOTE — Discharge Instructions (Addendum)
We have tested you for COVID and the results will likely come back tomorrow.  Your strep test was negative.  We have sent this for a confirmatory culture as well.  If you have difficulty breathing, chest pain, you are vomiting and can't keep any liquids down and you aren't urinating at least 50% of your normal amount, you should be seen at the emergency room right away.  If you aren't improving over the next week, please follow up with your regular medical provider.  For your congestion, you can use nasal saline spray.  You can also use a humidifier.  You can also use honey as needed for cough by the spoonful or in a warm liquid (do not give honey to an infant less than a year old).

## 2022-01-07 NOTE — ED Provider Notes (Signed)
[facility]    CSN: [id number] Arrival date & time: 01/07/22  3875      History   Chief Complaint Chief Complaint  Patient presents with   Ear Drainage   Sore Throat    HPI Michele Avila is a 31 y.o. female.   Sore throat Started 3 days ago At first reports that her right ear was itchy, then woke up the next day and felt that she was having congestion, sore throat, some slight drainage from her ear No fevers  Endorses congestion, rhinorrhea, sore throat,  Denies headache, fatigue, myalgias, nausea, vomiting, diarrhea, chest pain, shortness of breath Has been drinking normally, has normal UOP  No known sick contacts recently, but states that her son just got over strep a few weeks ago Has not been tested for COVID     Past Medical History:  Diagnosis Date   Anemia    Asthma    hx chronic bronchitis/exercise induced asthma as pre teen only   COVID 03/2021   no COVID vaccine, also had COVID in 2021   Environmental allergies    Infection    UTI   Migraine    Seasonal allergies    Wrist fracture, right     Patient Active Problem List   Diagnosis Date Noted   Acute pain of left knee 05/24/2017   Pain in right wrist 05/24/2017   Mirena intrauterine device in place 03/02/2016    Past Surgical History:  Procedure Laterality Date   CESAREAN SECTION  2008   CESAREAN SECTION N/A 06/15/2013   Procedure: CESAREAN SECTION;  Surgeon: Allyn Kenner, DO;  Location: [facility];  Service: Obstetrics;  Laterality: N/A;   DILATION AND CURETTAGE OF UTERUS  08/2011   FOOT SURGERY  2017   INDUCED ABORTION      OB History     Gravida  5   Para  2   Term  2   Preterm      AB  3   Living  2      SAB      IAB  2   Ectopic      Multiple      Live Births  1            Home Medications    Prior to Admission medications   Medication Sig Start Date End Date Taking? Authorizing Provider  albuterol (VENTOLIN HFA) 108 (90 Base) MCG/ACT inhaler  Inhale 1-2 puffs into the lungs every 6 (six) hours as needed for wheezing or shortness of breath. 03/27/21   Vanessa Kick, MD  benzonatate (TESSALON) 100 MG capsule Take 1 capsule (100 mg total) by mouth every 8 (eight) hours. Patient not taking: Reported on 09/18/2021 07/13/21   Hans Eden, NP  diphenhydrAMINE (SOMINEX) 25 MG tablet Take 25-50 mg by mouth daily as needed (headache). Patient not taking: Reported on 09/18/2021    [provider]  fluticasone (FLONASE) 50 MCG/ACT nasal spray Place 1 spray into both nostrils daily. Patient not taking: Reported on 09/18/2021 07/13/21   Hans Eden, NP  guaiFENesin-dextromethorphan (ROBITUSSIN DM) 100-10 MG/5ML syrup Take 5 mLs by mouth every 4 (four) hours as needed for cough. Patient not taking: Reported on 09/18/2021 07/13/21   Hans Eden, NP  hydrocortisone (ANUSOL-HC) 2.5 % rectal cream Place 1 application rectally 2 (two) times daily. Patient not taking: Reported on 09/18/2021 12/18/20   Hughie Closs, PA-C  ibuprofen (ADVIL) 800 MG tablet Take 1 tablet (800  mg total) by mouth 3 (three) times daily. Patient taking differently: Take 800 mg by mouth every 8 (eight) hours as needed for headache or mild pain. 02/23/21   Hughie Closs, PA-C  Levonorgestrel (MIRENA, 52 MG, IU) 1 each by Intrauterine route once.  Patient not taking: Reported on 07/24/2021    [provider]  nortriptyline (PAMELOR) 10 MG capsule Take 1 capsule (10 mg total) by mouth at bedtime. 05/19/21   Tomi Likens, Adam R, DO  ondansetron (ZOFRAN-ODT) 4 MG disintegrating tablet Take 1 tablet (4 mg total) by mouth every 8 (eight) hours as needed for nausea or vomiting. 07/13/21   Hans Eden, NP  Rimegepant Sulfate (NURTEC) 75 MG TBDP Take 75 mg by mouth daily as needed (Maximum 1 tablet in 24 hours). 08/28/21   Pieter Partridge, DO  Spacer/Aero-Holding Chambers DEVI Use spacer as directed with albuterol metered-dose inhaler Patient not taking:  Reported on 09/18/2021 06/08/20   Jacqulyn Cane, MD  tiZANidine (ZANAFLEX) 2 MG tablet Take 2 mg by mouth every 6 (six) hours as needed for muscle spasms. Patient not taking: No sig reported    [provider]  cetirizine (ZYRTEC) 10 MG tablet Take 1 tablet (10 mg total) by mouth daily. 06/06/20 12/18/20  Loura Halt A, NP  esomeprazole (NEXIUM) 40 MG capsule Take 1 capsule (40 mg total) by mouth daily. Patient not taking: Reported on 09/16/2019 07/12/19 10/12/19  Vanessa Kick, MD  loratadine (CLARITIN) 10 MG tablet Take 1 tablet (10 mg total) by mouth daily. Patient not taking: Reported on 12/03/2019 10/12/19 01/16/20  Wieters, Elesa Hacker, PA-C  promethazine (PHENERGAN) 25 MG suppository Place 1 suppository (25 mg total) rectally every 6 (six) hours as needed for nausea or vomiting. Patient not taking: Reported on 04/19/2020 12/17/19 06/06/20  Tacy Learn, PA-C    Family History Family History  Problem Relation Age of Onset   Cancer Father        liver   Diabetes Father    Hypertension Father    Hypertension Mother    Hypertension Maternal Grandmother    Kidney disease Maternal Grandmother    Diabetes Paternal Grandmother    Hypertension Paternal Grandmother    Ulcers Brother    Crohn's disease Brother     Social History Social History   Tobacco Use   Smoking status: Every Day    Packs/day: 0.50    Types: Cigarettes   Smokeless tobacco: Never   Tobacco comments:    down to 5 cigarrettes/day  Vaping Use   Vaping Use: Never used  Substance Use Topics   Alcohol use: Yes    Comment: occassionally    Drug use: Yes    Types: Marijuana     Allergies   [name] [camphor-menthol], Yellow dyes (non-tartrazine), and Latex   Review of Systems Review of Systems  All other systems reviewed and are negative. Per HPI  Physical Exam Triage Vital Signs ED Triage Vitals  Enc Vitals Group     BP 01/07/22 1011 125/86     Pulse Rate 01/07/22 1011 73     Resp 01/07/22  1011 18     Temp 01/07/22 1011 98 F (36.7 C)     Temp Source 01/07/22 1011 Oral     SpO2 01/07/22 1011 99 %     Weight 01/07/22 1010 250 lb (113.4 kg)     Height 01/07/22 1010 5\' 9"  (1.753 m)     Head Circumference --  Peak Flow --      Pain Score 01/07/22 1010 0     Pain Loc --      Pain Edu? --      Excl. in [place]? --    No data found.  Updated Vital Signs BP 125/86 (BP Location: Right Arm)    Pulse 73    Temp 98 F (36.7 C) (Oral)    Resp 18    Ht 5\' 9"  (1.753 m)    Wt 250 lb (113.4 kg)    SpO2 99%    BMI 36.92 kg/m   Visual Acuity Right Eye Distance:   Left Eye Distance:   Bilateral Distance:    Right Eye Near:   Left Eye Near:    Bilateral Near:     Physical Exam Constitutional:      General: She is not in acute distress.    Appearance: She is well-developed. She is not ill-appearing or toxic-appearing.  HENT:     Head: Normocephalic and atraumatic.     Right Ear: Tympanic membrane, ear canal and external ear normal. No drainage. Tympanic membrane is not erythematous.     Left Ear: Tympanic membrane, ear canal and external ear normal. No drainage. Tympanic membrane is not erythematous.     Nose: Nose normal. No congestion or rhinorrhea.     Mouth/Throat:     Mouth: Mucous membranes are moist.     Pharynx: Posterior oropharyngeal erythema present. No oropharyngeal exudate.     Tonsils: No tonsillar exudate or tonsillar abscesses.  Eyes:     Conjunctiva/sclera: Conjunctivae normal.  Cardiovascular:     Rate and Rhythm: Normal rate.  Pulmonary:     Effort: Pulmonary effort is normal. No respiratory distress.  Musculoskeletal:     Cervical back: Neck supple. No rigidity or tenderness.  Lymphadenopathy:     Cervical: No cervical adenopathy.  Skin:    General: Skin is warm and dry.     Capillary Refill: Capillary refill takes less than 2 seconds.  Neurological:     Mental Status: She is alert and oriented to person, place, and time.     UC Treatments /  Results  Labs (all labs ordered are listed, but only abnormal results are displayed) Labs Reviewed  CULTURE, GROUP A STREP ([place])  SARS CORONAVIRUS 2 (TAT 6-24 HRS)  POCT RAPID STREP A, ED / UC    EKG   Radiology No results found.  Procedures Procedures (including critical care time)  Medications Ordered in UC Medications - No data to display  Initial Impression / Assessment and Plan / UC Course  I have reviewed the triage vital signs and the nursing notes.  Pertinent labs & imaging results that were available during my care of the patient were reviewed by me and considered in my medical decision making (see chart for details).     VSS.  COVID test performed.  Flu test deferred as would not change management.  Rapid strep negative.  Culture also sent. Advised of OTC treatments and ED precautions, see AVS.     Final Clinical Impressions(s) / UC Diagnoses   Final diagnoses:  Viral URI with cough     Discharge Instructions      We have tested you for COVID and the results will likely come back tomorrow.  Your strep test was negative.  We have sent this for a confirmatory culture as well.  If you have difficulty breathing, chest pain, you are vomiting and can't keep any liquids  down and you aren't urinating at least 50% of your normal amount, you should be seen at the emergency room right away.  If you aren't improving over the next week, please follow up with your regular medical provider.  For your congestion, you can use nasal saline spray.  You can also use a humidifier.  You can also use honey as needed for cough by the spoonful or in a warm liquid (do not give honey to an infant less than a year old).      ED Prescriptions   None    PDMP not reviewed this encounter.   Cleophas Dunker, DO 01/07/22 1115

## 2022-01-09 LAB — CULTURE, GROUP A STREP (THRC)

## 2022-01-29 ENCOUNTER — Encounter (HOSPITAL_COMMUNITY): Payer: Self-pay

## 2022-01-29 ENCOUNTER — Ambulatory Visit (HOSPITAL_COMMUNITY)
Admission: EM | Admit: 2022-01-29 | Discharge: 2022-01-29 | Disposition: A | Discharge status: Home / Self Care | Payer: Medicaid Other | Attending: Family Medicine | Admitting: Family Medicine

## 2022-01-29 ENCOUNTER — Encounter: Payer: Self-pay | Admitting: Neurology

## 2022-01-29 ENCOUNTER — Other Ambulatory Visit: Payer: Self-pay

## 2022-01-29 DIAGNOSIS — R590 Localized enlarged lymph nodes: Secondary | ICD-10-CM | POA: Diagnosis not present

## 2022-01-29 MED ORDER — CEPHALEXIN 500 MG PO CAPS: 500.0000 mg | ORAL_CAPSULE | Freq: Three times a day (TID) | ORAL | 0 refills | Status: DC

## 2022-01-29 NOTE — ED Provider Notes (Signed)
[facility]    CSN: [id number] Arrival date & time: 01/29/22  0810      History   Chief Complaint Chief Complaint  Patient presents with   Otalgia    HPI Michele Avila is a 31 y.o. female.   Patient is here for pain behind the right ear.  Started this morning when she woke up.  It feels swollen, and stiff from swelling.  Pain/pressure.  She has not taken anything for it today.  She was sick for several weeks ago, and still has a cough from that.  No fevers/chills.  No skin lesions that she is aware of.   Past Medical History:  Diagnosis Date   Anemia    Asthma    hx chronic bronchitis/exercise induced asthma as pre teen only   COVID 03/2021   no COVID vaccine, also had COVID in 2021   Environmental allergies    Infection    UTI   Migraine    Seasonal allergies    Wrist fracture, right     Patient Active Problem List   Diagnosis Date Noted   Acute pain of left knee 05/24/2017   Pain in right wrist 05/24/2017   Mirena intrauterine device in place 03/02/2016    Past Surgical History:  Procedure Laterality Date   CESAREAN SECTION  2008   CESAREAN SECTION N/A 06/15/2013   Procedure: CESAREAN SECTION;  Surgeon: Allyn Kenner, DO;  Location: [facility];  Service: Obstetrics;  Laterality: N/A;   DILATION AND CURETTAGE OF UTERUS  08/2011   FOOT SURGERY  2017   INDUCED ABORTION      OB History     Gravida  5   Para  2   Term  2   Preterm      AB  3   Living  2      SAB      IAB  2   Ectopic      Multiple      Live Births  1            Home Medications    Prior to Admission medications   Medication Sig Start Date End Date Taking? Authorizing Provider  albuterol (VENTOLIN HFA) 108 (90 Base) MCG/ACT inhaler Inhale 1-2 puffs into the lungs every 6 (six) hours as needed for wheezing or shortness of breath. 03/27/21   Vanessa Kick, MD  benzonatate (TESSALON) 100 MG capsule Take 1 capsule (100 mg total) by mouth every 8 (eight)  hours. Patient not taking: Reported on 09/18/2021 07/13/21   Hans Eden, NP  diphenhydrAMINE (SOMINEX) 25 MG tablet Take 25-50 mg by mouth daily as needed (headache). Patient not taking: Reported on 09/18/2021    [provider]  fluticasone (FLONASE) 50 MCG/ACT nasal spray Place 1 spray into both nostrils daily. Patient not taking: Reported on 09/18/2021 07/13/21   Hans Eden, NP  guaiFENesin-dextromethorphan (ROBITUSSIN DM) 100-10 MG/5ML syrup Take 5 mLs by mouth every 4 (four) hours as needed for cough. Patient not taking: Reported on 09/18/2021 07/13/21   Hans Eden, NP  hydrocortisone (ANUSOL-HC) 2.5 % rectal cream Place 1 application rectally 2 (two) times daily. Patient not taking: Reported on 09/18/2021 12/18/20   Hughie Closs, PA-C  ibuprofen (ADVIL) 800 MG tablet Take 1 tablet (800 mg total) by mouth 3 (three) times daily. Patient taking differently: Take 800 mg by mouth every 8 (eight) hours as needed for headache or mild pain. 02/23/21   Hughie Closs,  PA-C  Levonorgestrel (MIRENA, 52 MG, IU) 1 each by Intrauterine route once.  Patient not taking: Reported on 07/24/2021    [provider]  nortriptyline (PAMELOR) 10 MG capsule Take 1 capsule (10 mg total) by mouth at bedtime. 05/19/21   Tomi Likens, Adam R, DO  ondansetron (ZOFRAN-ODT) 4 MG disintegrating tablet Take 1 tablet (4 mg total) by mouth every 8 (eight) hours as needed for nausea or vomiting. 07/13/21   Hans Eden, NP  Rimegepant Sulfate (NURTEC) 75 MG TBDP Take 75 mg by mouth daily as needed (Maximum 1 tablet in 24 hours). 08/28/21   Pieter Partridge, DO  Spacer/Aero-Holding Chambers DEVI Use spacer as directed with albuterol metered-dose inhaler Patient not taking: Reported on 09/18/2021 06/08/20   Jacqulyn Cane, MD  tiZANidine (ZANAFLEX) 2 MG tablet Take 2 mg by mouth every 6 (six) hours as needed for muscle spasms. Patient not taking: No sig reported    [provider]   cetirizine (ZYRTEC) 10 MG tablet Take 1 tablet (10 mg total) by mouth daily. 06/06/20 12/18/20  Loura Halt A, NP  esomeprazole (NEXIUM) 40 MG capsule Take 1 capsule (40 mg total) by mouth daily. Patient not taking: Reported on 09/16/2019 07/12/19 10/12/19  Vanessa Kick, MD  loratadine (CLARITIN) 10 MG tablet Take 1 tablet (10 mg total) by mouth daily. Patient not taking: Reported on 12/03/2019 10/12/19 01/16/20  Wieters, Elesa Hacker, PA-C  promethazine (PHENERGAN) 25 MG suppository Place 1 suppository (25 mg total) rectally every 6 (six) hours as needed for nausea or vomiting. Patient not taking: Reported on 04/19/2020 12/17/19 06/06/20  Tacy Learn, PA-C    Family History Family History  Problem Relation Age of Onset   Cancer Father        liver   Diabetes Father    Hypertension Father    Hypertension Mother    Hypertension Maternal Grandmother    Kidney disease Maternal Grandmother    Diabetes Paternal Grandmother    Hypertension Paternal Grandmother    Ulcers Brother    Crohn's disease Brother     Social History Social History   Tobacco Use   Smoking status: Every Day    Packs/day: 0.50    Types: Cigarettes   Smokeless tobacco: Never   Tobacco comments:    down to 5 cigarrettes/day  Vaping Use   Vaping Use: Never used  Substance Use Topics   Alcohol use: Yes    Comment: occassionally    Drug use: Yes    Types: Marijuana     Allergies   Camphor-menthol, [name] [camphor-menthol], Yellow dyes (non-tartrazine), and Latex   Review of Systems Review of Systems  Constitutional: Negative.   HENT:  Positive for ear pain.   Respiratory: Negative.    Cardiovascular: Negative.   Gastrointestinal: Negative.   Genitourinary: Negative.     Physical Exam Triage Vital Signs ED Triage Vitals  Enc Vitals Group     BP 01/29/22 0843 125/86     Pulse Rate 01/29/22 0843 95     Resp 01/29/22 0843 20     Temp 01/29/22 0843 98.6 F (37 C)     Temp Source 01/29/22 0843  Oral     SpO2 01/29/22 0843 96 %     Weight --      Height --      Head Circumference --      Peak Flow --      Pain Score 01/29/22 0840 10     Pain Loc --  Pain Edu? --      Excl. in [place]? --    No data found.  Updated Vital Signs BP 125/86 (BP Location: Left Arm)    Pulse 95    Temp 98.6 F (37 C) (Oral)    Resp 20    LMP 01/21/2022    SpO2 96%   Visual Acuity Right Eye Distance:   Left Eye Distance:   Bilateral Distance:    Right Eye Near:   Left Eye Near:    Bilateral Near:     Physical Exam Constitutional:      Appearance: Normal appearance.  HENT:     Right Ear: Hearing, tympanic membrane, ear canal and external ear normal.     Ears:     Comments: There is swelling/fullness behind the right ear that extends half way up the scalp and slightly posterior.  This area is tender;  Lymphadenopathy:     Cervical: No cervical adenopathy.     Upper Body:     Right upper body: No supraclavicular adenopathy.     Left upper body: No supraclavicular adenopathy.  Skin:    Findings: No abrasion, abscess, signs of injury or laceration.  Neurological:     Mental Status: She is alert.     UC Treatments / Results  Labs (all labs ordered are listed, but only abnormal results are displayed) Labs Reviewed - No data to display  EKG   Radiology No results found.  Procedures Procedures (including critical care time)  Medications Ordered in UC Medications - No data to display  Initial Impression / Assessment and Plan / UC Course  I have reviewed the triage vital signs and the nursing notes.  Pertinent labs & imaging results that were available during my care of the patient were reviewed by me and considered in my medical decision making (see chart for details).  Patient was seen today for pain and swelling behind the right ear.  This seems most typical for enlarged lymp nodes, but I don't see any obvious source of infection from the scalp.  However, will treat with  keflex for presumed infection, and advised to take motrin for pain/swelling.  If this does not resolve, or if she notes other areas of swelling, then please return for further evaluation.   Final Clinical Impressions(s) / UC Diagnoses   Final diagnoses:  Posterior auricular lymphadenopathy     Discharge Instructions      You were seen today for swelling and pain behind the right ear.  I think this is caused by swollen lymph nodes for an unknown reason.  I have sent out an antibiotic to take three times/day.  I recommend motrin '600mg'$  three times/day to help with pain and swelling.  You may also use an ice pack or heat pack.  If this is not improving, or if you develop other areas of swelling, then please return for further evaluation.  To find a new primary care provider please go to [email or web address] to find one.     ED Prescriptions     Medication Sig Dispense Auth. Provider   cephALEXin (KEFLEX) 500 MG capsule Take 1 capsule (500 mg total) by mouth 3 (three) times daily. 20 capsule Rondel Oh, MD      PDMP not reviewed this encounter.   Rondel Oh, MD 01/29/22 [phone]

## 2022-01-29 NOTE — Discharge Instructions (Addendum)
You were seen today for swelling and pain behind the right ear.  I think this is caused by swollen lymph nodes for an unknown reason.  I have sent out an antibiotic to take three times/day.  I recommend motrin '600mg'$  three times/day to help with pain and swelling.  You may also use an ice pack or heat pack.  ?If this is not improving, or if you develop other areas of swelling, then please return for further evaluation.  ?To find a new primary care provider please go to [email or web address] to find one.  ?

## 2022-01-29 NOTE — ED Triage Notes (Signed)
Pt c/o tenderness behind right ear, she states it feels swollen also.  ?Started: today  ?

## 2022-03-24 ENCOUNTER — Ambulatory Visit (INDEPENDENT_AMBULATORY_CARE_PROVIDER_SITE_OTHER): Payer: Medicaid Other | Admitting: Emergency Medicine

## 2022-03-24 ENCOUNTER — Encounter: Payer: Self-pay | Admitting: Emergency Medicine

## 2022-03-24 VITALS — BP 110/68 | HR 94 | Temp 99.0°F | Ht 69.0 in | Wt 256.1 lb

## 2022-03-24 DIAGNOSIS — Z130 Encounter for screening for diseases of the blood and blood-forming organs and certain disorders involving the immune mechanism: Secondary | ICD-10-CM

## 2022-03-24 DIAGNOSIS — Z1159 Encounter for screening for other viral diseases: Secondary | ICD-10-CM | POA: Diagnosis not present

## 2022-03-24 DIAGNOSIS — Z13228 Encounter for screening for other metabolic disorders: Secondary | ICD-10-CM | POA: Diagnosis not present

## 2022-03-24 DIAGNOSIS — Z1329 Encounter for screening for other suspected endocrine disorder: Secondary | ICD-10-CM | POA: Diagnosis not present

## 2022-03-24 DIAGNOSIS — Z13220 Encounter for screening for lipoid disorders: Secondary | ICD-10-CM

## 2022-03-24 DIAGNOSIS — Z0000 Encounter for general adult medical examination without abnormal findings: Secondary | ICD-10-CM

## 2022-03-24 LAB — CBC WITH DIFFERENTIAL/PLATELET
Basophils Absolute: 0 10*3/uL (ref 0.0–0.1)
Basophils Relative: 0.6 % (ref 0.0–3.0)
Eosinophils Absolute: 0.2 10*3/uL (ref 0.0–0.7)
Eosinophils Relative: 3 % (ref 0.0–5.0)
HCT: 41.4 % (ref 36.0–46.0)
Hemoglobin: 13.6 g/dL (ref 12.0–15.0)
Lymphocytes Relative: 31.3 % (ref 12.0–46.0)
Lymphs Abs: 2.2 10*3/uL (ref 0.7–4.0)
MCHC: 32.9 g/dL (ref 30.0–36.0)
MCV: 90 fl (ref 78.0–100.0)
Monocytes Absolute: 0.4 10*3/uL (ref 0.1–1.0)
Monocytes Relative: 5.1 % (ref 3.0–12.0)
Neutro Abs: 4.2 10*3/uL (ref 1.4–7.7)
Neutrophils Relative %: 60 % (ref 43.0–77.0)
Platelets: 261 10*3/uL (ref 150.0–400.0)
RBC: 4.6 Mil/uL (ref 3.87–5.11)
RDW: 13.6 % (ref 11.5–15.5)
WBC: 7 10*3/uL (ref 4.0–10.5)

## 2022-03-24 LAB — LIPID PANEL
Cholesterol: 220 mg/dL — ABNORMAL HIGH (ref 0–200)
HDL: 36.6 mg/dL — ABNORMAL LOW (ref >39.00)
LDL Cholesterol: 155 mg/dL — ABNORMAL HIGH (ref 0–99)
NonHDL: 183.18
Total CHOL/HDL Ratio: 6
Triglycerides: 143 mg/dL (ref 0.0–149.0)
VLDL: 28.6 mg/dL (ref 0.0–40.0)

## 2022-03-24 LAB — COMPREHENSIVE METABOLIC PANEL
ALT: 13 U/L (ref 0–35)
AST: 16 U/L (ref 0–37)
Albumin: 4.4 g/dL (ref 3.5–5.2)
Alkaline Phosphatase: 77 U/L (ref 39–117)
BUN: 8 mg/dL (ref 6–23)
CO2: 29 mEq/L (ref 19–32)
Calcium: 9.4 mg/dL (ref 8.4–10.5)
Chloride: 102 mEq/L (ref 96–112)
Creatinine, Ser: 1.21 mg/dL — ABNORMAL HIGH (ref 0.40–1.20)
GFR: 60.35 mL/min (ref >60.00)
Glucose, Bld: 92 mg/dL (ref 70–99)
Potassium: 4.1 mEq/L (ref 3.5–5.1)
Sodium: 136 mEq/L (ref 135–145)
Total Bilirubin: 0.5 mg/dL (ref 0.2–1.2)
Total Protein: 7.3 g/dL (ref 6.0–8.3)

## 2022-03-24 NOTE — Progress Notes (Signed)
Michele Avila ?31 y.o. ? ? ?Chief Complaint  ?Patient presents with  ? New Patient (Initial Visit)  ?  Multiple concerns  ? Referral  ?  Referral to dermatology for skin issues   ? Back Pain  ?  Lower back pain   ? ? ?HISTORY OF PRESENT ILLNESS: ?This is a 31 y.o. female first visit to this office.  Looking to establish care with PCP. ?Has history of chronic depression, obesity, chronic migraines, chronic back pain, skin chronic issues. ?Patient is a smoker. ?No other complaints or medical concerns today. ? ?Back Pain ?Pertinent negatives include no chest pain, fever or headaches.  ? ? ?Prior to Admission medications   ?Medication Sig Start Date End Date Taking? Authorizing Provider  ?albuterol (VENTOLIN HFA) 108 (90 Base) MCG/ACT inhaler Inhale 1-2 puffs into the lungs every 6 (six) hours as needed for wheezing or shortness of breath. 03/27/21  Yes Vanessa Kick, MD  ?ondansetron (ZOFRAN-ODT) 4 MG disintegrating tablet Take 1 tablet (4 mg total) by mouth every 8 (eight) hours as needed for nausea or vomiting. 07/13/21  Yes Hans Eden, NP  ?Rimegepant Sulfate (NURTEC) 75 MG TBDP Take 75 mg by mouth daily as needed (Maximum 1 tablet in 24 hours). 08/28/21  Yes Tomi Likens, Adam R, DO  ?cephALEXin (KEFLEX) 500 MG capsule Take 1 capsule (500 mg total) by mouth 3 (three) times daily. 01/29/22   Piontek, Junie Panning, MD  ?nortriptyline (PAMELOR) 10 MG capsule Take 1 capsule (10 mg total) by mouth at bedtime. 05/19/21   Pieter Partridge, DO  ?cetirizine (ZYRTEC) 10 MG tablet Take 1 tablet (10 mg total) by mouth daily. 06/06/20 12/18/20  Loura Halt A, NP  ?esomeprazole (NEXIUM) 40 MG capsule Take 1 capsule (40 mg total) by mouth daily. ?Patient not taking: Reported on 09/16/2019 07/12/19 10/12/19  Vanessa Kick, MD  ?loratadine (CLARITIN) 10 MG tablet Take 1 tablet (10 mg total) by mouth daily. ?Patient not taking: Reported on 12/03/2019 10/12/19 01/16/20  Wieters, Elesa Hacker, PA-C  ?promethazine (PHENERGAN) 25 MG suppository Place 1  suppository (25 mg total) rectally every 6 (six) hours as needed for nausea or vomiting. ?Patient not taking: Reported on 04/19/2020 12/17/19 06/06/20  Tacy Learn, PA-C  ? ? ?Allergies  ?Allergen Reactions  ? Camphor-Menthol Swelling  ?  SEVERE FACIAL SWELLING ?SEVERE FACIAL SWELLING  ? [name] [Camphor-Menthol] Swelling  ?  SEVERE FACIAL SWELLING  ? Yellow Dyes (Non-Tartrazine)   ?  migraines  ? Latex Hives and Rash  ? ? ?Patient Active Problem List  ? Diagnosis Date Noted  ? Mirena intrauterine device in place 03/02/2016  ? ? ?Past Medical History:  ?Diagnosis Date  ? Anemia   ? Asthma   ? hx chronic bronchitis/exercise induced asthma as pre teen only  ? COVID 03/2021  ? no COVID vaccine, also had COVID in 2021  ? Environmental allergies   ? Infection   ? UTI  ? Migraine   ? Seasonal allergies   ? Wrist fracture, right   ? ? ?Past Surgical History:  ?Procedure Laterality Date  ? CESAREAN SECTION  2008  ? CESAREAN SECTION N/A 06/15/2013  ? Procedure: CESAREAN SECTION;  Surgeon: Allyn Kenner, DO;  Location: [facility];  Service: Obstetrics;  Laterality: N/A;  ? DILATION AND CURETTAGE OF UTERUS  08/2011  ? FOOT SURGERY  2017  ? INDUCED ABORTION    ? ? ?Social History  ? ?Socioeconomic History  ? Marital status: Significant Other  ?  Spouse  name: Not on file  ? Number of children: 1  ? Years of education: Not on file  ? Highest education level: Some college, no degree  ?Occupational History  ?  Employer: [employer]  ?  Employer: [employer]  ?Tobacco Use  ? Smoking status: Every Day  ?  Packs/day: 0.50  ?  Types: Cigarettes  ? Smokeless tobacco: Never  ? Tobacco comments:  ?  down to 5 cigarrettes/day  ?Vaping Use  ? Vaping Use: Never used  ?Substance and Sexual Activity  ? Alcohol use: Yes  ?  Comment: occassionally   ? Drug use: Yes  ?  Types: Marijuana  ? Sexual activity: Yes  ?  Birth control/protection: I.U.D.  ?Other Topics Concern  ? Not on file  ?Social History Narrative  ? Patient is right-handed. She lives with  her boyfriend and son in a 2 story house. She drinks one beverage with caffeine 2x week. She is acitive at work, loading and unloading trucks.  ? ?Social Determinants of Health  ? ?Financial Resource Strain: Not on file  ?Food Insecurity: Not on file  ?Transportation Needs: Not on file  ?Physical Activity: Not on file  ?Stress: Not on file  ?Social Connections: Not on file  ?Intimate Partner Violence: Not on file  ? ? ?Family History  ?Problem Relation Age of Onset  ? Cancer Father   ?     liver  ? Diabetes Father   ? Hypertension Father   ? Hypertension Mother   ? Hypertension Maternal Grandmother   ? Kidney disease Maternal Grandmother   ? Diabetes Paternal Grandmother   ? Hypertension Paternal Grandmother   ? Ulcers Brother   ? Crohn's disease Brother   ? ? ? ?Review of Systems  ?Constitutional: Negative.  Negative for chills and fever.  ?HENT: Negative.  Negative for congestion and sore throat.   ?Eyes: Negative.   ?Respiratory: Negative.  Negative for cough.   ?Cardiovascular: Negative.  Negative for chest pain and palpitations.  ?Gastrointestinal: Negative.  Negative for nausea and vomiting.  ?Genitourinary: Negative.   ?Musculoskeletal:  Positive for back pain.  ?Skin: Negative.  Negative for rash.  ?Neurological: Negative.  Negative for dizziness and headaches.  ?All other systems reviewed and are negative. ? ?Today's Vitals  ? 03/24/22 1519  ?BP: 110/68  ?Pulse: 94  ?Temp: 99 ?F (37.2 ?C)  ?TempSrc: Oral  ?SpO2: 95%  ?Weight: 256 lb 2 oz (116.2 kg)  ?Height: '5\' 9"'$  (1.753 m)  ? ?Body mass index is 37.82 kg/m?. ? ?Physical Exam ?Vitals reviewed.  ?Constitutional:   ?   Appearance: Normal appearance. She is obese.  ?HENT:  ?   Head: Normocephalic.  ?   Right Ear: Tympanic membrane, ear canal and external ear normal.  ?   Left Ear: Tympanic membrane, ear canal and external ear normal.  ?   Mouth/Throat:  ?   Mouth: Mucous membranes are moist.  ?   Pharynx: Oropharynx is clear.  ?Eyes:  ?   Extraocular  Movements: Extraocular movements intact.  ?   Conjunctiva/sclera: Conjunctivae normal.  ?   Pupils: Pupils are equal, round, and reactive to light.  ?Cardiovascular:  ?   Rate and Rhythm: Normal rate and regular rhythm.  ?   Pulses: Normal pulses.  ?   Heart sounds: Normal heart sounds.  ?Pulmonary:  ?   Effort: Pulmonary effort is normal.  ?   Breath sounds: Normal breath sounds.  ?Musculoskeletal:  ?   Cervical  back: No tenderness.  ?Lymphadenopathy:  ?   Cervical: No cervical adenopathy.  ?Skin: ?   General: Skin is warm and dry.  ?   Capillary Refill: Capillary refill takes less than 2 seconds.  ?Neurological:  ?   General: No focal deficit present.  ?   Mental Status: She is alert and oriented to person, place, and time.  ?Psychiatric:     ?   Mood and Affect: Mood normal.     ?   Behavior: Behavior normal.  ? ? ? ?ASSESSMENT & PLAN: ?Patient with multiple complaints and chronic issues. ?Explained to patient I will not be able to take her as a new patient at the present time. ?She understands and will establish care with a different practice or different PCP. ? ?Problem List Items Addressed This Visit   ?None ?Visit Diagnoses   ? ? Routine general medical examination at a health care facility    -  Primary  ? Need for hepatitis C screening test      ? Relevant Orders  ? Hepatitis C antibody screen  ? Screening for deficiency anemia      ? Relevant Orders  ? CBC with Differential  ? Screening for lipoid disorders      ? Relevant Orders  ? Lipid panel  ? Screening for endocrine, metabolic and immunity disorder      ? Relevant Orders  ? Comprehensive metabolic panel  ? Hemoglobin A1c  ? ?  ? ?Patient Instructions  ?Health Maintenance, Female ?Adopting a healthy lifestyle and getting preventive care are important in promoting health and wellness. Ask your health care provider about: ?The right schedule for you to have regular tests and exams. ?Things you can do on your own to prevent diseases and keep yourself  healthy. ?What should I know about diet, weight, and exercise? ?Eat a healthy diet ? ?Eat a diet that includes plenty of vegetables, fruits, low-fat dairy products, and lean protein. ?Do not eat a lot of foods that are hig

## 2022-03-24 NOTE — Patient Instructions (Addendum)
Health Maintenance, Female ?Adopting a healthy lifestyle and getting preventive care are important in promoting health and wellness. Ask your health care provider about: ?The right schedule for you to have regular tests and exams. ?Things you can do on your own to prevent diseases and keep yourself healthy. ?What should I know about diet, weight, and exercise? ?Eat a healthy diet ? ?Eat a diet that includes plenty of vegetables, fruits, low-fat dairy products, and lean protein. ?Do not eat a lot of foods that are high in solid fats, added sugars, or sodium. ?Maintain a healthy weight ?Body mass index (BMI) is used to identify weight problems. It estimates body fat based on height and weight. Your health care provider can help determine your BMI and help you achieve or maintain a healthy weight. ?Get regular exercise ?Get regular exercise. This is one of the most important things you can do for your health. Most adults should: ?Exercise for at least 150 minutes each week. The exercise should increase your heart rate and make you sweat (moderate-intensity exercise). ?Do strengthening exercises at least twice a week. This is in addition to the moderate-intensity exercise. ?Spend less time sitting. Even light physical activity can be beneficial. ?Watch cholesterol and blood lipids ?Have your blood tested for lipids and cholesterol at 30 years of age, then have this test every 5 years. ?Have your cholesterol levels checked more often if: ?Your lipid or cholesterol levels are high. ?You are older than 30 years of age. ?You are at high risk for heart disease. ?What should I know about cancer screening? ?Depending on your health history and family history, you may need to have cancer screening at various ages. This may include screening for: ?Breast cancer. ?Cervical cancer. ?Colorectal cancer. ?Skin cancer. ?Lung cancer. ?What should I know about heart disease, diabetes, and high blood pressure? ?Blood pressure and heart  disease ?High blood pressure causes heart disease and increases the risk of stroke. This is more likely to develop in people who have high blood pressure readings or are overweight. ?Have your blood pressure checked: ?Every 3-5 years if you are 19-36 years of age. ?Every year if you are 48 years old or older. ?Diabetes ?Have regular diabetes screenings. This checks your fasting blood sugar level. Have the screening done: ?Once every three years after age 18 if you are at a normal weight and have a low risk for diabetes. ?More often and at a younger age if you are overweight or have a high risk for diabetes. ?What should I know about preventing infection? ?Hepatitis B ?If you have a higher risk for hepatitis B, you should be screened for this virus. Talk with your health care provider to find out if you are at risk for hepatitis B infection. ?Hepatitis C ?Testing is recommended for: ?Everyone born from [birth year] through 1964. ?Anyone with known risk factors for hepatitis C. ?Sexually transmitted infections (STIs) ?Get screened for STIs, including gonorrhea and chlamydia, if: ?You are sexually active and are younger than 30 years of age. ?You are older than 30 years of age and your health care provider tells you that you are at risk for this type of infection. ?Your sexual activity has changed since you were last screened, and you are at increased risk for chlamydia or gonorrhea. Ask your health care provider if you are at risk. ?Ask your health care provider about whether you are at high risk for HIV. Your health care provider may recommend a prescription medicine to help prevent HIV  infection. If you choose to take medicine to prevent HIV, you should first get tested for HIV. You should then be tested every 3 months for as long as you are taking the medicine. ?Pregnancy ?If you are about to stop having your period (premenopausal) and you may become pregnant, seek counseling before you get pregnant. ?Take 400 to 800  micrograms (mcg) of folic acid every day if you become pregnant. ?Ask for birth control (contraception) if you want to prevent pregnancy. ?Osteoporosis and menopause ?Osteoporosis is a disease in which the bones lose minerals and strength with aging. This can result in bone fractures. If you are 93 years old or older, or if you are at risk for osteoporosis and fractures, ask your health care provider if you should: ?Be screened for bone loss. ?Take a calcium or vitamin D supplement to lower your risk of fractures. ?Be given hormone replacement therapy (HRT) to treat symptoms of menopause. ?Follow these instructions at home: ?Alcohol use ?Do not drink alcohol if: ?Your health care provider tells you not to drink. ?You are pregnant, may be pregnant, or are planning to become pregnant. ?If you drink alcohol: ?Limit how much you have to: ?0-1 drink a day. ?Know how much alcohol is in your drink. In the [place] one drink equals one 12 oz bottle of beer (355 mL), one 5 oz glass of wine (148 mL), or one 1? oz glass of hard liquor (44 mL). ?Lifestyle ?Do not use any products that contain nicotine or tobacco. These products include cigarettes, chewing tobacco, and vaping devices, such as e-cigarettes. If you need help quitting, ask your health care provider. ?Do not use street drugs. ?Do not share needles. ?Ask your health care provider for help if you need support or information about quitting drugs. ?General instructions ?Schedule regular health, dental, and eye exams. ?Stay current with your vaccines. ?Tell your health care provider if: ?You often feel depressed. ?You have ever been abused or do not feel safe at home. ?Summary ?Adopting a healthy lifestyle and getting preventive care are important in promoting health and wellness. ?Follow your health care provider's instructions about healthy diet, exercising, and getting tested or screened for diseases. ?Follow your health care provider's instructions on monitoring your  cholesterol and blood pressure. ?This information is not intended to replace advice given to you by your health care provider. Make sure you discuss any questions you have with your health care provider. ?Document Revised: 03/31/2021 Document Reviewed: 03/31/2021 ?Elsevier Patient Education ? [place]. ? ?Health Maintenance, Female ?Adopting a healthy lifestyle and getting preventive care are important in promoting health and wellness. Ask your health care provider about: ?The right schedule for you to have regular tests and exams. ?Things you can do on your own to prevent diseases and keep yourself healthy. ?What should I know about diet, weight, and exercise? ?Eat a healthy diet ? ?Eat a diet that includes plenty of vegetables, fruits, low-fat dairy products, and lean protein. ?Do not eat a lot of foods that are high in solid fats, added sugars, or sodium. ?Maintain a healthy weight ?Body mass index (BMI) is used to identify weight problems. It estimates body fat based on height and weight. Your health care provider can help determine your BMI and help you achieve or maintain a healthy weight. ?Get regular exercise ?Get regular exercise. This is one of the most important things you can do for your health. Most adults should: ?Exercise for at least 150 minutes each week. The  exercise should increase your heart rate and make you sweat (moderate-intensity exercise). ?Do strengthening exercises at least twice a week. This is in addition to the moderate-intensity exercise. ?Spend less time sitting. Even light physical activity can be beneficial. ?Watch cholesterol and blood lipids ?Have your blood tested for lipids and cholesterol at 30 years of age, then have this test every 5 years. ?Have your cholesterol levels checked more often if: ?Your lipid or cholesterol levels are high. ?You are older than 30 years of age. ?You are at high risk for heart disease. ?What should I know about cancer screening? ?Depending  on your health history and family history, you may need to have cancer screening at various ages. This may include screening for: ?Breast cancer. ?Cervical cancer. ?Colorectal cancer. ?Skin cancer. ?Lung can

## 2022-03-25 LAB — HEPATITIS C ANTIBODY
Hepatitis C Ab: NONREACTIVE
SIGNAL TO CUT-OFF: 0.08 (ref <1.00)

## 2022-03-25 LAB — HEMOGLOBIN A1C: Hgb A1c MFr Bld: 5.7 % (ref 4.6–6.5)

## 2022-03-27 ENCOUNTER — Telehealth: Payer: Self-pay | Admitting: Neurology

## 2022-03-27 NOTE — Telephone Encounter (Signed)
Patient called to check status of Nurtec PA. ? ?She took her last one yesterday. ?

## 2022-03-27 NOTE — Telephone Encounter (Signed)
Advised pt we haven't seen a request come through but I will send that to the PA team to get started. ? ?Please check back on Monday for the status. If we haven't gotten a response we can give you samples until it is approved.  ?

## 2022-03-30 ENCOUNTER — Other Ambulatory Visit (HOSPITAL_COMMUNITY): Payer: Self-pay

## 2022-03-30 ENCOUNTER — Telehealth (HOSPITAL_COMMUNITY): Payer: Self-pay

## 2022-03-30 NOTE — Telephone Encounter (Signed)
Patient Advocate Encounter ?  ?Received notification that prior authorization for Nurtec '75MG'$  dispersible tablets is required. ?  ?PA submitted on 03/30/2022 ?Key [id number] ?Status is pending ?   ? ? ? ?

## 2022-03-30 NOTE — Telephone Encounter (Signed)
Patient Advocate Encounter ? ?Prior Authorization for Nurtec '75MG'$  dispersible tablets has been approved.   ? ?PA# [id number] ?Effective dates: 03/30/2022 through 03/31/2023 ? ? ?

## 2022-04-23 ENCOUNTER — Encounter (HOSPITAL_BASED_OUTPATIENT_CLINIC_OR_DEPARTMENT_OTHER): Payer: Self-pay | Admitting: Nurse Practitioner

## 2022-04-23 ENCOUNTER — Ambulatory Visit (INDEPENDENT_AMBULATORY_CARE_PROVIDER_SITE_OTHER): Payer: Medicaid Other | Admitting: Nurse Practitioner

## 2022-04-23 VITALS — BP 131/101 | HR 77 | Ht 69.0 in | Wt 151.0 lb

## 2022-04-23 DIAGNOSIS — E8881 Metabolic syndrome: Secondary | ICD-10-CM | POA: Insufficient documentation

## 2022-04-23 DIAGNOSIS — R030 Elevated blood-pressure reading, without diagnosis of hypertension: Secondary | ICD-10-CM

## 2022-04-23 DIAGNOSIS — Z720 Tobacco use: Secondary | ICD-10-CM | POA: Insufficient documentation

## 2022-04-23 DIAGNOSIS — G43011 Migraine without aura, intractable, with status migrainosus: Secondary | ICD-10-CM | POA: Diagnosis not present

## 2022-04-23 DIAGNOSIS — Z0000 Encounter for general adult medical examination without abnormal findings: Secondary | ICD-10-CM | POA: Diagnosis not present

## 2022-04-23 DIAGNOSIS — R239 Unspecified skin changes: Secondary | ICD-10-CM | POA: Diagnosis not present

## 2022-04-23 DIAGNOSIS — F3132 Bipolar disorder, current episode depressed, moderate: Secondary | ICD-10-CM | POA: Diagnosis not present

## 2022-04-23 HISTORY — DX: Tobacco use: Z72.0

## 2022-04-23 MED ORDER — AJOVY 225 MG/1.5ML ~~LOC~~ SOAJ: SUBCUTANEOUS | 6 refills | Status: DC

## 2022-04-23 MED ORDER — BUPROPION HCL ER (SR) 150 MG PO TB12: 150.0000 mg | ORAL_TABLET | Freq: Two times a day (BID) | ORAL | 1 refills | Status: DC

## 2022-04-23 NOTE — Progress Notes (Signed)
Orma Render, DNP, AGNP-c [facility] [address]  [place], [place] [address] [phone]  New patient visit   Patient: Michele Avila   DOB: 1991/02/29   31 y.o. Female  MRN: [id number] Visit Date: 04/23/2022  Patient Care Team: Majesty Oehlert, Coralee Pesa, NP as PCP - General (Nurse Practitioner) Pieter Partridge, DO as Consulting Physician (Neurology)  Today's Vitals   04/23/22 1429  BP: (!) 131/101  Pulse: 77  SpO2: 99%  Weight: 151 lb (68.5 kg)  Height: '5\' 9"'$  (1.753 m)   Body mass index is 22.3 kg/m.   Today's healthcare provider: Orma Render, NP   Chief Complaint  Patient presents with  . New Patient (Initial Visit)    Patient is here to establish care Concerns regarding weight Concerns about mirgraines Concerns about bipolar    Subjective    Michele Avila is a 31 y.o. female who presents today as a new patient to establish care.    Patient endorses the following concerns presently: Family history of kidney cancer in grandmother which ended in renal failure Bone cancer history in fathers side of family  Weight - last few months drinking lots of water - no fast food - eating only chicken, vegetables, nuts, and fruits. - walking 2 miles a day    History reviewed and reveals the following: Past Medical History:  Diagnosis Date  . Anemia   . Asthma    hx chronic bronchitis/exercise induced asthma as pre teen only  . COVID 03/2021   no COVID vaccine, also had COVID in 2021  . Environmental allergies   . Infection    UTI  . Migraine   . Seasonal allergies   . Wrist fracture, right    Past Surgical History:  Procedure Laterality Date  . CESAREAN SECTION  2008  . CESAREAN SECTION N/A 06/15/2013   Procedure: CESAREAN SECTION;  Surgeon: Allyn Kenner, DO;  Location: [facility];  Service: Obstetrics;  Laterality: N/A;  . DILATION AND CURETTAGE OF UTERUS  08/2011  . FOOT SURGERY  2017  . INDUCED ABORTION      Family Status  Relation Name Status  . Father  Deceased  . Mother  Alive  . MGM  (Not Specified)  . PGM  (Not Specified)  . Brother  Alive  . Brother  Alive   Family History  Problem Relation Age of Onset  . Cancer Father        liver  . Diabetes Father   . Hypertension Father   . Hypertension Mother   . Hypertension Maternal Grandmother   . Kidney disease Maternal Grandmother   . Diabetes Paternal Grandmother   . Hypertension Paternal Grandmother   . Ulcers Brother   . Crohn's disease Brother    Social History   Socioeconomic History  . Marital status: Significant Other    Spouse name: Not on file  . Number of children: 1  . Years of education: Not on file  . Highest education level: Some college, no degree  Occupational History    Employer: Materials engineer: [employer]  Tobacco Use  . Smoking status: Every Day    Packs/day: 0.50    Types: Cigarettes  . Smokeless tobacco: Never  . Tobacco comments:    down to 5 cigarrettes/day  Vaping Use  . Vaping Use: Never used  Substance and Sexual Activity  . Alcohol use: Yes    Comment: occassionally   .  Drug use: Yes    Types: Marijuana  . Sexual activity: Yes    Birth control/protection: I.U.D.  Other Topics Concern  . Not on file  Social History Narrative   Patient is right-handed. She lives with her boyfriend and son in a 2 story house. She drinks one beverage with caffeine 2x week. She is acitive at work, loading and unloading trucks.   Social Determinants of Health   Financial Resource Strain: Not on file  Food Insecurity: Not on file  Transportation Needs: Not on file  Physical Activity: Not on file  Stress: Not on file  Social Connections: Not on file   Outpatient Medications Prior to Visit  Medication Sig  . albuterol (VENTOLIN HFA) 108 (90 Base) MCG/ACT inhaler Inhale 1-2 puffs into the lungs every 6 (six) hours as needed for wheezing or shortness of breath.  . cephALEXin (KEFLEX) 500 MG  capsule Take 1 capsule (500 mg total) by mouth 3 (three) times daily.  . nortriptyline (PAMELOR) 10 MG capsule Take 1 capsule (10 mg total) by mouth at bedtime.  . ondansetron (ZOFRAN-ODT) 4 MG disintegrating tablet Take 1 tablet (4 mg total) by mouth every 8 (eight) hours as needed for nausea or vomiting.  . Rimegepant Sulfate (NURTEC) 75 MG TBDP Take 75 mg by mouth daily as needed (Maximum 1 tablet in 24 hours).   No facility-administered medications prior to visit.   Allergies  Allergen Reactions  . Camphor-Menthol Swelling    SEVERE FACIAL SWELLING SEVERE FACIAL SWELLING  . [name] [Camphor-Menthol] Swelling    SEVERE FACIAL SWELLING  . Yellow Dyes (Non-Tartrazine)     migraines  . Latex Hives and Rash   Immunization History  Administered Date(s) Administered  . Pneumococcal Polysaccharide-23 06/17/2013  . Tdap 06/16/2013, 05/07/2019    Review of Systems All review of systems negative except what is listed in the HPI   Objective    BP (!) 131/101   Pulse 77   Ht '5\' 9"'$  (1.753 m)   Wt 151 lb (68.5 kg)   SpO2 99%   BMI 22.30 kg/m  Physical Exam  No results found for any visits on 04/23/22.  Assessment & Plan      Problem List Items Addressed This Visit   None    No follow-ups on file.      Dessie Delcarlo, Coralee Pesa, NP, DNP, AGNP-C [facility] [place]

## 2022-04-23 NOTE — Patient Instructions (Signed)
Thank you for choosing [place] [facility] for your Primary Care needs. I am excited for the opportunity to partner with you to meet your health care goals. It was a pleasure meeting you today!  Recommendations from today's visit: I want to get a few labs today and make sure everything is ok I have sent in the [employer]- we will see if we can get prior authorization for this We will plan to start wellbutrin and follow-up in 2 weeks to see how you are doing with mood, smoking, and weight Keep working on your diet and exercise- these are great habits Give yourself some grace- we don't gain weight overnight and we wont lose it overnight. You are making good choices and that is important.  We can plan to do the biopsy in 2 weeks when we see how you are feeling with wellbutrin  Information on diet, exercise, and health maintenance recommendations are listed below. This is information to help you be sure you are on track for optimal health and monitoring.   Please look over this and let us know if you have any questions or if you have completed any of the health maintenance outside of [place] so that we can be sure your records are up to date.  ___________________________________________________________ About Me: I am an Adult-Geriatric Nurse Practitioner with a background in caring for patients for more than 20 years with a strong intensive care background. I provide primary care and sports medicine services to patients age 53 and older within this office. My education had a strong focus on caring for the older adult population, which I am passionate about. I am also the director of the APP Fellowship with [facility].   My desire is to provide you with the best service through preventive medicine and supportive care. I consider you a part of the medical team and value your input. I work diligently to ensure that you are heard and your needs are met in a safe and effective  manner. I want you to feel comfortable with me as your provider and want you to know that your health concerns are important to me.  For your information, our office hours are: Monday, Tuesday, and Thursday 8:00 AM - 5:00 PM Wednesday and Friday 8:00 AM - 12:00 PM.   In my time away from the office I am teaching new APP's within the system and am unavailable, but my partner, Dr. Burnard Bunting is in the office for emergent needs.   If you have questions or concerns, please call our office at [phone] or send [name] a MyChart message and we will respond as quickly as possible.  ____________________________________________________________ MyChart:  For all urgent or time sensitive needs we ask that you please call the office to avoid delays. Our number is [phone]. MyChart is not constantly monitored and due to the large volume of messages a day, replies may take up to 72 business hours.  MyChart Policy: MyChart allows for you to see your visit notes, after visit summary, provider recommendations, lab and tests results, make an appointment, request refills, and contact your provider or the office for non-urgent questions or concerns. Providers are seeing patients during normal business hours and do not have built in time to review MyChart messages.  We ask that you allow a minimum of 3 business days for responses to [employer]. For this reason, please do not send urgent requests through [place]. Please call the office at [phone]. New and  ongoing conditions may require a visit. We have virtual and in person visit available for your convenience.  Complex MyChart concerns may require a visit. Your provider may request you schedule a virtual or in person visit to ensure we are providing the best care possible. MyChart messages sent after 11:00 AM on Friday will not be received by the provider until Monday morning.    Lab and Test Results: You will receive your lab and test results on MyChart  as soon as they are completed and results have been sent by the lab or testing facility. Due to this service, you will receive your results BEFORE your provider.  I review lab and tests results each morning prior to seeing patients. Some results require collaboration with other providers to ensure you are receiving the most appropriate care. For this reason, we ask that you please allow a minimum of 3-5 business days from the time the ALL results have been received for your provider to receive and review lab and test results and contact you about these.  Most lab and test result comments from the provider will be sent through [place]. Your provider may recommend changes to the plan of care, follow-up visits, repeat testing, ask questions, or request an office visit to discuss these results. You may reply directly to this message or call the office at [phone] to provide information for the provider or set up an appointment. In some instances, you will be called with test results and recommendations. Please let us know if this is preferred and we will make note of this in your chart to provide this for you.    If you have not heard a response to your lab or test results in 5 business days from all results returning to [place], please call the office to let us know. We ask that you please avoid calling prior to this time unless there is an emergent concern. Due to high call volumes, this can delay the resulting process.  After Hours: For all non-emergency after hours needs, please call the office at [phone] and select the option to reach the on-call provider service. On-call services are shared between multiple [facility] offices and therefore it will not be possible to speak directly with your provider. On-call providers may provide medical advice and recommendations, but are unable to provide refills for maintenance medications.  For all emergency or urgent medical needs after normal business  hours, we recommend that you seek care at the closest Urgent Care or Emergency Department to ensure appropriate treatment in a timely manner.  [facility] [place] has a 24 hour emergency room located on the ground floor for your convenience.   Urgent Concerns During the Business Day Providers are seeing patients from 8AM to [place] with a busy schedule and are most often not able to respond to non-urgent calls until the end of the day or the next business day. If you should have URGENT concerns during the day, please call and speak to the nurse or schedule a same day appointment so that we can address your concern without delay.   Thank you, again, for choosing me as your health care partner. I appreciate your trust and look forward to learning more about you.   Worthy Keeler, DNP, AGNP-c ___________________________________________________________  Health Maintenance Recommendations Screening Testing Mammogram Every 1 -2 years based on history and risk factors Starting at age 78 Pap Smear Ages 21-39 every 3 years Ages 71-65 every 5 years with HPV testing  More frequent testing may be required based on results and history Colon Cancer Screening Every 1-10 years based on test performed, risk factors, and history Starting at age 52 Bone Density Screening Every 2-10 years based on history Starting at age 66 for women Recommendations for men differ based on medication usage, history, and risk factors AAA Screening One time ultrasound Men 89-44 years old who have every smoked Lung Cancer Screening Low Dose Lung CT every 12 months Age 73-80 years with a 30 pack-year smoking history who still smoke or who have quit within the last 15 years  Screening Labs Routine  Labs: Complete Blood Count (CBC), Complete Metabolic Panel (CMP), Cholesterol (Lipid Panel) Every 6-12 months based on history and medications May be recommended more frequently based on current conditions or  previous results Hemoglobin A1c Lab Every 3-12 months based on history and previous results Starting at age 58 or earlier with diagnosis of diabetes, high cholesterol, BMI >26, and/or risk factors Frequent monitoring for patients with diabetes to ensure blood sugar control Thyroid Panel (TSH w/ T3 & T4) Every 6 months based on history, symptoms, and risk factors May be repeated more often if on medication HIV One time testing for all patients 55 and older May be repeated more frequently for patients with increased risk factors or exposure Hepatitis C One time testing for all patients 42 and older May be repeated more frequently for patients with increased risk factors or exposure Gonorrhea, Chlamydia Every 12 months for all sexually active persons 13-24 years Additional monitoring may be recommended for those who are considered high risk or who have symptoms PSA Men 71-31 years old with risk factors Additional screening may be recommended from age 53-69 based on risk factors, symptoms, and history  Vaccine Recommendations Tetanus Booster All adults every 10 years Flu Vaccine All patients 6 months and older every year COVID Vaccine All patients 12 years and older Initial dosing with booster May recommend additional booster based on age and health history HPV Vaccine 2 doses all patients age 25-26 Dosing may be considered for patients over 26 Shingles Vaccine (Shingrix) 2 doses all adults 49 years and older Pneumonia (Pneumovax 23) All adults 29 years and older May recommend earlier dosing based on health history Pneumonia (Prevnar 74) All adults 33 years and older Dosed 1 year after Pneumovax 23  Additional Screening, Testing, and Vaccinations may be recommended on an individualized basis based on family history, health history, risk factors, and/or exposure.  __________________________________________________________  Diet Recommendations for All Patients  I recommend  that all patients maintain a diet low in saturated fats, carbohydrates, and cholesterol. While this can be challenging at first, it is not impossible and small changes can make big differences.  Things to try: Decreasing the amount of soda, sweet tea, and/or juice to one or less per day and replace with water While water is always the first choice, if you do not like water you may consider adding a water additive without sugar to improve the taste other sugar free drinks Replace potatoes with a brightly colored vegetable at dinner Use healthy oils, such as canola oil or olive oil, instead of butter or hard margarine Limit your bread intake to two pieces or less a day Replace regular pasta with low carb pasta options Bake, broil, or grill foods instead of frying Monitor portion sizes  Eat smaller, more frequent meals throughout the day instead of large meals  An important thing to remember is, if you love foods  that are not great for your health, you don't have to give them up completely. Instead, allow these foods to be a reward when you have done well. Allowing yourself to still have special treats every once in a while is a nice way to tell yourself thank you for working hard to keep yourself healthy.   Also remember that every day is a new day. If you have a bad day and "fall off the wagon", you can still climb right back up and keep moving along on your journey!  We have resources available to help you!  Some websites that may be helpful include: www.http://carter.biz/  Www.VeryWellFit.com _____________________________________________________________  Activity Recommendations for All Patients  I recommend that all adults get at least 20 minutes of moderate physical activity that elevates your heart rate at least 5 days out of the week.  Some examples include: Walking or jogging at a pace that allows you to carry on a conversation Cycling (stationary bike or outdoors) Water  aerobics Yoga Weight lifting Dancing If physical limitations prevent you from putting stress on your joints, exercise in a pool or seated in a chair are excellent options.  Do determine your MAXIMUM heart rate for activity: YOUR AGE - 220 = MAX HeartRate   Remember! Do not push yourself too hard.  Start slowly and build up your pace, speed, weight, time in exercise, etc.  Allow your body to rest between exercise and get good sleep. You will need more water than normal when you are exerting yourself. Do not wait until you are thirsty to drink. Drink with a purpose of getting in at least 8, 8 ounce glasses of water a day plus more depending on how much you exercise and sweat.    If you begin to develop dizziness, chest pain, abdominal pain, jaw pain, shortness of breath, headache, vision changes, lightheadedness, or other concerning symptoms, stop the activity and allow your body to rest. If your symptoms are severe, seek emergency evaluation immediately. If your symptoms are concerning, but not severe, please let us know so that we can recommend further evaluation.

## 2022-04-24 DIAGNOSIS — R030 Elevated blood-pressure reading, without diagnosis of hypertension: Secondary | ICD-10-CM | POA: Insufficient documentation

## 2022-04-24 DIAGNOSIS — Z0000 Encounter for general adult medical examination without abnormal findings: Secondary | ICD-10-CM | POA: Insufficient documentation

## 2022-04-24 DIAGNOSIS — R239 Unspecified skin changes: Secondary | ICD-10-CM

## 2022-04-24 HISTORY — DX: Encounter for general adult medical examination without abnormal findings: Z00.00

## 2022-04-24 HISTORY — DX: Unspecified skin changes: R23.9

## 2022-04-24 LAB — TSH: TSH: 2.3 u[IU]/mL (ref 0.450–4.500)

## 2022-04-24 LAB — VITAMIN D 25 HYDROXY (VIT D DEFICIENCY, FRACTURES): Vit D, 25-Hydroxy: 18.4 ng/mL — ABNORMAL LOW (ref 30.0–100.0)

## 2022-04-24 LAB — T4, FREE: Free T4: 1.26 ng/dL (ref 0.82–1.77)

## 2022-04-24 NOTE — Assessment & Plan Note (Signed)
Chronic.  She has recently made significant changes to her diet and exercise with no significant improvement of her weight.  At this time etiology is unclear.  I did review labs from recent office visit.  We will add thyroid labs today to see if this could be a contributing issue.  Discussed the option of starting bupropion with patient to help manage her bipolar disorder and help aid in quitting smoking as well as help with weight loss efforts.  She is in agreement to this today.  We will begin with 100 mg twice a day and plan to follow-up in the next couple of weeks to see how she is doing. Patient praised for her efforts for diet and exercise changes and encouraged her to continue with these.  We will make changes to plan of care based on lab findings.

## 2022-04-24 NOTE — Assessment & Plan Note (Signed)
Patient's blood pressure is elevated in the office today.  She has no history of hypertension.  I do suspect that the increased anxiety associated with the new patient visit, smoking right before she came in ,and the experience that she recently had is contributing to the elevation in her blood pressure today. Encouraged the patient to continue to monitor her diet and exercise.  We will recheck her blood pressure at her next visit.  Encourage patient to intermittently check her blood pressure at home and write down the findings so that we can monitor how it is outside of the office.

## 2022-04-24 NOTE — Assessment & Plan Note (Signed)
She has cut down to approximately 5 cigarettes a day and is working to completely stop.  Discussed the option of bupropion as an aid to help with her mood, weight, and smoking cessation.  She is interested in this today.  We will plan to begin bupropion 100 mg twice a day.  Encouraged the patient to set a quit date and avoid purchasing cigarettes to last beyond that quit date to help with accountability.  We will plan to follow-up in 2 weeks or sooner if needed.

## 2022-04-24 NOTE — Assessment & Plan Note (Signed)
New patient to establish care today.  Patient has several issues that she would like to discuss. Discussed with patient that we will likely need to break this information up but we will work to address the issues over time. She did recently have an establish care appointment with a different provider who opted not to take her on as a patient.  I was able to review those labs today.  We will also obtain thyroid and vitamin D labs for her today as I do feel these are pertinent to her care. We will plan to follow-up in the next few weeks to continue to address her concerns further.

## 2022-04-24 NOTE — Assessment & Plan Note (Signed)
Chronic.  No current medications.  No alarm symptoms at this time. Discussed the option of beginning treatment today with bupropion which could help with several of the patient's concerns.  She is very interested in this today.  We will plan to start with bupropion 100 mg twice a day and monitor how she is doing in 2 to 3 weeks.  At that time increased dose can be provided if needed. Recommend psychiatry and counseling services to help with management and long-term goals.

## 2022-04-24 NOTE — Assessment & Plan Note (Signed)
Chronic.  Currently managed with neurology.  Discussed option of trial of long-acting preventative treatment such as Ajovy.  She is interested in looking into this today.  We will send prescription today.  Discussed with patient that this will likely need prior authorization.  Encourage patient to discuss this further with neurology as they are best equipped to manage this condition.  Continue Nurtec for abortive treatment.

## 2022-04-24 NOTE — Assessment & Plan Note (Signed)
Pigmented nodularities on the lower extremities of unknown etiology.  Discussed with patient that this will likely need a biopsy for complete evaluation.  She will plan to return in the next 2 weeks and we will biopsy these.  No alarm symptoms present at this time.  Labs reviewed from recent office visit.

## 2022-04-27 ENCOUNTER — Ambulatory Visit (HOSPITAL_BASED_OUTPATIENT_CLINIC_OR_DEPARTMENT_OTHER): Payer: Medicaid Other | Admitting: Nurse Practitioner

## 2022-04-27 ENCOUNTER — Telehealth (HOSPITAL_BASED_OUTPATIENT_CLINIC_OR_DEPARTMENT_OTHER): Payer: Self-pay

## 2022-04-27 NOTE — Telephone Encounter (Signed)
Pt states wellbutrin was not submitted to the pharmacy and inquired about prior auth. Ajovy. Please advise.  Thanks

## 2022-04-27 NOTE — Telephone Encounter (Signed)
Patient states Wellbutrin was not submitted to pharmacy and inquired about prior auth for Ajovy. Please advise

## 2022-04-28 NOTE — Telephone Encounter (Signed)
She stated she has picked up her medication.  Thank you so much

## 2022-04-28 NOTE — Telephone Encounter (Signed)
Wellbutrin shows is was sent 04/23/2022 to [facility] on [address]. Can you confirm this is the correct pharmacy? If so- OK to send again or change pharmacy and resend same script.    I do not know about the ajovy PA. I have not heard anything about this yet.

## 2022-04-29 ENCOUNTER — Other Ambulatory Visit (HOSPITAL_BASED_OUTPATIENT_CLINIC_OR_DEPARTMENT_OTHER): Payer: Self-pay | Admitting: Nurse Practitioner

## 2022-04-29 DIAGNOSIS — E559 Vitamin D deficiency, unspecified: Secondary | ICD-10-CM

## 2022-04-29 MED ORDER — VITAMIN D (ERGOCALCIFEROL) 1.25 MG (50000 UNIT) PO CAPS: 50000.0000 [IU] | ORAL_CAPSULE | ORAL | 0 refills | Status: DC

## 2022-05-06 ENCOUNTER — Encounter: Payer: Self-pay | Admitting: Neurology

## 2022-05-14 ENCOUNTER — Ambulatory Visit (INDEPENDENT_AMBULATORY_CARE_PROVIDER_SITE_OTHER): Payer: Medicaid Other | Admitting: Nurse Practitioner

## 2022-05-14 ENCOUNTER — Other Ambulatory Visit (HOSPITAL_COMMUNITY)
Admission: RE | Admit: 2022-05-14 | Discharge: 2022-05-14 | Disposition: A | Discharge status: Home / Self Care | Payer: Medicaid Other | Source: Ambulatory Visit | Attending: Nurse Practitioner | Admitting: Nurse Practitioner

## 2022-05-14 ENCOUNTER — Encounter (HOSPITAL_BASED_OUTPATIENT_CLINIC_OR_DEPARTMENT_OTHER): Payer: Self-pay | Admitting: Nurse Practitioner

## 2022-05-14 ENCOUNTER — Other Ambulatory Visit (HOSPITAL_BASED_OUTPATIENT_CLINIC_OR_DEPARTMENT_OTHER): Payer: Self-pay

## 2022-05-14 VITALS — BP 122/82 | HR 85 | Ht 69.0 in | Wt 154.1 lb

## 2022-05-14 DIAGNOSIS — R2240 Localized swelling, mass and lump, unspecified lower limb: Secondary | ICD-10-CM | POA: Insufficient documentation

## 2022-05-14 HISTORY — DX: Localized swelling, mass and lump, unspecified lower limb: R22.40

## 2022-05-14 NOTE — Assessment & Plan Note (Signed)
Pigmented nodularity noted on the lower extremities bilaterally with varying sizes. Decision to biopsy one nodularity that has grown rapidly within the last three months located on left posterior calf approximately 4 inches from the popliteal fossa.  Verbal consent obtain. Area cleansed with chlorhexidine swab x2 in circular fashion from nodule out approximately 8" in diameter.   1% lidocaine with epinephrine and sodium bicarb in 3:1 ratio utilized to anesthetize the area.  In sterile fashion, 10m punch biopsy utilized to remove 468mdiameter area approximately 33m71meep.  2 sutures placed to close biopsy area and covered with steri strips and sterile gauze. Pressure bandage made.  Hemostasis obtained. Blood loss minimal. Patient tolerated procedure well.  Aftercare instructions provided. Nodule will be send for biopsy.  Will notify patient of biopsy results and make changes to plan of care based on findings.

## 2022-05-14 NOTE — Progress Notes (Signed)
Worthy Keeler, DNP, AGNP-c [place] [address], [place] [address] [phone] Office [phone] Fax  ESTABLISHED PATIENT- Chronic Health and/or Follow-Up Visit  Blood pressure 122/82, pulse 85, height '5\' 9"'$  (1.753 m), weight 154 lb 1.6 oz (69.9 kg), SpO2 97 %.  Procedure (She is feeling better, she feels the medication is working. Her husband has made as comments in the difference. Patient presents for biopsy on moles left leg. )   HPI  Michele Avila  is a 31 y.o. year old female presenting today for evaluation and management of the following: Biopsy of skin nodule First noticed nodule on leg in high school at left lateral thigh. Area grew slowly from pigmented macule to pigmented nodule. No drainage, pruritis, texture changes to skin.  Following the birth of her son 9 years ago she noted additional nodularities show up She reports she has noticed an increase in these areas in recent months.  She does have a family history of multiple cancers and would like to ensure that this is not concerning.  Mood She endorses increased positive effects on her mood since starting wellbutrin She does endorses feeling her heart beat for a few seconds intermittently since starting, but no CP, ShOB, dizziness and these episodes resolve spontaneously She tells me that she is feeling better since starting the medication and is able to get up in the mornings with more energy.   ROS All ROS negative with exception of what is listed in HPI  PHYSICAL EXAM Physical Exam Vitals and nursing note reviewed.  Constitutional:      Appearance: Normal appearance.  HENT:     Head: Normocephalic.  Cardiovascular:     Rate and Rhythm: Normal rate and regular rhythm.  Pulmonary:     Effort: Pulmonary effort is normal.  Musculoskeletal:        General: No swelling or tenderness.     Right lower leg: No edema.     Left lower leg: No edema.   Skin:    General: Skin is warm and dry.     Capillary Refill: Capillary refill takes less than 2 seconds.     Findings: Lesion present. No rash.       Neurological:     General: No focal deficit present.     Mental Status: She is alert and oriented to person, place, and time.     ASSESSMENT & PLAN Problem List Items Addressed This Visit     Nodule of skin of lower extremity - Primary    Pigmented nodularity noted on the lower extremities bilaterally with varying sizes. Decision to biopsy one nodularity that has grown rapidly within the last three months located on left posterior calf approximately 4 inches from the popliteal fossa.  Verbal consent obtain. Area cleansed with chlorhexidine swab x2 in circular fashion from nodule out approximately 8" in diameter.   1% lidocaine with epinephrine and sodium bicarb in 3:1 ratio utilized to anesthetize the area.  In sterile fashion, 36m punch biopsy utilized to remove 460mdiameter area approximately 46m86meep.  2 sutures placed to close biopsy area and covered with steri strips and sterile gauze. Pressure bandage made.  Hemostasis obtained. Blood loss minimal. Patient tolerated procedure well.  Aftercare instructions provided. Nodule will be send for biopsy.  Will notify patient of biopsy results and make changes to plan of care based on findings.         FOLLOW-UP Return in about 1 week (  around 05/21/2022) for suture removal.  Worthy Keeler, DNP, AGNP-c 05/14/2022  8:45 AM

## 2022-05-14 NOTE — Patient Instructions (Addendum)
Monitor the area for any signs of redness, drainage, or warmth. Let me know if you notice any of these symptoms.  More information is on the back of this form.   Medication Instructions:  Your physician recommends that you continue on your current medications as directed. Please refer to the Current Medication list given to you today. --If you need a refill on any your medications before your next appointment, please call your pharmacy first. If no refills are authorized on file call the office.--     Follow-Up: Your next appointment:   Your physician recommends that you schedule a follow-up appointment in: 7 days with Michele Avila Early to remove stitches.   You will receive a text message or e-mail with a link to a survey about your care and experience with [name] today! We would greatly appreciate your feedback!   Thanks for letting us be apart of your health journey!!  Primary Care and Sports Medicine     We encourage you to activate your patient portal called "MyChart".  Sign up information is provided on this After Visit Summary.  MyChart is used to connect with patients for Virtual Visits (Telemedicine).  Patients are able to view lab/test results, encounter notes, upcoming appointments, etc.  Non-urgent messages can be sent to your provider as well. To learn more about what you can do with MyChart, please visit --  [email or web address].

## 2022-05-20 LAB — SURGICAL PATHOLOGY

## 2022-05-21 ENCOUNTER — Encounter (HOSPITAL_BASED_OUTPATIENT_CLINIC_OR_DEPARTMENT_OTHER): Payer: Self-pay

## 2022-05-21 ENCOUNTER — Telehealth (HOSPITAL_BASED_OUTPATIENT_CLINIC_OR_DEPARTMENT_OTHER): Payer: Self-pay

## 2022-05-21 ENCOUNTER — Ambulatory Visit (HOSPITAL_BASED_OUTPATIENT_CLINIC_OR_DEPARTMENT_OTHER): Payer: Medicaid Other | Admitting: Nurse Practitioner

## 2022-05-21 ENCOUNTER — Encounter (HOSPITAL_BASED_OUTPATIENT_CLINIC_OR_DEPARTMENT_OTHER): Payer: Self-pay | Admitting: Nurse Practitioner

## 2022-05-21 NOTE — Telephone Encounter (Signed)
After removing her sutures today. She told me she had seen her pathology report for the biopsy on mychart. She said that it recommends removal of the there moles. She would like you to remove them, she has 8 total all on her legs. I wasn't sure your were comfortable with removing all them and I told her I would ask and send her a mychart message so we could schedule accordingly. Let me know your thoughts.

## 2022-06-09 ENCOUNTER — Encounter: Payer: Self-pay | Admitting: Neurology

## 2022-06-09 ENCOUNTER — Encounter (HOSPITAL_BASED_OUTPATIENT_CLINIC_OR_DEPARTMENT_OTHER): Payer: Self-pay | Admitting: Nurse Practitioner

## 2022-06-11 ENCOUNTER — Ambulatory Visit (INDEPENDENT_AMBULATORY_CARE_PROVIDER_SITE_OTHER): Payer: Medicaid Other | Admitting: Nurse Practitioner

## 2022-06-11 ENCOUNTER — Encounter (HOSPITAL_BASED_OUTPATIENT_CLINIC_OR_DEPARTMENT_OTHER): Payer: Self-pay | Admitting: Nurse Practitioner

## 2022-06-11 VITALS — BP 128/101 | HR 86 | Ht 69.0 in | Wt 252.0 lb

## 2022-06-11 DIAGNOSIS — R030 Elevated blood-pressure reading, without diagnosis of hypertension: Secondary | ICD-10-CM | POA: Diagnosis not present

## 2022-06-11 DIAGNOSIS — M545 Low back pain, unspecified: Secondary | ICD-10-CM

## 2022-06-11 LAB — POCT URINALYSIS DIP (CLINITEK)
Bilirubin, UA: NEGATIVE
Glucose, UA: NEGATIVE mg/dL
Ketones, POC UA: NEGATIVE mg/dL
Leukocytes, UA: NEGATIVE
Nitrite, UA: NEGATIVE
POC PROTEIN,UA: NEGATIVE
Spec Grav, UA: 1.025 (ref 1.010–1.025)
Urobilinogen, UA: 0.2 E.U./dL
pH, UA: 7 (ref 5.0–8.0)

## 2022-06-11 MED ORDER — PREDNISONE 20 MG PO TABS: ORAL_TABLET | ORAL | 0 refills | Status: DC

## 2022-06-11 MED ORDER — KETOROLAC TROMETHAMINE 60 MG/2ML IM SOLN
60.0000 mg | Freq: Once | INTRAMUSCULAR | Status: AC
  Administered 2022-06-11: 60 mg via INTRAMUSCULAR

## 2022-06-11 MED ORDER — MELOXICAM 15 MG PO TABS: 15.0000 mg | ORAL_TABLET | Freq: Every day | ORAL | 0 refills | Status: DC

## 2022-06-11 MED ORDER — DEXAMETHASONE SODIUM PHOSPHATE 10 MG/ML IJ SOLN
10.0000 mg | Freq: Once | INTRAMUSCULAR | Status: AC
  Administered 2022-06-11: 10 mg via INTRAMUSCULAR

## 2022-06-11 MED ORDER — CYCLOBENZAPRINE HCL 10 MG PO TABS: 10.0000 mg | ORAL_TABLET | Freq: Three times a day (TID) | ORAL | 0 refills | Status: DC | PRN

## 2022-06-11 NOTE — Assessment & Plan Note (Signed)
Worsening left-sided low back pain since Sunday.  There are no alarm symptoms present today that would warrant immediate need for imaging.  Symptoms and presentation consistent with inflammation of the SI joint on the left side.  Recommend anti-inflammatories, ice, rest, and gentle stretching exercises.  Exercises demonstrated for patient in the office today and she expressed understanding.  We will plan to give Toradol and dexamethasone injection today and send patient home with prescription for meloxicam and prednisone taper to start tomorrow.  We will also send in Flexeril as there does appear to be spasming of the muscle in the area most likely due to alteration in posture to help alleviate pain.  Discussed with patient the importance of not driving while taking Flexeril and getting plenty of rest.  She will follow-up if her symptoms worsen or fail to improve with current treatment.

## 2022-06-11 NOTE — Assessment & Plan Note (Signed)
Blood pressure elevated in the office today however patient is clearly in distress and pain which is likely driving this up.  Improvement noted prior to discharge.  We will continue to monitor

## 2022-06-11 NOTE — Patient Instructions (Addendum)
It was a pleasure seeing you today. I hope your time spent with [name] was pleasant and helpful. Please let us know if there is anything we can do to improve the service you receive.   Today we discussed concerns with:  SI joint pain-   You have received a shot of dexamethasone and toradol today in the office.  Start prednisone and meloxicam tomorrow morning. You can start the flexeril today.  If this does not get better, let me know and we will need to send you to ortho to see if they need to do a joint injection.    Important Office Information Lab Results If labs were ordered, please note that you will see results through [place] as soon as they come available from [place].  It takes up to 5 business days for the results to be routed to me and for me to review them once all of the lab results have come through from [facility]. I will make recommendations based on your results and send these through [place] or someone from the office will call you to discuss. If your labs are abnormal, we may contact you to schedule a visit to discuss the results and make recommendations.  If you have not heard from [name] within 5 business days or you have waited longer than a week and your lab results have not come through on [place], please feel free to call the office or send a message through [place] to follow-up on these labs.   Referrals If referrals were placed today, the office where the referral was sent will contact you either by phone or through [place] to set up scheduling. Please note that it can take up to a week for the referral office to contact you. If you do not hear from them in a week, please contact the referral office directly to inquire about scheduling.   Condition Treated If your condition worsens or you begin to have new symptoms, please schedule a follow-up appointment for further evaluation. If you are not sure if an appointment is needed, you may call the office to leave a message for the  nurse and someone will contact you with recommendations.  If you have an urgent or life threatening emergency, please do not call the office, but seek emergency evaluation by calling 911 or going to the nearest emergency room for evaluation.   MyChart and Phone Calls Please do not use MyChart for urgent messages. It may take up to 3 business days for MyChart messages to be read by staff and if they are unable to handle the request, an additional 3 business days for them to be routed to me and for my response.  Messages sent to the provider through [place] do not come directly to the provider, please allow time for these messages to be routed and for me to respond.  We get a large volume of MyChart messages daily and these are responded to in the order received.   For urgent messages, please call the office at [phone] and speak with the front office staff or leave a message on the line of my assistant for guidance.  We are seeing patients from the hours of 8:00 am through 5:00 pm and calls directly to the nurse may not be answered immediately due to seeing patients, but your call will be returned as soon as possible.  Phone  messages received after 4:00 PM Monday through Thursday may not be returned until the following business day. Phone messages  received after 11:00 AM on Friday may not be returned until Monday.   After Hours We share on call hours with providers from other offices. If you have an urgent need after hours that cannot wait until the next business day, please contact the on call provider by calling the office number. A nurse will speak with you and contact the provider if needed for recommendations.  If you have an urgent or life threatening emergency after hours, please do not call the on call provider, but seek emergency evaluation by calling 911 or going to the nearest emergency room for evaluation.   Paperwork All paperwork requires a minimum of 5 days to complete and return  to you or the designated personnel. Please keep this in mind when bringing in forms or sending requests for paperwork completion to the office.

## 2022-06-11 NOTE — Progress Notes (Signed)
Orma Render, DNP, AGNP-c Primary Care & Sports Medicine [address]  [place], [place] [address] [phone]  Subjective:   Michele Avila is a 31 y.o. female presents to day for back pain with concerns for kidney stones.  Michele Avila reports that she first noted low back pain on the left side on Sunday night with no known injury and sudden onset.  She reports that the pain continued through Monday and became severe on Tuesday.  Since then the pain has continued which is why she decided to make an appointment today.  She denies any radiation at this time.  She is not having any saddle symptoms.  She does endorse increased pain with sitting, mild relief with standing, and increased relief with standing position and bent over.  She has been taking Tylenol and ibuprofen for the pain.  She did have a prescription for an old muscle relaxer which she will try and this was not helpful (tizanidine).    PMH, Medications, and Allergies reviewed and updated in chart.   ROS negative except for what is listed in HPI. Objective:  BP (!) 128/101   Pulse 86   Ht '5\' 9"'$  (1.753 m)   Wt 252 lb (114.3 kg)   SpO2 100%   BMI 37.21 kg/m  Physical Exam Constitutional:      General: She is in acute distress.  Eyes:     Pupils: Pupils are equal, round, and reactive to light.  Cardiovascular:     Rate and Rhythm: Normal rate.     Pulses: Normal pulses.  Pulmonary:     Effort: Pulmonary effort is normal.  Abdominal:     General: There is no distension.     Tenderness: There is no abdominal tenderness. There is no right CVA tenderness, left CVA tenderness or guarding.  Musculoskeletal:     Cervical back: Normal.     Thoracic back: Normal.     Lumbar back: Bony tenderness present. Decreased range of motion. Positive left straight leg raise test.       Back:  Skin:    General: Skin is warm and dry.     Capillary Refill: Capillary refill takes less than 2 seconds.   Neurological:     General: No focal deficit present.     Mental Status: She is alert.     Motor: Weakness present.     Gait: Gait abnormal.           Assessment & Plan:   Problem List Items Addressed This Visit     Elevated blood pressure reading    Blood pressure elevated in the office today however patient is clearly in distress and pain which is likely driving this up.  Improvement noted prior to discharge.  We will continue to monitor      Acute left-sided low back pain without sciatica - Primary    Worsening left-sided low back pain since Sunday.  There are no alarm symptoms present today that would warrant immediate need for imaging.  Symptoms and presentation consistent with inflammation of the SI joint on the left side.  Recommend anti-inflammatories, ice, rest, and gentle stretching exercises.  Exercises demonstrated for patient in the office today and she expressed understanding.  We will plan to give Toradol and dexamethasone injection today and send patient home with prescription for meloxicam and prednisone taper to start tomorrow.  We will also send in Flexeril as there does appear to be spasming of the muscle in  the area most likely due to alteration in posture to help alleviate pain.  Discussed with patient the importance of not driving while taking Flexeril and getting plenty of rest.  She will follow-up if her symptoms worsen or fail to improve with current treatment.      Relevant Medications   predniSONE (DELTASONE) 20 MG tablet   meloxicam (MOBIC) 15 MG tablet   cyclobenzaprine (FLEXERIL) 10 MG tablet   Other Relevant Orders   POCT URINALYSIS DIP (CLINITEK) (Completed)     Orma Render, DNP, AGNP-c 06/11/2022  8:12 PM

## 2022-06-25 ENCOUNTER — Telehealth: Payer: Self-pay | Admitting: Neurology

## 2022-06-25 ENCOUNTER — Ambulatory Visit (INDEPENDENT_AMBULATORY_CARE_PROVIDER_SITE_OTHER): Payer: Medicaid Other

## 2022-06-25 DIAGNOSIS — G43009 Migraine without aura, not intractable, without status migrainosus: Secondary | ICD-10-CM

## 2022-06-25 MED ORDER — DIPHENHYDRAMINE HCL 50 MG/ML IJ SOLN
50.0000 mg | Freq: Once | INTRAMUSCULAR | Status: AC
Start: 2022-06-25 — End: 2022-06-25
  Administered 2022-06-25: 25 mg via INTRAMUSCULAR

## 2022-06-25 MED ORDER — METOCLOPRAMIDE HCL 5 MG/ML IJ SOLN
10.0000 mg | Freq: Once | INTRAMUSCULAR | Status: AC
  Administered 2022-06-25: 10 mg via INTRAMUSCULAR

## 2022-06-25 MED ORDER — KETOROLAC TROMETHAMINE 60 MG/2ML IM SOLN
60.0000 mg | Freq: Once | INTRAMUSCULAR | Status: AC
  Administered 2022-06-25: 60 mg via INTRAMUSCULAR

## 2022-06-25 NOTE — Telephone Encounter (Signed)
Ok for migraine cocktail. Needs driver. thanks

## 2022-06-25 NOTE — Progress Notes (Signed)
Patient presents for a Headache cocktail, Patient given injection and tolerated well.  Patient vomiting at the time of visit. Per patient the Zofran 4 mg did not help at all.  Per Dr.Patel Patient may increase or double up on her Zofran to 8 mg and see if that will help with the nausea and vomiting.

## 2022-06-25 NOTE — Telephone Encounter (Signed)
Pt woke up with a migraine this morning. She would like to see if she can get a headache cocktail. She cannot keep any of her medication down.

## 2022-06-27 ENCOUNTER — Other Ambulatory Visit (HOSPITAL_BASED_OUTPATIENT_CLINIC_OR_DEPARTMENT_OTHER): Payer: Self-pay | Admitting: Nurse Practitioner

## 2022-06-27 DIAGNOSIS — Z720 Tobacco use: Secondary | ICD-10-CM

## 2022-06-27 DIAGNOSIS — F3132 Bipolar disorder, current episode depressed, moderate: Secondary | ICD-10-CM

## 2022-06-28 ENCOUNTER — Encounter: Payer: Self-pay | Admitting: Neurology

## 2022-06-28 ENCOUNTER — Other Ambulatory Visit: Payer: Self-pay

## 2022-06-28 ENCOUNTER — Emergency Department (HOSPITAL_BASED_OUTPATIENT_CLINIC_OR_DEPARTMENT_OTHER)
Admission: EM | Admit: 2022-06-28 | Discharge: 2022-06-28 | Disposition: A | Discharge status: Home / Self Care | Payer: Medicaid Other | Attending: Emergency Medicine | Admitting: Emergency Medicine

## 2022-06-28 ENCOUNTER — Encounter (HOSPITAL_BASED_OUTPATIENT_CLINIC_OR_DEPARTMENT_OTHER): Payer: Self-pay | Admitting: Emergency Medicine

## 2022-06-28 DIAGNOSIS — G43909 Migraine, unspecified, not intractable, without status migrainosus: Secondary | ICD-10-CM | POA: Diagnosis not present

## 2022-06-28 DIAGNOSIS — Z91040 Latex allergy status: Secondary | ICD-10-CM | POA: Insufficient documentation

## 2022-06-28 LAB — PREGNANCY, URINE: Preg Test, Ur: NEGATIVE

## 2022-06-28 MED ORDER — LIDOCAINE HCL (PF) 1 % IJ SOLN
5.0000 mL | Freq: Once | INTRAMUSCULAR | Status: AC
  Administered 2022-06-28: 5 mL
  Filled 2022-06-28: qty 5

## 2022-06-28 MED ORDER — DEXAMETHASONE SODIUM PHOSPHATE 10 MG/ML IJ SOLN
10.0000 mg | Freq: Once | INTRAMUSCULAR | Status: AC
  Administered 2022-06-28: 10 mg via INTRAVENOUS
  Filled 2022-06-28: qty 1

## 2022-06-28 MED ORDER — ONDANSETRON HCL 4 MG/2ML IJ SOLN
INTRAMUSCULAR | Status: AC
  Administered 2022-06-28: 8 mg via INTRAVENOUS
  Filled 2022-06-28: qty 4

## 2022-06-28 MED ORDER — SODIUM CHLORIDE 0.9 % IV SOLN
8.0000 mg | Freq: Once | INTRAVENOUS | Status: AC
  Administered 2022-06-28: 8 mg via INTRAVENOUS
  Filled 2022-06-28: qty 4

## 2022-06-28 MED ORDER — PROMETHAZINE HCL 25 MG RE SUPP: 25.0000 mg | Freq: Four times a day (QID) | RECTAL | 0 refills | Status: DC | PRN

## 2022-06-28 MED ORDER — SODIUM CHLORIDE 0.9 % IV BOLUS
1000.0000 mL | Freq: Once | INTRAVENOUS | Status: AC
  Administered 2022-06-28: 1000 mL via INTRAVENOUS

## 2022-06-28 MED ORDER — MAGNESIUM SULFATE IN D5W 1-5 GM/100ML-% IV SOLN
1.0000 g | Freq: Once | INTRAVENOUS | Status: AC
  Administered 2022-06-28: 1 g via INTRAVENOUS
  Filled 2022-06-28: qty 100

## 2022-06-28 MED ORDER — KETOROLAC TROMETHAMINE 30 MG/ML IJ SOLN
15.0000 mg | Freq: Once | INTRAMUSCULAR | Status: AC
  Administered 2022-06-28: 15 mg via INTRAVENOUS
  Filled 2022-06-28: qty 1

## 2022-06-28 NOTE — ED Provider Notes (Signed)
[facility]ERGENC[facility] DEPT Provider Note   CSN: [id number] Arrival date & time: 06/28/22  1206     History  Chief Complaint  Patient presents with   Migraine    Michele Avila is a 31 y.o. female.  31 year old female with complaint of migraine headache onset Thursday (3 days ago).  Pain is located almost centrally in her head, described as being stabbed over and over again with an ice pick, associated with photophobia, phonophobia, nausea and vomiting.  Patient has been unable to keep down her meds to manage her migraine.  Patient did message her neurologist and was given a migraine cocktail in clinic on Thursday (3 days ago).  She feels that this helped initially but wore off and has been unable to tolerate anything p.o. since.  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  Reports similar migraines in the past, denies fevers or new features.       Home Medications Prior to Admission medications   Medication Sig Start Date End Date Taking? Authorizing Provider  promethazine (PHENERGAN) 25 MG suppository Place 1 suppository (25 mg total) rectally every 6 (six) hours as needed for nausea or vomiting. 06/28/22  Yes Tacy Learn, PA-C  buPROPion (WELLBUTRIN SR) 150 MG 12 hr tablet Take 1 tablet (150 mg total) by mouth 2 (two) times daily. 04/23/22   Orma Render, NP  cyclobenzaprine (FLEXERIL) 10 MG tablet Take 1 tablet (10 mg total) by mouth 3 (three) times daily as needed for muscle spasms. 06/11/22   Orma Render, NP  Fremanezumab-vfrm (AJOVY) 225 MG/1.5ML SOAJ 225 mg injection once a month for migraine prevention 04/23/22   Early, Coralee Pesa, NP  meloxicam (MOBIC) 15 MG tablet Take 1 tablet (15 mg total) by mouth daily. 06/11/22   Orma Render, NP  ondansetron (ZOFRAN-ODT) 4 MG disintegrating tablet Take 1 tablet (4 mg total) by mouth every 8 (eight) hours as needed for nausea or vomiting. 07/13/21   Hans Eden, NP  predniSONE (DELTASONE) 20 MG tablet Take '60mg'$  PO daily x 2 days, then'40mg'$  PO daily x 2 days, then '20mg'$  PO daily x 3 days 06/11/22   Early, Coralee Pesa, NP  Rimegepant Sulfate (NURTEC) 75 MG TBDP Take 75 mg by mouth daily as needed (Maximum 1 tablet in 24 hours). 08/28/21   Pieter Partridge, DO  Vitamin D, Ergocalciferol, (DRISDOL) 1.25 MG (50000 UNIT) CAPS capsule Take 1 capsule (50,000 Units total) by mouth every 7 (seven) days. Take for 12 total doses(weeks) than can transition to 1000 units OTC supplement daily 04/29/22   Early, Coralee Pesa, NP  cetirizine (ZYRTEC) 10 MG tablet Take 1 tablet (10 mg total) by mouth daily. 06/06/20 12/18/20  Loura Halt A, NP  esomeprazole (NEXIUM) 40 MG capsule Take 1 capsule (40 mg total) by mouth daily. Patient not taking: Reported on 09/16/2019 07/12/19 10/12/19  Vanessa Kick, MD  loratadine (CLARITIN) 10 MG tablet Take 1 tablet (10 mg total) by mouth daily. Patient not taking: Reported on 12/03/2019 10/12/19 01/16/20  Wieters, Madelynn Done C, PA-C      Allergies    Camphor-menthol, [name] [camphor-menthol], Yellow dyes (non-tartrazine), and Latex    Review of Systems   Review of Systems Negative except as per HPI Physical Exam Updated Vital Signs BP 121/84   Pulse 71   Temp 97.8 F (36.6 C)   Resp 18   SpO2 99%  Physical Exam Vitals and nursing note reviewed.  Constitutional:      General: She is not in acute distress.    Appearance: She is well-developed. She is not diaphoretic.     Comments: Appears uncomfortable, resting in the dark with an icepack behind head.  HENT:     Head: Normocephalic and atraumatic.     Nose: Nose normal.     Mouth/Throat:     Mouth: Mucous membranes are moist.  Eyes:     Extraocular Movements: Extraocular movements intact.     Conjunctiva/sclera: Conjunctivae normal.     Pupils: Pupils are equal, round, and reactive to light.  Pulmonary:     Effort: Pulmonary effort is normal.  Musculoskeletal:     Cervical back:  Neck supple.     Right lower leg: No edema.     Left lower leg: No edema.  Skin:    General: Skin is warm and dry.     Findings: No erythema or rash.  Neurological:     General: No focal deficit present.     Mental Status: She is alert and oriented to person, place, and time.     Cranial Nerves: No cranial nerve deficit.     Sensory: No sensory deficit.     Motor: No weakness.     Coordination: Coordination normal.     Gait: Gait normal.     Deep Tendon Reflexes: Reflexes normal.  Psychiatric:        Behavior: Behavior normal.     ED Results / Procedures / Treatments   Labs (all labs ordered are listed, but only abnormal results are displayed) Labs Reviewed  PREGNANCY, URINE    EKG None  Radiology No results found.  Procedures .Nerve Block  Date/Time: 06/28/2022 4:37 PM  Performed by: Tacy Learn, PA-C Authorized by: Tacy Learn, PA-C   Consent:    Consent obtained:  Verbal   Consent given by:  Patient   Risks, benefits, and alternatives were discussed: yes     Risks discussed:  Nerve damage, swelling, unsuccessful block, pain, infection and allergic reaction   Alternatives discussed:  No treatment Universal protocol:    Patient identity confirmed:  Verbally with patient Indications:    Indications:  Pain relief Location:    Body area:  Head   Head nerve blocked: phenopalantine.   Laterality:  Right Skin anesthesia:    Skin anesthesia method:  None Procedure details:    Steroid injected:  None   Additive injected:  None Post-procedure details:    Outcome:  Anesthesia achieved   Procedure completion:  Tolerated well, no immediate complications     Medications Ordered in ED Medications  sodium chloride 0.9 % bolus 1,000 mL (0 mLs Intravenous Stopped 06/28/22 1507)  dexamethasone (DECADRON) injection 10 mg (10 mg Intravenous Given 06/28/22 1409)  magnesium sulfate IVPB 1 g 100 mL (0 g Intravenous Stopped 06/28/22 1430)  ketorolac (TORADOL) 30 MG/ML  injection 15 mg (15 mg Intravenous Given 06/28/22 1408)  ondansetron (ZOFRAN) 8 mg in sodium chloride 0.9 % 50 mL IVPB (0 mg Intravenous Stopped 06/28/22 1430)  ondansetron (ZOFRAN) 4 MG/2ML injection (8 mg Intravenous (Continuous Infusion) Given 06/28/22 1420)  lidocaine (PF) (XYLOCAINE) 1 % injection 5 mL (5 mLs Other Given 06/28/22 1545)    ED Course/ Medical Decision Making/ A&P                           Medical Decision  Making Amount and/or Complexity of Data Reviewed Labs: ordered.  Risk Prescription drug management.   31 year old female with past medical history of migraines presents with complaint of migraine for the past several days, went to her neurologist, given headache cocktail which initially helped however headache has returned.  Has not typically had a headache last this long after a migraine cocktail, unable to tolerate her regular meds secondary to vomiting.  On exam, patient appears uncomfortable however no focal findings.  Patient was given IV fluids that she has been vomiting for the past few days with magnesium, Decadron, Toradol for her headache.  On recheck she has had some improvement but reports still having significant discomfort from her migraine.  She is agreeable to trial sphenopalatine block.  Lidocaine applied to Q-tip and placed in right nostril.  On recheck, patient has complete resolution of her migraine.  Patient feeling significantly better, requesting discharge.  Agreeable to follow-up with neurologist and return as needed.  She is given prescription for Phenergan suppositories to use for her nausea and vomiting and hopefully will be able to tolerate her home meds.        Final Clinical Impression(s) / ED Diagnoses Final diagnoses:  Migraine without status migrainosus, not intractable, unspecified migraine type    Rx / DC Orders ED Discharge Orders          Ordered    promethazine (PHENERGAN) 25 MG suppository  Every 6 hours PRN        06/28/22 1609               Tacy Learn, PA-C 06/28/22 1638    Gareth Morgan, MD 06/28/22 2345

## 2022-06-28 NOTE — Discharge Instructions (Signed)
Phenergan suppositories as needed as prescribed for nausea and vomiting.  Follow-up with your neurologist.

## 2022-06-28 NOTE — ED Triage Notes (Signed)
Pt had migraine on Thursday, pt was seen at neurologist that day and given cocktail but it has returned today and worse.

## 2022-06-28 NOTE — ED Notes (Signed)
Discharge instructions, follow up care, and prescriptions reviewed and explained, pt verbalized understanding. Pt caox4 and ambulatory on d/c.  

## 2022-06-30 ENCOUNTER — Ambulatory Visit (HOSPITAL_BASED_OUTPATIENT_CLINIC_OR_DEPARTMENT_OTHER): Payer: Medicaid Other | Admitting: Advanced Practice Midwife

## 2022-06-30 ENCOUNTER — Encounter (HOSPITAL_BASED_OUTPATIENT_CLINIC_OR_DEPARTMENT_OTHER): Payer: Self-pay | Admitting: Advanced Practice Midwife

## 2022-06-30 VITALS — BP 123/79 | HR 68 | Ht 69.0 in | Wt 255.6 lb

## 2022-06-30 DIAGNOSIS — Z3009 Encounter for other general counseling and advice on contraception: Secondary | ICD-10-CM

## 2022-06-30 DIAGNOSIS — Z01419 Encounter for gynecological examination (general) (routine) without abnormal findings: Secondary | ICD-10-CM

## 2022-06-30 NOTE — Progress Notes (Signed)
Subjective:     Michele Avila is a 31 y.o. female here at [facility] for a routine exam.  Current complaints: none.  Personal health questionnaire reviewed: yes.  Do you have a primary care provider? yes Do you feel safe at home? yes  [place] Visit from 06/30/2022 in [facility]  PHQ-2 Total Score 0       Health Maintenance Due  Topic Date Due   COVID-19 Vaccine (1) Never done   PAP SMEAR-Modifier  03/31/2019   INFLUENZA VACCINE  06/23/2022     Risk factors for chronic health problems: Smoking: Alchohol/how much: Pt BMI: Body mass index is 37.75 kg/m.   Gynecologic History Patient's last menstrual period was 06/23/2022 (approximate). Contraception: condoms Last Pap: 06/02/21. Results were: normal Last mammogram: n/a.   Obstetric History OB History  Gravida Para Term Preterm AB Living  '5 2 2   3 2  '$ SAB IAB Ectopic Multiple Live Births    2     1    # Outcome Date GA Lbr Len/2nd Weight Sex Delivery Anes PTL Lv  5 AB 2017          4 Term 06/15/13 [redacted]w[redacted]d 8 lb 13.6 oz (4.015 kg) M CS-LTranv Spinal  LIV  3 IAB 06/2012          2 IAB 10/2011          1 Term 10/28/07   9 lb 11 oz (4.394 kg) M CS-LTranv        Birth Comments: adopted     The following portions of the patient's history were reviewed and updated as appropriate: allergies, current medications, past family history, past medical history, past social history, past surgical history, and problem list.  Review of Systems Pertinent items noted in HPI and remainder of comprehensive ROS otherwise negative.    Objective:   BP 123/79 (BP Location: Right Arm, Patient Position: Sitting, Cuff Size: Large)   Pulse 68   Ht '5\' 9"'$  (1.753 m) Comment: Reported  Wt 255 lb 9.6 oz (115.9 kg)   LMP 06/23/2022 (Approximate)   BMI 37.75 kg/m  VS reviewed, nursing note reviewed,  Constitutional: well developed, well nourished, no distress HEENT: normocephalic CV: normal rate Pulm/chest  wall: normal effort Breast Exam: exam performed: right breast normal without mass, skin or nipple changes or axillary nodes, left breast normal without mass, skin or nipple changes or axillary nodes Abdomen: soft Neuro: alert and oriented x 3 Skin: warm, dry Psych: affect normal Pelvic exam: SSE deferred, Pap due 2025 Bimanual exam: Cervix 0/long/high, firm, anterior, neg CMT, uterus nontender, nonenlarged, adnexa without tenderness, enlargement, or mass       Assessment/Plan:   1. Well woman exam with routine gynecological exam --No gyn concerns, heavy regular menses, unchanged recently --[facility] pt phone in office today. Records requested to place in chart.  2. General counseling and advice for contraceptive management --Pt had Mirena IUD and was encouraged by neurology to have it removed due to migraines.  Migraines have not improved.  Pt reports possible weight gain, low vitamin D, and mood changes related to IUD but preferred the amenorrhea with IUD.   --Pt considering trying IUD again. Discussed option of Mirena and close follow up with removal if pt symptomatic, or using Kyleena IUD instead with lower hormone dose, but possibly not achieving amenorrhea.   --Pt to schedule IUD insertion visit in 2-3 weeks, pt using  condoms with long term partner at this time      Return for [facility] me if needed in 2-3 weeks for IUD insertion visit.   Fatima Blank, CNM 12:38 PM
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
## 2022-07-07 ENCOUNTER — Encounter: Payer: Self-pay | Admitting: Neurology

## 2022-07-07 ENCOUNTER — Ambulatory Visit (INDEPENDENT_AMBULATORY_CARE_PROVIDER_SITE_OTHER): Payer: Medicaid Other | Admitting: Neurology

## 2022-07-07 VITALS — BP 130/89 | HR 84 | Ht 69.0 in | Wt 259.6 lb

## 2022-07-07 DIAGNOSIS — G43011 Migraine without aura, intractable, with status migrainosus: Secondary | ICD-10-CM

## 2022-07-07 MED ORDER — SUMATRIPTAN 20 MG/ACT NA SOLN: NASAL | 5 refills | Status: DC

## 2022-07-07 MED ORDER — ONDANSETRON 8 MG PO TBDP: 8.0000 mg | ORAL_TABLET | Freq: Three times a day (TID) | ORAL | 5 refills | Status: DC | PRN

## 2022-07-07 MED ORDER — NURTEC 75 MG PO TBDP: 75.0000 mg | ORAL_TABLET | Freq: Every day | ORAL | 5 refills | Status: DC | PRN

## 2022-07-07 MED ORDER — EMGALITY 120 MG/ML ~~LOC~~ SOAJ: 240.0000 mg | Freq: Once | SUBCUTANEOUS | 0 refills | Status: AC

## 2022-07-07 MED ORDER — EMGALITY 120 MG/ML ~~LOC~~ SOAJ: 120.0000 mg | SUBCUTANEOUS | 5 refills | Status: DC

## 2022-07-07 NOTE — Patient Instructions (Signed)
Start Emgality - take 2 injections for first dose, then 1 injection every 28 days If Nurtec not effective within 1 hour, take sumatriptan nasal spray.  Increased Zofran to '8mg'$  for nausea Limit use of pain relievers to no more than 2 days out of week to prevent risk of rebound or medication-overuse headache. Keep headache diary Follow up 4 months.

## 2022-07-08 ENCOUNTER — Other Ambulatory Visit (HOSPITAL_COMMUNITY): Payer: Self-pay

## 2022-07-08 ENCOUNTER — Encounter (HOSPITAL_BASED_OUTPATIENT_CLINIC_OR_DEPARTMENT_OTHER): Payer: Self-pay | Admitting: Nurse Practitioner

## 2022-07-08 ENCOUNTER — Ambulatory Visit (INDEPENDENT_AMBULATORY_CARE_PROVIDER_SITE_OTHER): Payer: Medicaid Other | Admitting: Nurse Practitioner

## 2022-07-08 ENCOUNTER — Telehealth (HOSPITAL_COMMUNITY): Payer: Self-pay | Admitting: Pharmacy Technician

## 2022-07-08 VITALS — BP 120/86 | HR 89 | Ht 69.0 in | Wt 258.0 lb

## 2022-07-08 DIAGNOSIS — D239 Other benign neoplasm of skin, unspecified: Secondary | ICD-10-CM | POA: Diagnosis not present

## 2022-07-08 NOTE — Telephone Encounter (Signed)
Patient Advocate Encounter   Received notification that prior authorization for Emgality '120MG'$ /ML auto-injectors (migraine) is required.   PA submitted on 07/08/2022 Key [id number] Status is pending       Lyndel Safe, [place] Patient Advocate Specialist [place] Patient Advocate Team Direct Number: [phone]  Fax: [phone]

## 2022-07-08 NOTE — Telephone Encounter (Signed)
Patient Advocate Encounter  Received notification that the request for prior authorization for Emgality '120MG'$ /ML auto-injectors (migraine) has been denied due to The information provided does not show that you meet the criteria listed  You have tried two of the following drugs for at least one month: (A) Antidepressants (for example, amitriptyline, venlafaxine). (B) Beta blockers (for example, propranolol, metoprolol, timolol, atenolol). (C) Angiotensin converting enzyme inhibitors/angiotensin II receptor blockers (for example, lisinopril, candesartan). (D) Calcium channel blockers (for example, verapamil, nimodipine).       Lyndel Safe, [place] Patient Advocate Specialist [place] Patient Advocate Team Direct Number: [phone]  Fax: [phone]

## 2022-07-13 NOTE — Telephone Encounter (Signed)
Patient Advocate Encounter   Received notification that prior authorization for Emgality '120MG'$ /ML auto-injectors (migraine) is required.   PA submitted on 07/13/2022 Key [id number] Status is pending       Lyndel Safe, [place] Patient Advocate Specialist [place] Patient Advocate Team Direct Number: [phone]  Fax: [phone]

## 2022-07-14 ENCOUNTER — Other Ambulatory Visit (HOSPITAL_BASED_OUTPATIENT_CLINIC_OR_DEPARTMENT_OTHER): Payer: Self-pay | Admitting: Nurse Practitioner

## 2022-07-14 DIAGNOSIS — L089 Local infection of the skin and subcutaneous tissue, unspecified: Secondary | ICD-10-CM

## 2022-07-14 MED ORDER — MUPIROCIN 2 % EX OINT: TOPICAL_OINTMENT | CUTANEOUS | 3 refills | Status: DC

## 2022-07-14 MED ORDER — AMOXICILLIN-POT CLAVULANATE 875-125 MG PO TABS: 1.0000 | ORAL_TABLET | Freq: Two times a day (BID) | ORAL | 0 refills | Status: DC

## 2022-07-14 NOTE — Progress Notes (Signed)
Mekenna called to let us know that the areas of biopsy show signs of infection. We will start treatment with oral and topical antibiotics and she will monitor closely. If no improvement by Thursday, she will come in for evaluation.

## 2022-07-14 NOTE — Telephone Encounter (Signed)
Expedited Appeal for [facility] has been submitted

## 2022-07-15 ENCOUNTER — Ambulatory Visit (HOSPITAL_BASED_OUTPATIENT_CLINIC_OR_DEPARTMENT_OTHER): Payer: Medicaid Other | Admitting: Nurse Practitioner

## 2022-07-21 ENCOUNTER — Other Ambulatory Visit (HOSPITAL_COMMUNITY): Payer: Self-pay

## 2022-07-21 NOTE — Telephone Encounter (Signed)
Patient Advocate Encounter  Prior Authorization for Emgality '120MG'$ /ML auto-injectors (migraine) has been approved.        Lyndel Safe, [place] Patient Advocate Specialist [place] Patient Advocate Team Direct Number: [phone]  Fax: [phone]

## 2022-07-22 NOTE — Telephone Encounter (Signed)
Patient advised of Approval.  

## 2022-07-23 ENCOUNTER — Telehealth (HOSPITAL_COMMUNITY): Payer: Self-pay | Admitting: Pharmacy Technician

## 2022-07-23 ENCOUNTER — Telehealth: Payer: Self-pay | Admitting: Neurology

## 2022-07-23 NOTE — Telephone Encounter (Signed)
Talked to patient and she is going to try to send her mom to pick up the sample of emgality so she can start the loading dose. Moms name is Manufacturing engineer

## 2022-07-23 NOTE — Telephone Encounter (Signed)
Patient was told to call our office to see if there was a PA sent in for her emgality. She still isnt able to get the loading dose.

## 2022-07-23 NOTE — Telephone Encounter (Signed)
Patient Advocate Encounter   Received notification that prior authorization for Emgality '120MG'$ /ML auto-injectors (migraine) is required for Loading Dose   PA submitted on 07/23/2022 Key [id number] Status is pending       Lyndel Safe, [place] Patient Advocate Specialist [place] Patient Advocate Team Direct Number: [phone]  Fax: [phone]

## 2022-07-24 NOTE — Telephone Encounter (Signed)
Patient Advocate Encounter  Received notification that the request for prior authorization for Emgality '120MG'$ /ML auto-injectors (migraine) Loading Dose has been denied   Lyndel Safe, [place] Patient Advocate Specialist [place] Patient Advocate Team Direct Number: [phone]  Fax: [phone]

## 2022-08-10 ENCOUNTER — Encounter (HOSPITAL_BASED_OUTPATIENT_CLINIC_OR_DEPARTMENT_OTHER): Payer: Self-pay | Admitting: Nurse Practitioner

## 2022-08-10 DIAGNOSIS — D239 Other benign neoplasm of skin, unspecified: Secondary | ICD-10-CM | POA: Insufficient documentation

## 2022-08-10 NOTE — Assessment & Plan Note (Signed)
Skin Procedure Left lateral thigh and right posterior thigh Lesion Location/Size: Provider: Coralee Pesa Artasia Thang Consent:  Risks, benefits, and alternative treatments discussed and all questions were answered.  Patient elected to proceed and verbal consent obtained.  Description: Area prepped and draped using sterile technique. Area locally anesthetized using 0.3 cc's of lidocaine 1% with epi with HCO3 at 4:1 Ratio.  {Blank single:19197::"Lesion biopsied using a punch biopsy size 4 mm.   Adequate hemostastis achieved using Silver Nitrate  Biopsy site closed using skin adhesive.  Wound dressed after application of bacitracin ointment.  Good cosmetic appearance.  Complications: None Pathology: Previously performed to show dermatofibroma  Post Procedure Instructions: Wound care instructions discussed and patient was instructed to keep area clean and dry.  Signs and symptoms of infection discussed, patient agrees to contact the office ASAP should they occur.  Dressing change recommended daily. Patient informed that areas of cryotherapy possible will blister.  Encouraged avoidance of popping blister.  Application of  bacitracin recommended should blistering occur.  Signs and symptoms of infection discussed and patient encouraged to contact the clinic ASAP for concerns.  Follow Up: PRN

## 2022-08-10 NOTE — Progress Notes (Signed)
  Worthy Keeler, DNP, AGNP-c [place] [address] [place], Michele Avila [address] [phone] Office [phone] Fax  ESTABLISHED PATIENT- Chronic Health and/or Follow-Up Visit  Blood pressure 120/86, pulse 89, height '5\' 9"'$  (1.753 m), weight 258 lb (117 kg), last menstrual period 06/23/2022, SpO2 99 %.    Michele Avila is a 31 y.o. year old female presenting today for evaluation and management of the following: Procedure (Patient presents today for removal of moles)  Michele Avila has several nodules on her lower extremities that she would like to have removed. These have been biopsied in the past and show dermatofibroma presence. There are several nodules that are particularly large that she would like taken off today.   All ROS negative with exception of what is listed above.   PHYSICAL EXAM Physical Exam Vitals and nursing note reviewed.  Constitutional:      Appearance: Normal appearance.  HENT:     Head: Normocephalic.  Cardiovascular:     Rate and Rhythm: Normal rate and regular rhythm.     Pulses: Normal pulses.     Heart sounds: Normal heart sounds.  Pulmonary:     Effort: Pulmonary effort is normal.     Breath sounds: Normal breath sounds.  Musculoskeletal:     Cervical back: Normal range of motion.  Skin:    General: Skin is warm and dry.     Capillary Refill: Capillary refill takes less than 2 seconds.     Comments: Scattered dermatofibroma on bilateral upper thighs.   Neurological:     General: No focal deficit present.     Mental Status: She is alert and oriented to person, place, and time.  Psychiatric:        Mood and Affect: Mood normal.        Behavior: Behavior normal.        Thought Content: Thought content normal.        Judgment: Judgment normal.     PLAN Problem List Items Addressed This Visit     Dermatofibroma - Primary    Skin Procedure Left lateral thigh and right posterior thigh Lesion  Location/Size: Provider: Coralee Pesa Jadelin Eng Consent:  Risks, benefits, and alternative treatments discussed and all questions were answered.  Patient elected to proceed and verbal consent obtained.  Description: Area prepped and draped using sterile technique. Area locally anesthetized using 0.3 cc's of lidocaine 1% with epi with HCO3 at 4:1 Ratio.  {Blank single:19197::"Lesion biopsied using a punch biopsy size 4 mm.   Adequate hemostastis achieved using Silver Nitrate  Biopsy site closed using skin adhesive.  Wound dressed after application of bacitracin ointment.  Good cosmetic appearance.  Complications: None Pathology: Previously performed to show dermatofibroma  Post Procedure Instructions: Wound care instructions discussed and patient was instructed to keep area clean and dry.  Signs and symptoms of infection discussed, patient agrees to contact the office ASAP should they occur.  Dressing change recommended daily. Patient informed that areas of cryotherapy possible will blister.  Encouraged avoidance of popping blister.  Application of  bacitracin recommended should blistering occur.  Signs and symptoms of infection discussed and patient encouraged to contact the clinic ASAP for concerns.  Follow Up: PRN        Return if symptoms worsen or fail to improve.   Worthy Keeler, DNP, AGNP-c 07/08/2022  9:12 AM

## 2022-08-23 ENCOUNTER — Other Ambulatory Visit: Payer: Self-pay

## 2022-08-23 ENCOUNTER — Emergency Department (HOSPITAL_COMMUNITY)
Admission: EM | Admit: 2022-08-23 | Discharge: 2022-08-24 | Disposition: A | Discharge status: Home / Self Care | Payer: Medicaid Other | Attending: Emergency Medicine | Admitting: Emergency Medicine

## 2022-08-23 ENCOUNTER — Encounter (HOSPITAL_COMMUNITY): Payer: Self-pay | Admitting: Emergency Medicine

## 2022-08-23 DIAGNOSIS — T7840XA Allergy, unspecified, initial encounter: Secondary | ICD-10-CM | POA: Insufficient documentation

## 2022-08-23 DIAGNOSIS — R000 Tachycardia, unspecified: Secondary | ICD-10-CM | POA: Diagnosis not present

## 2022-08-23 MED ORDER — FAMOTIDINE IN NACL 20-0.9 MG/50ML-% IV SOLN
20.0000 mg | Freq: Once | INTRAVENOUS | Status: AC
  Administered 2022-08-24: 20 mg via INTRAVENOUS
  Filled 2022-08-23: qty 50

## 2022-08-23 MED ORDER — EPINEPHRINE 0.3 MG/0.3ML IJ SOAJ
0.3000 mg | Freq: Once | INTRAMUSCULAR | Status: AC
  Administered 2022-08-23: 0.3 mg via INTRAMUSCULAR
  Filled 2022-08-23: qty 0.3

## 2022-08-23 MED ORDER — METHYLPREDNISOLONE SODIUM SUCC 125 MG IJ SOLR
125.0000 mg | Freq: Once | INTRAMUSCULAR | Status: AC
  Administered 2022-08-24: 125 mg via INTRAVENOUS
  Filled 2022-08-23: qty 2

## 2022-08-23 NOTE — ED Triage Notes (Addendum)
Patient reports sudden onset skin itching with rashes at face and arms with throat itching this evening after using cleaning solution for bathroom at home this evening . No oral swelling Cherre Robins intact. She took Benadryl at home prior to arrival .

## 2022-08-23 NOTE — ED Provider Notes (Signed)
[facility] Emergency Department Provider Note MRN:  [id number]  [place] date & time: 08/24/22     Chief Complaint   Allergic Reaction   History of Present Illness   Michele Avila is a 31 y.o. year-old female presents to the ED with chief complaint of allergic reaction.  She states she was cleaning her bathroom tonight and then sat down for a late dinner.  She noticed that she had rash on her arms and legs.  She reports associated scratchy sensation in her throat and felt like she was having some swelling of her face.  She reports new associated cough.  She took Benadryl prior to arrival (50 mg).  She states that she has had a severe allergic reaction remotely.  She attributes this to cleaning product.  History provided by patient.   Review of Systems  Pertinent positive and negative review of systems noted in HPI.    Physical Exam   Vitals:   08/24/22 0215 08/24/22 0230  BP: 128/77 114/80  Pulse: 78 69  Resp: 16 (!) 26  Temp:    SpO2: 98% 99%    CONSTITUTIONAL:  uncomfortable-appearing, NAD NEURO:  Alert and oriented x 3, CN 3-12 grossly intact EYES:  eyes equal and reactive ENT/NECK:  Supple, no stridor, erythematous oropharynx, but without edema, mild swelling of face CARDIO:  tachycardic, regular rhythm, appears well-perfused  PULM:  No respiratory distress, clear to auscultation bilaterally GI/GU:  non-distended,  MSK/SPINE:  No gross deformities, no edema, moves all extremities  SKIN: Faint hives to extremities   *Additional and/or pertinent findings included in MDM below  Diagnostic and Interventional Summary    EKG Interpretation  Date/Time:  Sunday August 23 2022 23:57:56 EDT Ventricular Rate:  107 PR Interval:  146 QRS Duration: 83 QT Interval:  331 QTC Calculation: 442 R Axis:   57 Text Interpretation: Sinus tachycardia Confirmed by Randal Buba, April ([id number]) on 08/23/2022 11:59:19 PM       Labs Reviewed - No data to display   No orders to display    Medications  EPINEPHrine (EPI-PEN) injection 0.3 mg (0.3 mg Intramuscular Given 08/23/22 2346)  famotidine (PEPCID) IVPB 20 mg premix (0 mg Intravenous Stopped 08/24/22 0149)  methylPREDNISolone sodium succinate (SOLU-MEDROL) 125 mg/2 mL injection 125 mg (125 mg Intravenous Given 08/24/22 0110)     Procedures  /  Critical Care .Critical Care  Performed by: Montine Circle, PA-C Authorized by: Montine Circle, PA-C   Critical care provider statement:    Critical care time (minutes):  35   Critical care was necessary to treat or prevent imminent or life-threatening deterioration of the following conditions: allergic reaction.   Critical care was time spent personally by me on the following activities:  Development of treatment plan with patient or surrogate, discussions with consultants, evaluation of patient's response to treatment, examination of patient, ordering and review of laboratory studies, ordering and review of radiographic studies, ordering and performing treatments and interventions, pulse oximetry, re-evaluation of patient's condition and review of old charts   ED Course and Medical Decision Making  I have reviewed the triage vital signs, the nursing notes, and pertinent available records from the EMR.  Social Determinants Affecting Complexity of Care: Patient has no clinically significant social determinants affecting this chief complaint..   ED Course:    Medical Decision Making Patient here with allergic reaction.  Has had tachycardia and some throat swelling/scratchy sensation.  She is also been coughing.  Will administer IM EpiPen.  Monitor  for 4 hours.  We will give Solu-Medrol and Pepcid.  Patient took 50 mg Benadryl prior to arrival.  She has had severe reactions in the past as a child.  Patient reassessed.  Has been observed for greater than 4 hours in the ED.  Will discharge home.  EpiPen sent to pharmacy.  Advise follow-up with  allergist.  Risk OTC drugs. Prescription drug management.     Consultants: No consultations were needed in caring for this patient.   Treatment and Plan: Emergency department workup does not suggest an emergent condition requiring admission or immediate intervention beyond  what has been performed at this time. The patient is safe for discharge and has  been instructed to return immediately for worsening symptoms, change in  symptoms or any other concerns    Final Clinical Impressions(s) / ED Diagnoses     ICD-10-CM   1. Allergic reaction, initial encounter  T78.40XA       ED Discharge Orders          Ordered    predniSONE (DELTASONE) 20 MG tablet  Daily        08/24/22 0359    EPINEPHrine 0.3 mg/0.3 mL IJ SOAJ injection  As needed        08/24/22 0359    cetirizine (ZYRTEC ALLERGY) 10 MG tablet  Daily        08/24/22 0359              Discharge Instructions Discussed with and Provided to Patient:   Discharge Instructions   None      Montine Circle, PA-C 08/24/22 0413    Palumbo, April, MD 08/24/22 5681

## 2022-08-24 MED ORDER — EPINEPHRINE 0.3 MG/0.3ML IJ SOAJ: 0.3000 mg | INTRAMUSCULAR | 0 refills | Status: DC | PRN

## 2022-08-24 MED ORDER — CETIRIZINE HCL 10 MG PO TABS: 10.0000 mg | ORAL_TABLET | Freq: Every day | ORAL | 0 refills | Status: DC

## 2022-08-24 MED ORDER — PREDNISONE 20 MG PO TABS: 40.0000 mg | ORAL_TABLET | Freq: Every day | ORAL | 0 refills | Status: DC

## 2022-08-25 ENCOUNTER — Encounter (HOSPITAL_BASED_OUTPATIENT_CLINIC_OR_DEPARTMENT_OTHER): Payer: Self-pay | Admitting: Nurse Practitioner

## 2022-08-26 ENCOUNTER — Telehealth: Payer: Self-pay | Admitting: *Deleted

## 2022-08-26 NOTE — Patient Outreach (Signed)
Care Coordination  08/26/2022  Michele Avila 10-20-1991 [id number]  Transition Care Management Follow-up Telephone Call Date of discharge and from where: 08/24/22, [facility] How have you been since you were released from the hospital? "I'm doing ok." Any questions or concerns? Yes, Patient shared with RNCM that she is allergic to menthol camphor, which is similar to an ingredient in the cleaning product that she was using prior to her allergic reaction.  Items Reviewed: Did the pt receive and understand the discharge instructions provided? Yes  Medications obtained and verified? Yes  Other? Yes RNCM discussed natural cleaning products and encouraged patient to discard the product that she was using. Any new allergies since your discharge? No  Dietary orders reviewed? No Do you have support at home? Yes   Home Care and Equipment/Supplies: Were home health services ordered? no If so, what is the name of the agency? N/A  Has the agency set up a time to come to the patient's home? not applicable Were any new equipment or medical supplies ordered?  No What is the name of the medical supply agency? N/A Were you able to get the supplies/equipment? not applicable Do you have any questions related to the use of the equipment or supplies? No  Functional Questionnaire: (I = Independent and D = Dependent) ADLs: I  Bathing/Dressing- I  Meal Prep- I  Eating- I  Maintaining continence- I  Transferring/Ambulation- I  Managing Meds- I I Follow up appointments reviewed:  PCP Hospital f/u appt confirmed? Yes  Scheduled to see 08/27/22 for video visit. [facility] f/u appt confirmed?  N/A, ED visit   Are transportation arrangements needed? No  If their condition worsens, is the pt aware to call PCP or go to the Emergency Dept.? Yes Was the patient provided with contact information for the PCP's office or ED? No Was to pt encouraged to call back with questions or concerns?  Yes  Lurena Joiner RN, BSN [place]  [employer] manager

## 2022-08-27 ENCOUNTER — Telehealth (INDEPENDENT_AMBULATORY_CARE_PROVIDER_SITE_OTHER): Payer: Medicaid Other | Admitting: Nurse Practitioner

## 2022-08-27 DIAGNOSIS — F410 Panic disorder [episodic paroxysmal anxiety] without agoraphobia: Secondary | ICD-10-CM

## 2022-08-27 MED ORDER — HYDROXYZINE HCL 25 MG PO TABS: 25.0000 mg | ORAL_TABLET | Freq: Every evening | ORAL | 11 refills | Status: DC | PRN

## 2022-08-27 NOTE — Progress Notes (Addendum)
Virtual Visit Encounter telephone visit.   I connected with  Michele Avila on 08/31/22 at  1:10 PM EDT by secure audio telemedicine application. I verified that I am speaking with the correct person using two identifiers.   I introduced myself as a Designer, jewellery with the practice. The limitations of evaluation and management by telemedicine discussed with the patient and the availability of in person appointments. The patient expressed verbal understanding and consent to proceed.  Participating parties in this visit include: Myself and patient  The patient is: Patient Location: Home/Work I am: Provider Location: Office/Clinic Subjective:    CC and HPI: Michele Avila is a 31 y.o. year old female presenting for follow up of anaphylactic reaction to cleaning product. Patient reports the following:  Anaphylactic Reaction Michele Avila tells me she was cleaning her bathroom with a new product and noted that she started to get hives on her arms and swelling. A short while later she reports that a friend who was with her realized her face was swelling. Her friend took her to the ED and she tells me by the time she arrived her throat was swelling shut and she was having difficulty breathing. In the ED she received epinephrine and steroids and her symptoms resolved. 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 She tells me that after investigating she has learned that the product she was using contains methyl salicylate, which is chemically similar.  She reports that she has not had any additional allergic reactions and all of her symptoms have resolved with exception of cough.  She reports that since the reaction she has been experiencing significantly increased anxiety, specifically at night. She endorses waking nightly in panic with coughing, shortness of breath, choking sensation, and rapid heart rate. She tells me she often finds her self reaching out when she is waking. She is  unable to remember what she is dreaming. Once she is awake she is unable to fall back asleep. She reports that she has only had about 3 hours of sleep each night since the hospital visit.     Past medical history, Surgical history, Family history not pertinant except as noted below, Social history, Allergies, and medications have been entered into the medical record, reviewed, and corrections made.   Review of Systems:  All review of systems negative except what is listed in the HPI  Objective:    Alert and oriented x 4 Speaking in clear sentences with no shortness of breath. No distress.  Impression and Recommendations:    Problem List Items Addressed This Visit     Panic anxiety syndrome - Primary    Increase symptoms of panic and anxiety with interference with sleep related to recent allergic reaction.  Discussion with patient on medication options that may be helpful for management.  At this time we will trial hydroxyzine at bedtime to see if this is helpful to allow her to rest and reduce her anxiety symptoms.  We will plan to follow-up if symptoms worsen or fail to improve.      Relevant Medications   hydrOXYzine (ATARAX) 25 MG tablet    orders and follow up as documented in EMR I discussed the assessment and treatment plan with the patient. The patient was provided an opportunity to ask questions and all were answered. The patient agreed with the plan and demonstrated an understanding of the instructions.   The patient was advised to call back or seek an in-person evaluation if  the symptoms worsen or if the condition fails to improve as anticipated.  Follow-Up: prn  I provided 26 minutes of non-face-to-face interaction with this non face-to-face encounter including intake, same-day documentation, and chart review.   Orma Render, NP , DNP, AGNP-c [place] [facility] [phone]  (fax)

## 2022-08-31 DIAGNOSIS — F410 Panic disorder [episodic paroxysmal anxiety] without agoraphobia: Secondary | ICD-10-CM | POA: Insufficient documentation

## 2022-08-31 NOTE — Assessment & Plan Note (Signed)
Increase symptoms of panic and anxiety with interference with sleep related to recent allergic reaction.  Discussion with patient on medication options that may be helpful for management.  At this time we will trial hydroxyzine at bedtime to see if this is helpful to allow her to rest and reduce her anxiety symptoms.  We will plan to follow-up if symptoms worsen or fail to improve.

## 2022-09-03 ENCOUNTER — Encounter (HOSPITAL_BASED_OUTPATIENT_CLINIC_OR_DEPARTMENT_OTHER): Payer: Self-pay | Admitting: Nurse Practitioner

## 2022-09-14 ENCOUNTER — Telehealth: Payer: Self-pay | Admitting: Neurology

## 2022-09-14 NOTE — Telephone Encounter (Signed)
I have sent this up front for scheduling. Patient wanted me to document that the episode is the same as last time but lasted longer this time .She said   no headache has been  involved

## 2022-09-14 NOTE — Telephone Encounter (Signed)
Patient called to say over the weekend she had a bad experience. Her whole body went limp like pins and needles, with a vibrating sensation, and she was non-responsive for 5-7 minutes but she could hear.   He partner witnessed the event.  Today she is very tired.  She is not sure what to do about this and is wanting Dr. Georgie Chard advice.

## 2022-09-15 ENCOUNTER — Encounter (HOSPITAL_BASED_OUTPATIENT_CLINIC_OR_DEPARTMENT_OTHER): Payer: Self-pay

## 2022-09-15 ENCOUNTER — Encounter (HOSPITAL_BASED_OUTPATIENT_CLINIC_OR_DEPARTMENT_OTHER): Payer: Self-pay | Admitting: Nurse Practitioner

## 2022-09-15 ENCOUNTER — Ambulatory Visit (HOSPITAL_BASED_OUTPATIENT_CLINIC_OR_DEPARTMENT_OTHER): Payer: Medicaid Other | Admitting: Nurse Practitioner
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
## 2022-09-16 ENCOUNTER — Encounter: Payer: Self-pay | Admitting: Neurology

## 2022-09-16 ENCOUNTER — Ambulatory Visit (INDEPENDENT_AMBULATORY_CARE_PROVIDER_SITE_OTHER): Payer: Medicaid Other | Admitting: Neurology

## 2022-09-16 VITALS — BP 125/86 | HR 91 | Ht 69.0 in | Wt 258.2 lb

## 2022-09-16 DIAGNOSIS — H5711 Ocular pain, right eye: Secondary | ICD-10-CM | POA: Diagnosis not present

## 2022-09-16 DIAGNOSIS — G43109 Migraine with aura, not intractable, without status migrainosus: Secondary | ICD-10-CM | POA: Diagnosis not present

## 2022-09-16 DIAGNOSIS — R29818 Other symptoms and signs involving the nervous system: Secondary | ICD-10-CM | POA: Diagnosis not present

## 2022-09-16 MED ORDER — EMGALITY 120 MG/ML ~~LOC~~ SOAJ
120.0000 mg | SUBCUTANEOUS | 5 refills | Status: DC
Start: 2022-09-16 — End: 2023-02-24

## 2022-09-16 NOTE — Patient Instructions (Addendum)
Check EEG.  If unremarkable, will get a 24 hour ambulatory EEG If you do not have control to drive during these episodes, you should refrain from driving for about 6 months Check MRI of orbits with and without contrast No change in medication otherwise. Follow up 3 months.

## 2022-09-23 ENCOUNTER — Ambulatory Visit: Payer: Medicaid Other | Admitting: Neurology

## 2022-09-23 DIAGNOSIS — R29818 Other symptoms and signs involving the nervous system: Secondary | ICD-10-CM

## 2022-09-23 NOTE — Progress Notes (Signed)
EEG complete - results pending 

## 2022-09-25 ENCOUNTER — Telehealth: Payer: Self-pay | Admitting: Neurology

## 2022-09-25 DIAGNOSIS — R29818 Other symptoms and signs involving the nervous system: Secondary | ICD-10-CM

## 2022-09-25 NOTE — Telephone Encounter (Signed)
Routine eeg is normal.  Would like to order 24 hour ambulatory EEG for seizure-like activity

## 2022-09-25 NOTE — Addendum Note (Signed)
Addended by: Venetia Night on: 09/25/2022 12:47 PM   Modules accepted: Orders

## 2022-09-25 NOTE — Telephone Encounter (Signed)
Patient advised.  Front desk please call patient to schedule amb. EEG

## 2022-09-25 NOTE — Progress Notes (Signed)
ELECTROENCEPHALOGRAM REPORT  Date of Study: 09/24/2022  Patient's Name: Michele Avila MRN: [id number] Date of Birth: 05/15/1991   Clinical History: 31 year old female with migraines presenting with seizure-like activity.  Medications: Wellbutrin Emgality Flexeril Nurtec sumatriptan  Technical Summary: A multichannel digital EEG recording measured by the international 10-20 system with electrodes applied with paste and impedances below 5000 ohms performed in our laboratory with EKG monitoring in an awake and asleep patient.  Photic stimulation was performed.  The digital EEG was referentially recorded, reformatted, and digitally filtered in a variety of bipolar and referential montages for optimal display.    Description: The patient is awake and asleep during the recording.  During maximal wakefulness, there is a symmetric, medium voltage 10 Hz posterior dominant rhythm that attenuates with eye opening.  The record is symmetric.  During drowsiness and sleep, there is an increase in theta slowing of the background.  Stage 2 sleep was seen.  Photic stimulation did not elicit any abnormalities.  There were no epileptiform discharges or electrographic seizures seen.    EKG lead was unremarkable.  Impression: This awake and asleep EEG is normal.    Clinical Correlation: A normal EEG does not exclude a clinical diagnosis of epilepsy.  If further clinical questions remain, prolonged EEG may be helpful.  Clinical correlation is advised.   Metta Clines, DO

## 2022-09-30 ENCOUNTER — Encounter (HOSPITAL_BASED_OUTPATIENT_CLINIC_OR_DEPARTMENT_OTHER): Payer: Self-pay | Admitting: Nurse Practitioner

## 2022-09-30 ENCOUNTER — Telehealth: Payer: Self-pay | Admitting: Anesthesiology

## 2022-09-30 NOTE — Telephone Encounter (Signed)
Patient left message to say she has not heard back from the office to get her scheduled for an EEG. Patient requests call back.

## 2022-10-04 ENCOUNTER — Ambulatory Visit
Admission: RE | Admit: 2022-10-04 | Discharge: 2022-10-04 | Disposition: A | Discharge status: Home / Self Care | Payer: Medicaid Other | Source: Ambulatory Visit | Attending: Neurology | Admitting: Neurology

## 2022-10-04 DIAGNOSIS — H5711 Ocular pain, right eye: Secondary | ICD-10-CM

## 2022-10-04 MED ORDER — GADOPICLENOL 0.5 MMOL/ML IV SOLN
10.0000 mL | Freq: Once | INTRAVENOUS | Status: AC | PRN
  Administered 2022-10-04: 10 mL via INTRAVENOUS

## 2022-10-10 ENCOUNTER — Other Ambulatory Visit (HOSPITAL_BASED_OUTPATIENT_CLINIC_OR_DEPARTMENT_OTHER): Payer: Self-pay | Admitting: Nurse Practitioner

## 2022-10-10 DIAGNOSIS — F3132 Bipolar disorder, current episode depressed, moderate: Secondary | ICD-10-CM

## 2022-10-10 DIAGNOSIS — Z720 Tobacco use: Secondary | ICD-10-CM

## 2022-10-10 DIAGNOSIS — M545 Low back pain, unspecified: Secondary | ICD-10-CM

## 2022-10-10 DIAGNOSIS — E559 Vitamin D deficiency, unspecified: Secondary | ICD-10-CM

## 2022-10-23 ENCOUNTER — Encounter: Payer: Self-pay | Admitting: Nurse Practitioner

## 2022-10-26 ENCOUNTER — Emergency Department (HOSPITAL_BASED_OUTPATIENT_CLINIC_OR_DEPARTMENT_OTHER): Payer: Medicaid Other | Admitting: Radiology

## 2022-10-26 ENCOUNTER — Other Ambulatory Visit: Payer: Self-pay

## 2022-10-26 ENCOUNTER — Emergency Department (HOSPITAL_BASED_OUTPATIENT_CLINIC_OR_DEPARTMENT_OTHER)
Admission: EM | Admit: 2022-10-26 | Discharge: 2022-10-26 | Disposition: A | Discharge status: Home / Self Care | Payer: Medicaid Other | Attending: Emergency Medicine | Admitting: Emergency Medicine

## 2022-10-26 ENCOUNTER — Encounter (HOSPITAL_BASED_OUTPATIENT_CLINIC_OR_DEPARTMENT_OTHER): Payer: Self-pay

## 2022-10-26 DIAGNOSIS — X501XXA Overexertion from prolonged static or awkward postures, initial encounter: Secondary | ICD-10-CM | POA: Diagnosis not present

## 2022-10-26 DIAGNOSIS — Z7951 Long term (current) use of inhaled steroids: Secondary | ICD-10-CM | POA: Diagnosis not present

## 2022-10-26 DIAGNOSIS — Y92812 Truck as the place of occurrence of the external cause: Secondary | ICD-10-CM | POA: Insufficient documentation

## 2022-10-26 DIAGNOSIS — Z91040 Latex allergy status: Secondary | ICD-10-CM | POA: Insufficient documentation

## 2022-10-26 DIAGNOSIS — J45909 Unspecified asthma, uncomplicated: Secondary | ICD-10-CM | POA: Insufficient documentation

## 2022-10-26 DIAGNOSIS — S99911A Unspecified injury of right ankle, initial encounter: Secondary | ICD-10-CM | POA: Insufficient documentation

## 2022-10-26 NOTE — Discharge Instructions (Signed)
Return to the ED with any new or worsening signs or symptoms Please follow-up with Dr. Raeford Razor of sports medicine Please remain in cam boot and utilize crutches provided.  Please remain in the cam boot while you are walking.  You may take the cam boot off at night to rest. Please utilize RICE protocol.  This is rest, ice, compression and elevation of the affected limb. Please continue taking ibuprofen for pain control.  Please continue icing the ankle.  You may also apply heat.

## 2022-10-26 NOTE — ED Notes (Signed)
Discharge instructions, follow up care, and prescriptions reviewed and explained, pt verbalized understanding.  

## 2022-10-26 NOTE — ED Triage Notes (Addendum)
Pt states she was getting out of a tall truck and twisted her rt. Ankle 2 days ago. Now pain has moved up her calf. 7/10 without bearing weight.   In [facility] in triage

## 2022-10-26 NOTE — ED Provider Notes (Signed)
[facility] DE[facility] Provider Note   CSN: [id number] Arrival date & time: 10/26/22  1543     History  Chief Complaint  Patient presents with   Ankle Injury    Michele Avila is a 31 y.o. female with medical history of anemia, asthma, migraines.  Patient presents to ED for evaluation of right ankle injury.  Patient reports 2 days ago she stepped down off of a very tall truck and rolled her right ankle.  The patient reports that since this time she has had progressively worsening right ankle pain located on the lateral side of her ankle that is been creeping up her right leg.  The patient states she has been walking on the leg for the last 2 days, "hobbling" when she can.  The patient denies any numbness or tingling in the foot.  Patient reports has been taking ibuprofen and icing her foot at home which has helped alleviate pain slightly.   Ankle Injury       Home Medications Prior to Admission medications   Medication Sig Start Date End Date Taking? Authorizing Provider  buPROPion (WELLBUTRIN SR) 150 MG 12 hr tablet TAKE 1 TABLET(150 MG) BY MOUTH TWICE DAILY 10/17/22   Early, Coralee Pesa, NP  cetirizine (ZYRTEC ALLERGY) 10 MG tablet Take 1 tablet (10 mg total) by mouth daily. 08/24/22   Montine Circle, PA-C  cyclobenzaprine (FLEXERIL) 10 MG tablet Take 1 tablet (10 mg total) by mouth 3 (three) times daily as needed for muscle spasms. 06/11/22   Orma Render, NP  EPINEPHrine 0.3 mg/0.3 mL IJ SOAJ injection Inject 0.3 mg into the muscle as needed for anaphylaxis. 08/24/22   Montine Circle, PA-C  Galcanezumab-gnlm (EMGALITY) 120 MG/ML SOAJ Inject 120 mg into the skin every 28 (twenty-eight) days. 09/16/22   Pieter Partridge, DO  hydrOXYzine (ATARAX) 25 MG tablet Take 1 tablet (25 mg total) by mouth at bedtime and may repeat dose one time if needed. For sleep and anxiety 08/27/22   Early, Coralee Pesa, NP  mupirocin ointment (BACTROBAN) 2 % Apply to affected area TID for 7  days. Patient not taking: Reported on 08/24/2022 07/14/22   Early, Coralee Pesa, NP  ondansetron (ZOFRAN-ODT) 8 MG disintegrating tablet Take 1 tablet (8 mg total) by mouth every 8 (eight) hours as needed for nausea or vomiting. 07/07/22   Pieter Partridge, DO  predniSONE (DELTASONE) 20 MG tablet Take 2 tablets (40 mg total) by mouth daily. 08/24/22   Montine Circle, PA-C  promethazine (PHENERGAN) 25 MG suppository Place 1 suppository (25 mg total) rectally every 6 (six) hours as needed for nausea or vomiting. 06/28/22   Tacy Learn, PA-C  Rimegepant Sulfate (NURTEC) 75 MG TBDP Take 75 mg by mouth daily as needed (Maximum 1 tablet in 24 hours). 07/07/22   Pieter Partridge, DO  SUMAtriptan (IMITREX) 20 MG/ACT nasal spray 1 spray in one nostril.  May repeat in 2 hours if headache persists or recurs.  Maximum 2 sprays in 24 hours. 07/07/22   Pieter Partridge, DO  Vitamin D, Ergocalciferol, (DRISDOL) 1.25 MG (50000 UNIT) CAPS capsule TAKE ONE CAPSULE BY MOUTH EVERY 7 DAYS FOR 12 WEEKS THEN TRANSITION TO DAILY OTC SUPPLEMENT 10/17/22   Early, Coralee Pesa, NP  esomeprazole (NEXIUM) 40 MG capsule Take 1 capsule (40 mg total) by mouth daily. Patient not taking: Reported on 09/16/2019 07/12/19 10/12/19  Vanessa Kick, MD  loratadine (CLARITIN) 10 MG tablet Take 1 tablet (10 mg total) by mouth  daily. Patient not taking: Reported on 12/03/2019 10/12/19 01/16/20  Wieters, Madelynn Done C, PA-C      Allergies    Camphor-menthol, Methyl salicylate, [name] [camphor-menthol], Yellow dyes (non-tartrazine), and Latex    Review of Systems   Review of Systems  Musculoskeletal:  Positive for arthralgias.  All other systems reviewed and are negative.   Physical Exam Updated Vital Signs BP (!) 136/100 (BP Location: Left Arm)   Pulse 100   Temp 98.7 F (37.1 C)   Resp 16   Ht '5\' 9"'$  (1.753 m)   Wt 115.7 kg   LMP 10/05/2022 (Exact Date)   SpO2 100%   BMI 37.66 kg/m  Physical Exam Vitals and nursing note reviewed.  Constitutional:       General: She is not in acute distress.    Appearance: Normal appearance. She is well-developed. She is not ill-appearing, toxic-appearing or diaphoretic.  HENT:     Head: Normocephalic and atraumatic.  Eyes:     Conjunctiva/sclera: Conjunctivae normal.  Cardiovascular:     Rate and Rhythm: Normal rate and regular rhythm.     Heart sounds: No murmur heard. Pulmonary:     Effort: Pulmonary effort is normal. No respiratory distress.     Breath sounds: Normal breath sounds.  Abdominal:     Palpations: Abdomen is soft.     Tenderness: There is no abdominal tenderness.  Musculoskeletal:        General: No swelling.     Cervical back: Neck supple.     Right ankle: No swelling, deformity, ecchymosis or lacerations. Tenderness present. Decreased range of motion.     Left ankle: Normal.     Comments: Patient with right lateral ankle tenderness.  No obvious deformity, overlying skin change.  Patient does have intact range of motion actively.  Patient able to evert, invert, dorsiflex, plantarflex foot.  Patient does have significant issues with ambulation secondary to pain.  Skin:    General: Skin is warm and dry.     Capillary Refill: Capillary refill takes less than 2 seconds.  Neurological:     Mental Status: She is alert.  Psychiatric:        Mood and Affect: Mood normal.     ED Results / Procedures / Treatments   Labs (all labs ordered are listed, but only abnormal results are displayed) Labs Reviewed - No data to display  EKG None  Radiology DG Ankle Complete Right  Result Date: 10/26/2022 CLINICAL DATA:  Twisted ankle, pain EXAM: RIGHT ANKLE - COMPLETE 3+ VIEW COMPARISON:  None Available. FINDINGS: There is no evidence of fracture, dislocation, or joint effusion. There is no evidence of arthropathy or other focal bone abnormality. Mild diffuse soft tissue edema about the ankle. IMPRESSION: No fracture or dislocation of the right ankle. Mild diffuse soft tissue edema.  Electronically Signed   By: Delanna Ahmadi M.D.   On: 10/26/2022 16:16    Procedures Procedures   Medications Ordered in ED Medications - No data to display  ED Course/ Medical Decision Making/ A&P                           Medical Decision Making Amount and/or Complexity of Data Reviewed Radiology: ordered.   31 year old female presents ED for evaluation of right ankle injury.  Please see HPI for further details.  On my examination the patient is afebrile and nontachycardic.  The patient lung sounds are clear bilaterally, she is not  hypoxic.  The patient abdomen soft compressible throughout.  The patient right ankle has no obvious deformity, overlying skin change.  There is no obvious swelling.  Patient has intact range of motion actively.  Patient does have reduced ability to ambulate secondary to pain.  Patient imaging of right ankle does not show any acute fractures or subluxations.  Mild soft tissue swelling.  At this time, the patient will be placed in a cam boot and provided with crutches.  Patient will be referred to sports medicine for further management.  Patient encouraged to utilize RICE protocol.  Patient encouraged to take ibuprofen at home and apply ice.  Patient advised to remain in cam boot while ambulating.  Patient was offered a work note to take time off however deferred.  Return precautions provided, patient voiced understanding.  Patient stable for discharge  Final Clinical Impression(s) / ED Diagnoses Final diagnoses:  Injury of right ankle, initial encounter    Rx / DC Orders ED Discharge Orders     None         Azucena Cecil, PA-C 10/26/22 1712    Lajean Saver, MD 10/29/22 [phone]

## 2022-10-27 ENCOUNTER — Encounter: Payer: Self-pay | Admitting: Nurse Practitioner

## 2022-10-27 ENCOUNTER — Ambulatory Visit (INDEPENDENT_AMBULATORY_CARE_PROVIDER_SITE_OTHER): Payer: Medicaid Other | Admitting: Nurse Practitioner

## 2022-10-27 VITALS — BP 118/72 | HR 80 | Temp 98.5°F | Resp 20 | Ht 69.0 in | Wt 263.6 lb

## 2022-10-27 DIAGNOSIS — L210 Seborrhea capitis: Secondary | ICD-10-CM

## 2022-10-27 DIAGNOSIS — L858 Other specified epidermal thickening: Secondary | ICD-10-CM | POA: Diagnosis not present

## 2022-10-27 MED ORDER — KETOCONAZOLE 2 % EX CREA
1.0000 | TOPICAL_CREAM | Freq: Two times a day (BID) | CUTANEOUS | 3 refills | Status: DC
Start: 2022-10-27 — End: 2023-12-08

## 2022-10-27 MED ORDER — KETOCONAZOLE 2 % EX SHAM: 1.0000 | MEDICATED_SHAMPOO | CUTANEOUS | 6 refills | Status: DC

## 2022-10-27 NOTE — Patient Instructions (Signed)
Use the cream on your arms, legs, and buttocks twice a day mixed with lotion.   Use the shampoo on your head when you wash your hair for up to a month. Then you can cut down to every other wash.

## 2022-10-27 NOTE — Progress Notes (Signed)
  Orma Render, DNP, AGNP-c [facility] [address] [place], [place] [address] [phone]  Subjective:   Michele Avila is a 31 y.o. female presents to day for evaluation of: Bump on Buttocks Elana reports noticing a bump on her buttocks and she is concerned it is a dermatofibroma.  Dandruff [place] has concerns with dandruff that is not going away with OTC treatment.   PMH, Medications, and Allergies reviewed and updated in chart as appropriate.   ROS negative except for what is listed in HPI. Objective:  BP 118/72   Pulse 80   Temp 98.5 F (36.9 C) (Tympanic)   Resp 20   Ht '5\' 9"'$  (1.753 m)   Wt 263 lb 9.6 oz (119.6 kg)   LMP 10/05/2022 (Exact Date)   BMI 38.93 kg/m  Physical Exam General appearance: alert, cooperative, appears stated age, and no distress Lungs: clear to auscultation bilaterally Heart: regular rate and rhythm, S1, S2 normal, no murmur, click, rub or gallop Skin: temperature normal. eczema present to the scalp and papular keratoses present to the arm(s) bilateral, buttock(s) bilateral      Assessment & Plan:   Problem List Items Addressed This Visit     KP (keratosis pilaris) - Primary    Small papular rash on buttocks and upper arms consistent with keratosis pilaris. Patient reassured. Recommend washing skin with ketoconazole shampoo. May use benzoil peroxide cream to area to help with keratosis and skin turn over. No signs of infection present.       Relevant Medications   ketoconazole (NIZORAL) 2 % cream   Dandruff in adult    Dandruff present with moderate scaling of the scalp. Will treat with ketoconazole shampoo daily for one week then twice weekly for maintenance. F/U in no improvement.       Relevant Medications   ketoconazole (NIZORAL) 2 % shampoo      Orma Render, DNP, AGNP-c 11/11/2022  7:26 PM    History, Medications, Surgery, SDOH, and Family History reviewed and updated as appropriate.

## 2022-10-28 ENCOUNTER — Ambulatory Visit (INDEPENDENT_AMBULATORY_CARE_PROVIDER_SITE_OTHER): Payer: Medicaid Other | Admitting: Family Medicine

## 2022-10-28 ENCOUNTER — Ambulatory Visit: Payer: Self-pay

## 2022-10-28 ENCOUNTER — Encounter: Payer: Self-pay | Admitting: Family Medicine

## 2022-10-28 ENCOUNTER — Encounter: Payer: Self-pay | Admitting: Nurse Practitioner

## 2022-10-28 VITALS — BP 140/98 | Ht 69.0 in | Wt 260.0 lb

## 2022-10-28 DIAGNOSIS — S93491A Sprain of other ligament of right ankle, initial encounter: Secondary | ICD-10-CM | POA: Insufficient documentation

## 2022-10-28 DIAGNOSIS — M25571 Pain in right ankle and joints of right foot: Secondary | ICD-10-CM

## 2022-10-28 HISTORY — DX: Sprain of other ligament of right ankle, initial encounter: S93.491A

## 2022-10-28 NOTE — Patient Instructions (Signed)
Nice to meet you Please try ice  Please try the exercises  Please try the brace of boot for 2-3 weeks  Please send me a message in MyChart with any questions or updates.  Please see me back in 3-4 weeks.   --Dr. Raeford Razor

## 2022-10-28 NOTE — Progress Notes (Signed)
  Michele Avila - 31 y.o. female MRN [id number]  Date of birth: [birth year]  SUBJECTIVE:  Including CC & ROS.  No chief complaint on file.   Michele Avila is a 31 y.o. female that is presenting with right ankle pain.  She had an inversion injury.  Now she is having pain in the lateral anterior aspect of the ankle.  Does notice pain in the calf.  Has a history of bunion surgery.  Review of the emergency department note from 12/4 shows she was placed in a cam walker provided crutches. Independent review of the right ankle x-ray from 12/4 shows no acute changes.  Review of Systems See HPI   HISTORY: Past Medical, Surgical, Social, and Family History Reviewed & Updated per EMR.   Pertinent Historical Findings include:  Past Medical History:  Diagnosis Date   Anemia    Asthma    hx chronic bronchitis/exercise induced asthma as pre teen only   COVID 03/2021   no COVID vaccine, also had COVID in 2021   Environmental allergies    Infection    UTI   Migraine    Seasonal allergies    Wrist fracture, right     Past Surgical History:  Procedure Laterality Date   CESAREAN SECTION  2008   CESAREAN SECTION N/A 06/15/2013   Procedure: CESAREAN SECTION;  Surgeon: Allyn Kenner, DO;  Location: [facility];  Service: Obstetrics;  Laterality: N/A;   DILATION AND CURETTAGE OF UTERUS  08/2011   FOOT SURGERY  2017   INDUCED ABORTION       PHYSICAL EXAM:  VS: BP (!) 140/90 (BP Location: Left Arm, Patient Position: Sitting)   Ht '5\' 9"'$  (1.753 m)   Wt 260 lb (117.9 kg)   LMP 10/05/2022 (Exact Date)   BMI 38.40 kg/m  Physical Exam Gen: NAD, alert, cooperative with exam, well-appearing MSK:  Neurovascularly intact    Limited ultrasound: Right ankle pain:  No ankle effusion. No changes of the lateral or medial talar dome. Deltoid ligament appears to be intact.   There is mild effusion within the tendon sheath of the posterior tibialis. No changes of the base of the fifth  metatarsal.  Summary: Findings consistent with an ankle sprain  Ultrasound and interpretation by Clearance Coots, MD    ASSESSMENT & PLAN:   Sprain of anterior talofibular ligament of right ankle Acutely occurring.  Had an inversion injury with findings consistent with ankle sprain.  Swelling has improved today. -Counseled on home exercise therapy and supportive care. -Counseled on bracing and crutches. -Could consider physical therapy.

## 2022-10-28 NOTE — Assessment & Plan Note (Signed)
Acutely occurring.  Had an inversion injury with findings consistent with ankle sprain.  Swelling has improved today. -Counseled on home exercise therapy and supportive care. -Counseled on bracing and crutches. -Could consider physical therapy.

## 2022-10-29 NOTE — Telephone Encounter (Signed)
Called pt and scheduled an appt.

## 2022-10-30 ENCOUNTER — Ambulatory Visit (INDEPENDENT_AMBULATORY_CARE_PROVIDER_SITE_OTHER): Payer: Medicaid Other | Admitting: Nurse Practitioner

## 2022-10-30 VITALS — BP 128/80 | HR 92 | Ht 65.0 in | Wt 259.0 lb

## 2022-10-30 DIAGNOSIS — Z6838 Body mass index (BMI) 38.0-38.9, adult: Secondary | ICD-10-CM | POA: Diagnosis not present

## 2022-10-30 DIAGNOSIS — E8881 Metabolic syndrome: Secondary | ICD-10-CM

## 2022-10-30 DIAGNOSIS — E782 Mixed hyperlipidemia: Secondary | ICD-10-CM | POA: Diagnosis not present

## 2022-10-30 DIAGNOSIS — R7301 Impaired fasting glucose: Secondary | ICD-10-CM | POA: Diagnosis not present

## 2022-10-30 DIAGNOSIS — R7303 Prediabetes: Secondary | ICD-10-CM | POA: Diagnosis not present

## 2022-10-30 MED ORDER — WEGOVY 0.25 MG/0.5ML ~~LOC~~ SOAJ: 0.2500 mg | SUBCUTANEOUS | 0 refills | Status: DC

## 2022-10-30 MED ORDER — WEGOVY 0.5 MG/0.5ML ~~LOC~~ SOAJ: 0.5000 mg | SUBCUTANEOUS | 3 refills | Status: DC

## 2022-10-30 MED ORDER — PHENTERMINE HCL 37.5 MG PO TABS: 37.5000 mg | ORAL_TABLET | Freq: Every day | ORAL | 0 refills | Status: DC

## 2022-10-30 NOTE — Patient Instructions (Signed)
I want to check your sugar and we will recheck your kidneys today to make sure everything looks ok.  I will send in phentermine for you to try while we work to get your approval for wegovy. If we get approval we will have to work to find a place in stock, but it may be after the first of the year.    WEIGHT LOSS PLANNING Your progress today shows:     10/30/2022   10:48 AM 10/28/2022    9:13 AM 10/28/2022    8:33 AM  Vitals with BMI  Height '5\' 5"'$   '5\' 9"'$   Weight 259 lbs  260 lbs  BMI 26.8  34.19  Systolic [phone]  Diastolic 80 98 90  Pulse 92      For best management of weight, it is vital to balance intake versus output. This means the number of calories burned per day must be less than the calories you take in with food and drink.   I recommend trying to follow a diet with the following: Calories: 1200-1500 calories per day Carbohydrates: 150-180 grams of carbohydrates per day  Why: Gives your body enough "quick fuel" for cells to maintain normal function without sending them into starvation mode.  Protein: At least 45-55 grams of protein per day Why: Protein takes longer and uses more energy than carbohydrates to break down for fuel. The carbohydrates in your meals serves as quick energy sources and proteins help use some of that extra quick energy to break down to produce long term energy. This helps you not feel hungry as quickly and protein breakdown burns calories.  Water: Drink AT LEAST 64 ounces of water per day  Why: Water is essential to healthy metabolism. Water helps to fill the stomach and keep you fuller longer. Water is required for healthy digestion and filtering of waste in the body.  Fat: Limit fats in your diet- when choosing fats, choose foods with lower fats content such as lean meats (chicken, fish, [place]).  Why: Increased fat intake leads to storage "for later". Once you burn your carbohydrate energy, your body goes into fat and protein breakdown mode to help  you loose weight.  Cholesterol: Fats and oils that are LIQUID at room temperature are best. Choose vegetable oils (olive oil, avocado oil, nuts). Avoid fats that are SOLID at room temperature (animal fats, processed meats). Healthy fats are often found in whole grains, beans, nuts, seeds, and berries.  Why: Elevated cholesterol levels lead to build up of cholesterol on the inside of your blood vessels. This will eventually cause the blood vessels to become hard and can lead to high blood pressure and damage to your organs. When the blood flow is reduced, but the pressure is high from cholesterol buildup, parts of the cholesterol can break off and form clots that can go to the brain or heart leading to a stroke or heart attack.  Fiber: Increase amount of SOLUBLE the fiber in your diet. This helps to fill you up, lowers cholesterol, and helps with digestion. Some foods high in soluble fiber are oats, peas, beans, apples, carrots, barley, and citrus fruits.   Why: Fiber fills you up, helps remove excess cholesterol, and aids in healthy digestion which are all very important in weight management.   I recommend the following as a minimum activity routine: Purposeful walk or other physical activity at least 20 minutes every single day. This means purposefully taking a walk, jog, bike, swim, treadmill, elliptical,  dance, etc.  This activity should be ABOVE your normal daily activities, such as walking at work. Goal exercise should be at least 150 minutes a week- work your way up to this.   Heart Rate: Your maximum exercise heart rate should be 220 - Your Age in Years. When exercising, get your heart rate up, but avoid going over the maximum targeted heart rate.  60-70% of your maximum heart rate is where you tend to burn the most fat. To find this number:  220 - Age In Years= Max HR  Max HR x 0.6 (or 0.7) = Fat Burning HR The Fat Burning HR is your goal heart rate while working out to burn the most fat.   NEVER exercise to the point your feel lightheaded, weak, nauseated, dizzy. If you experience ANY of these symptoms- STOP exercise! Allow yourself to cool down and your heart rate to come down. Then restart slower next time.  If at [place] you feel chest pain or chest pressure during exercise, STOP IMMEDIATELY and seek medical attention.

## 2022-10-30 NOTE — Progress Notes (Signed)
Orma Render, DNP, AGNP-c [facility] [address] [place], [place] [address] [phone]  Subjective:   Michele Avila is a 31 y.o. female presents to day for evaluation of: Weight Gain [name] endorses concerns with continuous weight gain.  She is walking 10000 steps a day, drinking water, eats lots of salads made of spinach, she uses her air fryer, and has been eating lots of vegetables.  She endorses she does enjoy potatoes however she has been trying to cut back on these to help decrease the amount of carbohydrates that she takes in.  She has been monitoring her diet very closely to help with her weight management however she has been unsuccessful with her current efforts.  She is currently taking Wellbutrin however she has not noticed any difference in her weight management.  In the past she did use topiramate however this was not helpful at all.  She would like to know if there are any other options that may help her with weight management.  PMH, Medications, and Allergies reviewed and updated in chart as appropriate.   ROS negative except for what is listed in HPI. Objective:  BP 128/80 (BP Location: Right Arm, Patient Position: Sitting)   Pulse 92   Ht '5\' 5"'$  (1.651 m)   Wt 259 lb (117.5 kg)   LMP 10/05/2022 (Exact Date)   SpO2 99%   BMI 43.10 kg/m  Physical Exam Vitals and nursing note reviewed.  Constitutional:      Appearance: She is obese.  HENT:     Head: Normocephalic.  Eyes:     Extraocular Movements: Extraocular movements intact.     Pupils: Pupils are equal, round, and reactive to light.  Cardiovascular:     Rate and Rhythm: Normal rate and regular rhythm.     Pulses: Normal pulses.     Heart sounds: Normal heart sounds.  Pulmonary:     Effort: Pulmonary effort is normal.     Breath sounds: Normal breath sounds.  Abdominal:     General: Bowel sounds are normal.     Palpations: Abdomen is soft.  Musculoskeletal:     Cervical back: Normal  range of motion.  Skin:    General: Skin is warm and dry.     Capillary Refill: Capillary refill takes less than 2 seconds.  Neurological:     General: No focal deficit present.     Mental Status: She is alert and oriented to person, place, and time.           Assessment & Plan:   Problem List Items Addressed This Visit     BMI 38.0-38.9,adult    Weight management discussion with patient today including medications that may be helpful.  Recommendations for diet and exercise discussed and provided on handout.  We will obtain labs for monitoring.  At this time recommend trial of phentermine to see if this is helpful for joint starting weight loss and will add Wegovy with plan to start in January if supply improves.  Recommend not starting medication until complete month of 0.25 and complete month of 0.5 mg medications are on hand to avoid starting and stopping.  We will plan to follow-up in approximately 3 months to see how she is doing.  Patient educated on the importance of stopping the medication immediately if she notices increased anxiety, palpitations, or other symptoms.      Relevant Medications   phentermine (ADIPEX-P) 37.5 MG tablet   Semaglutide-Weight Management (WEGOVY) 0.25 MG/0.5ML SOAJ  Semaglutide-Weight Management (WEGOVY) 0.5 MG/0.5ML SOAJ (Start on 11/24/2022)   Other Relevant Orders   Hemoglobin A1c (Completed)   Comprehensive metabolic panel (Completed)   Mixed hyperlipidemia   Relevant Medications   phentermine (ADIPEX-P) 37.5 MG tablet   Semaglutide-Weight Management (WEGOVY) 0.25 MG/0.5ML SOAJ   Semaglutide-Weight Management (WEGOVY) 0.5 MG/0.5ML SOAJ (Start on 11/24/2022)   Other Relevant Orders   Hemoglobin A1c (Completed)   Comprehensive metabolic panel (Completed)   Pre-diabetes   Metabolic syndrome X - Primary   Relevant Medications   phentermine (ADIPEX-P) 37.5 MG tablet   Semaglutide-Weight Management (WEGOVY) 0.25 MG/0.5ML SOAJ   Semaglutide-Weight  Management (WEGOVY) 0.5 MG/0.5ML SOAJ (Start on 11/24/2022)   Other Relevant Orders   Hemoglobin A1c (Completed)   Comprehensive metabolic panel (Completed)      Orma Render, DNP, AGNP-c 11/17/2022  5:22 PM    History, Medications, Surgery, SDOH, and Family History reviewed and updated as appropriate.

## 2022-10-31 LAB — HEMOGLOBIN A1C
Est. average glucose Bld gHb Est-mCnc: 120 mg/dL
Hgb A1c MFr Bld: 5.8 % — ABNORMAL HIGH (ref 4.8–5.6)

## 2022-10-31 LAB — COMPREHENSIVE METABOLIC PANEL
ALT: 14 IU/L (ref 0–32)
AST: 18 IU/L (ref 0–40)
Albumin/Globulin Ratio: 2.1 (ref 1.2–2.2)
Albumin: 4.6 g/dL (ref 4.0–5.0)
Alkaline Phosphatase: 79 IU/L (ref 44–121)
BUN/Creatinine Ratio: 12 (ref 9–23)
BUN: 9 mg/dL (ref 6–20)
Bilirubin Total: 0.4 mg/dL (ref 0.0–1.2)
CO2: 23 mmol/L (ref 20–29)
Calcium: 9.4 mg/dL (ref 8.7–10.2)
Chloride: 100 mmol/L (ref 96–106)
Creatinine, Ser: 0.74 mg/dL (ref 0.57–1.00)
Globulin, Total: 2.2 g/dL (ref 1.5–4.5)
Glucose: 97 mg/dL (ref 70–99)
Potassium: 4.5 mmol/L (ref 3.5–5.2)
Sodium: 137 mmol/L (ref 134–144)
Total Protein: 6.8 g/dL (ref 6.0–8.5)
eGFR: 112 mL/min/{1.73_m2} (ref >59)

## 2022-11-09 ENCOUNTER — Ambulatory Visit: Payer: Medicaid Other | Admitting: Neurology

## 2022-11-10 ENCOUNTER — Other Ambulatory Visit (HOSPITAL_COMMUNITY): Payer: Self-pay

## 2022-11-11 DIAGNOSIS — L210 Seborrhea capitis: Secondary | ICD-10-CM | POA: Insufficient documentation

## 2022-11-11 DIAGNOSIS — L858 Other specified epidermal thickening: Secondary | ICD-10-CM | POA: Insufficient documentation

## 2022-11-11 NOTE — Assessment & Plan Note (Signed)
Dandruff present with moderate scaling of the scalp. Will treat with ketoconazole shampoo daily for one week then twice weekly for maintenance. F/U in no improvement.

## 2022-11-11 NOTE — Assessment & Plan Note (Signed)
Small papular rash on buttocks and upper arms consistent with keratosis pilaris. Patient reassured. Recommend washing skin with ketoconazole shampoo. May use benzoil peroxide cream to area to help with keratosis and skin turn over. No signs of infection present.

## 2022-11-13 ENCOUNTER — Telehealth (INDEPENDENT_AMBULATORY_CARE_PROVIDER_SITE_OTHER): Payer: Medicaid Other | Admitting: Nurse Practitioner

## 2022-11-13 ENCOUNTER — Encounter: Payer: Self-pay | Admitting: Nurse Practitioner

## 2022-11-13 VITALS — Temp 102.7°F | Ht 69.0 in | Wt 260.0 lb

## 2022-11-13 DIAGNOSIS — J111 Influenza due to unidentified influenza virus with other respiratory manifestations: Secondary | ICD-10-CM

## 2022-11-13 MED ORDER — IBUPROFEN 800 MG PO TABS: 800.0000 mg | ORAL_TABLET | Freq: Three times a day (TID) | ORAL | 0 refills | Status: DC | PRN

## 2022-11-13 MED ORDER — PROMETHAZINE HCL 25 MG PO TABS: 25.0000 mg | ORAL_TABLET | Freq: Four times a day (QID) | ORAL | 2 refills | Status: DC | PRN

## 2022-11-13 MED ORDER — BENZONATATE 200 MG PO CAPS: 200.0000 mg | ORAL_CAPSULE | Freq: Three times a day (TID) | ORAL | 0 refills | Status: DC | PRN

## 2022-11-13 MED ORDER — OSELTAMIVIR PHOSPHATE 75 MG PO CAPS: 75.0000 mg | ORAL_CAPSULE | Freq: Two times a day (BID) | ORAL | 0 refills | Status: DC

## 2022-11-13 NOTE — Patient Instructions (Addendum)
Stay hydrated- water, gatorade, broth, etc. Stay away from dairy products because this can make your cough worse.   Start with a shot glass size and drink this every 30 minutes. If you can hold it down, every hour increase the amount by 1 shot glass until you are drinking 3 shot glasses full every 30 minutes.   Your fever is using a lot of fluid, so dehydration can be dangerous with the flu. Do everything you can to avoid going to the hospital because the emergency rooms are absolutely crazy right now and I am worried about you getting exposed to something else on top of this.  If you are not able to hold anything down at all, then you will need to go get IV fluids. I want you to be peeing at least every 12 hours.   Keep the phenergan in your system as much as possible to keep you from getting sick with the medications.   Take '800mg'$  ibuprofen every 8 hours and 1000-'1300mg'$  of Tylenol every 8 hours alternating (so you will take one of the medicines every 4 hours). Do this until your fever is gone.  Eat a small cracker before you take the ibuprofen so you don't upset your stomach.   The key things: DRINK and REST   I hope you get better very soon!!

## 2022-11-13 NOTE — Progress Notes (Signed)
Virtual Visit Encounter mychart visit.   I connected with  Michele Avila on 11/18/22 at  8:15 AM EST by secure video and audio telemedicine application. I verified that I am speaking with the correct person using two identifiers.   I introduced myself as a Designer, jewellery with the practice. The limitations of evaluation and management by telemedicine discussed with the patient and the availability of in person appointments. The patient expressed verbal understanding and consent to proceed.  Participating parties in this visit include: Myself and patient  The patient is: Patient Location: Home I am: Provider Location: Office/Clinic Subjective:    CC and HPI: Michele Avila is a 31 y.o. year old female presenting for new evaluation and treatment of Flu-like symptoms. Patient reports the following: Michele Avila started with nausea, vomiting, cough, fevers, body aches, malaise, and fatigue on Wednesday night. She has been taking '400mg'$  of ibuprofen and '1300mg'$  of tylenol alternating every 8 hours. Her fevers are 102.7 with medication. She has not been able to keep food or liquids down. She last urinated at 0400. She has no known specific sick contacts.   Past medical history, Surgical history, Family history not pertinant except as noted below, Social history, Allergies, and medications have been entered into the medical record, reviewed, and corrections made.   Review of Systems:  All review of systems negative except what is listed in the HPI  Objective:    Alert and oriented x 4 Ill appearing. Audibly congested. Cough present.  Speaking in clear sentences with no shortness of breath. No distress.  Impression and Recommendations:    Problem List Items Addressed This Visit     Influenza - Primary    Suspected influenza illness given her symptoms present. At this time do not feel that testing is appropriate as this would delay treatment. Will send tamiflu in for patient and supportive  treatment options to pharmacy for symptoms. Recommend rest and push fluids. F/U if no improvement of symptoms at completion of treatment.       Relevant Medications   promethazine (PHENERGAN) 25 MG tablet   ibuprofen (ADVIL) 800 MG tablet   oseltamivir (TAMIFLU) 75 MG capsule   benzonatate (TESSALON) 200 MG capsule    orders and follow up as documented in EMR I discussed the assessment and treatment plan with the patient. The patient was provided an opportunity to ask questions and all were answered. The patient agreed with the plan and demonstrated an understanding of the instructions.   The patient was advised to call back or seek an in-person evaluation if the symptoms worsen or if the condition fails to improve as anticipated.  Follow-Up: If no improvement with completion of treatment Seek emergency room evaluation if fevers of 103 with medication every 4 hours and unable to keep liquids down  I provided 17 minutes of non-face-to-face interaction with this non face-to-face encounter including intake, same-day documentation, and chart review.   Orma Render, NP , DNP, AGNP-c [place] [facility] [phone] (fax)

## 2022-11-17 NOTE — Assessment & Plan Note (Signed)
Weight management discussion with patient today including medications that may be helpful.  Recommendations for diet and exercise discussed and provided on handout.  We will obtain labs for monitoring.  At this time recommend trial of phentermine to see if this is helpful for joint starting weight loss and will add Wegovy with plan to start in January if supply improves.  Recommend not starting medication until complete month of 0.25 and complete month of 0.5 mg medications are on hand to avoid starting and stopping.  We will plan to follow-up in approximately 3 months to see how she is doing.  Patient educated on the importance of stopping the medication immediately if she notices increased anxiety, palpitations, or other symptoms.

## 2022-11-18 ENCOUNTER — Encounter: Payer: Self-pay | Admitting: Nurse Practitioner

## 2022-11-18 DIAGNOSIS — J111 Influenza due to unidentified influenza virus with other respiratory manifestations: Secondary | ICD-10-CM | POA: Insufficient documentation

## 2022-11-18 NOTE — Assessment & Plan Note (Signed)
Suspected influenza illness given her symptoms present. At this time do not feel that testing is appropriate as this would delay treatment. Will send tamiflu in for patient and supportive treatment options to pharmacy for symptoms. Recommend rest and push fluids. F/U if no improvement of symptoms at completion of treatment.

## 2022-11-20 ENCOUNTER — Telehealth: Payer: Self-pay

## 2022-11-20 ENCOUNTER — Other Ambulatory Visit (HOSPITAL_COMMUNITY): Payer: Self-pay

## 2022-11-20 NOTE — Telephone Encounter (Signed)
Pharmacy Patient Advocate Encounter   Received notification from CoverMyMeds that prior authorization for Emgality '120MG'$ /ML auto-injectors (migraine) is required/requested.    PA submitted on 11/20/2022 to (ins) [employer] via CoverMyMeds Key [id number] Status is pending

## 2022-11-25 ENCOUNTER — Ambulatory Visit: Payer: Medicaid Other | Admitting: Family Medicine

## 2022-11-27 NOTE — Telephone Encounter (Signed)
Pharmacy Patient Advocate Encounter  Received notification from [employer] that the request for prior authorization for Emgality '120MG'$ /ML auto-injectors (migraine) has been denied due to See Below.       How would you like to proceed?  Please be advised appeals may take up to 5 business days to be submitted as pharmacist prepares necessary documentation.  Thank you!

## 2022-11-30 ENCOUNTER — Telehealth: Payer: Self-pay | Admitting: Anesthesiology

## 2022-11-30 NOTE — Telephone Encounter (Signed)
Pt left message stating her Emgality was denied by her insurance due to her not being monitored for pregnancy, she would like to know what she needs to do to be able to get her medication.

## 2022-12-01 ENCOUNTER — Encounter: Payer: Self-pay | Admitting: Neurology

## 2022-12-01 NOTE — Telephone Encounter (Signed)
Pt phone was off and and she left a VM stating that it is back on sop please call her back

## 2022-12-01 NOTE — Telephone Encounter (Signed)
LMOVM for patient to give the office a call. Are you Pregnant or the insurance thinks you are?

## 2022-12-02 ENCOUNTER — Other Ambulatory Visit: Payer: Self-pay

## 2022-12-02 DIAGNOSIS — R29818 Other symptoms and signs involving the nervous system: Secondary | ICD-10-CM

## 2022-12-02 DIAGNOSIS — Z79899 Other long term (current) drug therapy: Secondary | ICD-10-CM

## 2022-12-03 ENCOUNTER — Encounter: Payer: Self-pay | Admitting: Nurse Practitioner

## 2022-12-03 ENCOUNTER — Ambulatory Visit (INDEPENDENT_AMBULATORY_CARE_PROVIDER_SITE_OTHER): Payer: Medicaid Other | Admitting: Nurse Practitioner

## 2022-12-03 VITALS — BP 122/80 | HR 74 | Temp 98.3°F | Wt 252.6 lb

## 2022-12-03 DIAGNOSIS — Z79899 Other long term (current) drug therapy: Secondary | ICD-10-CM | POA: Diagnosis not present

## 2022-12-03 LAB — POCT URINE PREGNANCY: Preg Test, Ur: NEGATIVE

## 2022-12-04 NOTE — Progress Notes (Unsigned)
Pt. Came in for a pregnancy test per her ins. To cover her medication.   I have reviewed this encounter including the documentation in this note and/or discussed this patient with the [facility]. All orders and medical decision making have been approved by the supervising primary care provider.   I am certifying that I agree with the content of this note as supervising primary care provider.   Orma Render, DNP, AGNP-c

## 2022-12-07 NOTE — Progress Notes (Signed)
Pt. Came in for a pregnancy test per her ins. To cover her medication.   I have reviewed this encounter including the documentation in this note and/or discussed this patient with the [facility]. All orders and medical decision making have been approved by the supervising primary care provider.   I am certifying that I agree with the content of this note as supervising primary care provider.   Orma Render, DNP, AGNP-c

## 2022-12-15 ENCOUNTER — Other Ambulatory Visit (HOSPITAL_COMMUNITY): Payer: Self-pay
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
## 2022-12-18 ENCOUNTER — Telehealth: Payer: Self-pay | Admitting: *Deleted

## 2022-12-21 ENCOUNTER — Ambulatory Visit: Payer: Medicaid Other | Admitting: Neurology

## 2022-12-21 ENCOUNTER — Encounter: Payer: Self-pay | Admitting: Neurology

## 2022-12-21 VITALS — BP 123/77 | HR 98 | Ht 69.0 in | Wt 245.6 lb

## 2022-12-21 DIAGNOSIS — G43109 Migraine with aura, not intractable, without status migrainosus: Secondary | ICD-10-CM

## 2022-12-23 ENCOUNTER — Other Ambulatory Visit (HOSPITAL_COMMUNITY): Payer: Self-pay

## 2022-12-29 ENCOUNTER — Telehealth: Payer: Self-pay

## 2022-12-29 ENCOUNTER — Other Ambulatory Visit (HOSPITAL_COMMUNITY): Payer: Self-pay

## 2022-12-29 NOTE — Telephone Encounter (Signed)
PA request received via CMM for Emgality '120MG'$ /ML auto-injectors (migraine)  PA has been submitted to [facility] and is pending determination.  Key: [facility]

## 2023-01-21 NOTE — Telephone Encounter (Signed)
Patient Advocate Encounter  Prior Authorization for Emgality '120MG'$ /ML auto-injectors (migraine) has been approved.    PA# [id number] Insurance [employer] Electronic Prior Authorization Form Effective dates: 12/29/2022 through 12/30/2023      Lyndel Safe, [place] Patient Advocate Specialist [place] Patient Advocate Team Direct Number: [phone]  Fax: [phone]

## 2023-02-10 ENCOUNTER — Other Ambulatory Visit: Payer: Self-pay | Admitting: Nurse Practitioner

## 2023-02-10 DIAGNOSIS — E782 Mixed hyperlipidemia: Secondary | ICD-10-CM

## 2023-02-10 DIAGNOSIS — E8881 Metabolic syndrome: Secondary | ICD-10-CM

## 2023-02-10 DIAGNOSIS — Z6838 Body mass index (BMI) 38.0-38.9, adult: Secondary | ICD-10-CM

## 2023-02-11 NOTE — Telephone Encounter (Signed)
Refill request last apt 12/03/22.

## 2023-02-12 ENCOUNTER — Telehealth: Payer: Self-pay | Admitting: Nurse Practitioner

## 2023-02-12 NOTE — Telephone Encounter (Signed)
Refill has been sent. Please let her know that after this refill she will have completed the maximum allowable time for treatment as we are limited to 6 months given that this is a controlled substance.

## 2023-02-12 NOTE — Telephone Encounter (Signed)
Tinzlee called and states she is out of her phentermine and is feeling miserable without it and is asking for a refill to [facility], [address] AT [facility]

## 2023-02-18 ENCOUNTER — Encounter: Payer: Self-pay | Admitting: Nurse Practitioner

## 2023-02-18 DIAGNOSIS — R41840 Attention and concentration deficit: Secondary | ICD-10-CM

## 2023-02-24 ENCOUNTER — Encounter (HOSPITAL_COMMUNITY): Payer: Self-pay

## 2023-02-24 ENCOUNTER — Ambulatory Visit (HOSPITAL_COMMUNITY)
Admission: EM | Admit: 2023-02-24 | Discharge: 2023-02-24 | Disposition: A | Discharge status: Home / Self Care | Payer: Medicaid Other | Attending: Family | Admitting: Family

## 2023-02-24 DIAGNOSIS — H938X3 Other specified disorders of ear, bilateral: Secondary | ICD-10-CM | POA: Diagnosis not present

## 2023-02-24 DIAGNOSIS — R051 Acute cough: Secondary | ICD-10-CM | POA: Diagnosis not present

## 2023-02-24 DIAGNOSIS — J9801 Acute bronchospasm: Secondary | ICD-10-CM | POA: Diagnosis not present

## 2023-02-24 MED ORDER — GUAIFENESIN-CODEINE 100-10 MG/5ML PO SYRP: 5.0000 mL | ORAL_SOLUTION | Freq: Three times a day (TID) | ORAL | 0 refills | Status: DC | PRN

## 2023-02-24 MED ORDER — PREDNISONE 20 MG PO TABS: 40.0000 mg | ORAL_TABLET | Freq: Every day | ORAL | 0 refills | Status: AC

## 2023-02-24 MED ORDER — ALBUTEROL SULFATE HFA 108 (90 BASE) MCG/ACT IN AERS: 2.0000 | INHALATION_SPRAY | RESPIRATORY_TRACT | 0 refills | Status: DC | PRN

## 2023-02-24 NOTE — ED Provider Notes (Signed)
[place]    CSN: [id number] Arrival date & time: 02/24/23  1104      History   Chief Complaint Chief Complaint  Patient presents with   Cough    Ears clogged,  chest hurts,  coughing hurts, - Entered by patient    HPI Catasha Banwart is a 32 y.o. female.   32 year old female presents with cough that started yesterday.  Also having some nasal drainage, plugged ears with pressure and sinus pressure. Denies any fever. Chest hurts from coughing. Non-productive.   The history is provided by the patient.  Cough   Past Medical History:  Diagnosis Date   Anemia    Asthma    hx chronic bronchitis/exercise induced asthma as pre teen only   COVID 03/2021   no COVID vaccine, also had COVID in 2021   Encounter for medical examination to establish care 04/24/2022   Environmental allergies    Infection    UTI   Migraine    Nodule of skin of lower extremity 05/14/2022   Recent skin changes 04/24/2022   Seasonal allergies    Smoking trying to quit 04/23/2022   Sprain of anterior talofibular ligament of right ankle 10/28/2022   Wrist fracture, right     Patient Active Problem List   Diagnosis Date Noted   Influenza 11/18/2022   KP (keratosis pilaris) 11/11/2022   Dandruff in adult 11/11/2022   BMI 38.0-38.9,adult 10/30/2022   Mixed hyperlipidemia 10/30/2022   Pre-diabetes 10/30/2022   Panic 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   Dermatofibroma 08/10/2022   Acute left-sided low back pain without sciatica 06/11/2022   Elevated blood pressure reading [id number]   Metabolic syndrome X [id number]   Intractable migraine without aura and with status migrainosus 04/23/2022   Bipolar affective disorder, currently depressed, moderate 04/23/2022    Past Surgical History:  Procedure Laterality Date   CESAREAN SECTION  2008   CESAREAN SECTION N/A 06/15/2013   Procedure: CESAREAN SECTION;  Surgeon: Allyn Kenner, DO;  Location: [facility];  Service: Obstetrics;  Laterality: N/A;    DILATION AND CURETTAGE OF UTERUS  08/2011   FOOT SURGERY  2017   INDUCED ABORTION      OB History     Gravida  5   Para  2   Term  2   Preterm      AB  3   Living  2      SAB      IAB  2   Ectopic      Multiple      Live Births  1            Home Medications    Prior to Admission medications   Medication Sig Start Date End Date Taking? Authorizing Provider  albuterol (VENTOLIN HFA) 108 (90 Base) MCG/ACT inhaler Inhale 2 puffs into the lungs every 4 (four) hours as needed for wheezing or shortness of breath (or cough). 02/24/23  Yes Shali Vesey, Nicholes Stairs, NP  guaiFENesin-codeine (ROBITUSSIN AC) 100-10 MG/5ML syrup Take 5 mLs by mouth 3 (three) times daily as needed for cough. 02/24/23  Yes Trenita Hulme, Nicholes Stairs, NP  predniSONE (DELTASONE) 20 MG tablet Take 2 tablets (40 mg total) by mouth daily for 5 days. 02/24/23 03/01/23 Yes Tyger Oka, Nicholes Stairs, NP  benzonatate (TESSALON) 200 MG capsule Take 1 capsule (200 mg total) by mouth 3 (three) times daily as needed for cough. 11/13/22   Orma Render, NP  buPROPion (WELLBUTRIN XL) 150 MG 24 hr  tablet Take 150 mg by mouth 2 (two) times daily.    [provider]  EPINEPHrine 0.3 mg/0.3 mL IJ SOAJ injection Inject 0.3 mg into the muscle as needed for anaphylaxis. 08/24/22   Montine Circle, PA-C  ibuprofen (ADVIL) 800 MG tablet Take 1 tablet (800 mg total) by mouth every 8 (eight) hours as needed for fever. Alternate with Tylenol 11/13/22   Early, Coralee Pesa, NP  ketoconazole (NIZORAL) 2 % cream Apply 1 Application topically 2 (two) times daily. To affected areas. 10/27/22   Orma Render, NP  ketoconazole (NIZORAL) 2 % shampoo Apply 1 Application topically 2 (two) times a week. 10/29/22   Orma Render, NP  phentermine (ADIPEX-P) 37.5 MG tablet TAKE 1 TABLET(37.5 MG) BY MOUTH DAILY BEFORE BREAKFAST 02/12/23   Early, Coralee Pesa, NP  promethazine (PHENERGAN) 25 MG tablet Take 1 tablet (25 mg total) by mouth every 6 (six) hours as needed for nausea  or vomiting. 11/13/22   Early, Coralee Pesa, NP  Semaglutide-Weight Management (WEGOVY) 0.25 MG/0.5ML SOAJ Inject 0.25 mg into the skin once a week. 10/30/22   Orma Render, NP  Semaglutide-Weight Management (WEGOVY) 0.5 MG/0.5ML SOAJ Inject 0.5 mg into the skin once a week. 11/24/22   Orma Render, NP  Vitamin D, Ergocalciferol, (DRISDOL) 1.25 MG (50000 UNIT) CAPS capsule TAKE ONE CAPSULE BY MOUTH EVERY 7 DAYS FOR 12 WEEKS THEN TRANSITION TO DAILY OTC SUPPLEMENT 10/17/22   Early, Coralee Pesa, NP  esomeprazole (NEXIUM) 40 MG capsule Take 1 capsule (40 mg total) by mouth daily. Patient not taking: Reported on 09/16/2019 07/12/19 10/12/19  Vanessa Kick, MD  loratadine (CLARITIN) 10 MG tablet Take 1 tablet (10 mg total) by mouth daily. Patient not taking: Reported on 12/03/2019 10/12/19 01/16/20  Janith Lima, PA-C    Family History Family History  Problem Relation Age of Onset   Cancer Father        liver   Diabetes Father    Hypertension Father    Hypertension Mother    Hypertension Maternal Grandmother    Kidney disease Maternal Grandmother    Diabetes Paternal Grandmother    Hypertension Paternal Grandmother    Ulcers Brother    Crohn's disease Brother     Social History Social History   Tobacco Use   Smoking status: Every Day    Packs/day: .15    Types: Cigarettes   Smokeless tobacco: Never   Tobacco comments:    down to 5 cigarrettes/day  Vaping Use   Vaping Use: Never used  Substance Use Topics   Alcohol use: Yes    Comment: occassionally    Drug use: Yes    Types: Marijuana     Allergies   Camphor-menthol, Methyl salicylate, [name] [camphor-menthol], Yellow dyes (non-tartrazine), and Latex   Review of Systems Review of Systems  Respiratory:  Positive for cough.      Physical Exam Triage Vital Signs ED Triage Vitals  Enc Vitals Group     BP 02/24/23 1248 (!) 126/92     Pulse Rate 02/24/23 1248 86     Resp 02/24/23 1248 16     Temp 02/24/23 1248 98.5 F  (36.9 C)     Temp Source 02/24/23 1248 Oral     SpO2 02/24/23 1248 98 %     Weight --      Height --      Head Circumference --      Peak Flow --  Pain Score 02/24/23 1250 0     Pain Loc --      Pain Edu? --      Excl. in [place]? --    No data found.  Updated Vital Signs BP (!) 126/92 (BP Location: Left Arm)   Pulse 86   Temp 98.5 F (36.9 C) (Oral)   Resp 16   LMP 02/10/2023   SpO2 98%   Visual Acuity Right Eye Distance:   Left Eye Distance:   Bilateral Distance:    Right Eye Near:   Left Eye Near:    Bilateral Near:     Physical Exam Vitals and nursing note reviewed.  HENT:     Head: Normocephalic and atraumatic.     Right Ear: Tympanic membrane is bulging. Tympanic membrane is not injected or erythematous.     Left Ear: Tympanic membrane is bulging. Tympanic membrane is not injected or erythematous.  Cardiovascular:     Rate and Rhythm: Normal rate and regular rhythm.     Heart sounds: Normal heart sounds. No murmur heard. Pulmonary:     Effort: Pulmonary effort is normal. No respiratory distress.  Neurological:     Mental Status: She is alert.      UC Treatments / Results  Labs (all labs ordered are listed, but only abnormal results are displayed) Labs Reviewed - No data to display  EKG   Radiology No results found.  Procedures Procedures (including critical care time)  Medications Ordered in UC Medications - No data to display  Initial Impression / Assessment and Plan / UC Course  I have reviewed the triage vital signs and the nursing notes.  Pertinent labs & imaging results that were available during my care of the patient were reviewed by me and considered in my medical decision making (see chart for details).     *** Final Clinical Impressions(s) / UC Diagnoses   Final diagnoses:  Acute cough  Bronchospasm, acute  Ear pressure, bilateral     Discharge Instructions      Recommend start Prednisone 40mg  daily with food for  the next 5 days. Use Albuterol inhaler 2 puffs every 4 to 6 hours as needed for cough or chest tightness. Continue to push fluids to loosen up mucus in sinuses. May also take Robitussin with Codeine cough medication 1 teaspoon every 8 hours as needed. Follow-up in 3 to 4 days if not improving or sooner if worsening.     ED Prescriptions     Medication Sig Dispense Auth. Provider   predniSONE (DELTASONE) 20 MG tablet Take 2 tablets (40 mg total) by mouth daily for 5 days. 10 tablet Katy Apo, NP   albuterol (VENTOLIN HFA) 108 (90 Base) MCG/ACT inhaler Inhale 2 puffs into the lungs every 4 (four) hours as needed for wheezing or shortness of breath (or cough). 18 g Katy Apo, NP   guaiFENesin-codeine [facility]) 100-10 MG/5ML syrup Take 5 mLs by mouth 3 (three) times daily as needed for cough. 60 mL Katy Apo, NP      I have reviewed the PDMP during this encounter.

## 2023-02-24 NOTE — Discharge Instructions (Signed)
Recommend start Prednisone 40mg  daily with food for the next 5 days. Use Albuterol inhaler 2 puffs every 4 to 6 hours as needed for cough or chest tightness. Continue to push fluids to loosen up mucus in sinuses. May also take Robitussin with Codeine cough medication 1 teaspoon every 8 hours as needed. Follow-up in 3 to 4 days if not improving or sooner if worsening.

## 2023-02-24 NOTE — ED Triage Notes (Signed)
Patient c/o a non productive cough and feeling like ears are clogged since yesterday.  Patient reports that she has taken Mucinex, robitussin, and an old prescription of Tessalon Perles without relief.

## 2023-03-03 ENCOUNTER — Encounter: Payer: Self-pay | Admitting: Psychology

## 2023-03-06 ENCOUNTER — Encounter: Payer: Self-pay | Admitting: Nurse Practitioner

## 2023-03-08 ENCOUNTER — Encounter: Payer: Self-pay | Admitting: *Deleted

## 2023-04-16 ENCOUNTER — Telehealth: Payer: Self-pay | Admitting: Neurology

## 2023-04-16 NOTE — Telephone Encounter (Signed)
New message   Prior authorization Nurtec 75 mg   [id number] - , [place] - [address] AT [facility] OF [address] & [address]

## 2023-04-21 ENCOUNTER — Telehealth: Payer: Self-pay | Admitting: Pharmacy Technician

## 2023-04-21 NOTE — Telephone Encounter (Signed)
Patient Advocate Encounter   Received notification that prior authorization for Nurtec 75MG  dispersible tablets is required.   PA submitted on 04/21/2023 [id number] Electronic Prior Authorization Form Status is pending

## 2023-04-21 NOTE — Telephone Encounter (Signed)
Status of PA in separate encounter 

## 2023-04-21 NOTE — Telephone Encounter (Signed)
Patient Advocate Encounter  Prior Authorization for Nurtec 75MG  dispersible tablets has been approved through [employer].    Townsend Roger   Effective: 04-21-2023 to 04-20-2024
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
## 2023-05-24 ENCOUNTER — Other Ambulatory Visit: Payer: Self-pay | Admitting: Neurology

## 2023-05-24 ENCOUNTER — Ambulatory Visit: Payer: Medicaid Other | Admitting: Neurology

## 2023-05-24 ENCOUNTER — Encounter: Payer: Self-pay | Admitting: Neurology

## 2023-05-24 VITALS — BP 130/74 | HR 66 | Ht 70.0 in | Wt 245.0 lb

## 2023-05-24 DIAGNOSIS — G43109 Migraine with aura, not intractable, without status migrainosus: Secondary | ICD-10-CM

## 2023-05-24 MED ORDER — NURTEC 75 MG PO TBDP: 75.0000 mg | ORAL_TABLET | Freq: Every day | ORAL | 5 refills | Status: DC | PRN

## 2023-05-24 MED ORDER — EMGALITY 120 MG/ML ~~LOC~~ SOAJ: 120.0000 mg | SUBCUTANEOUS | 11 refills | Status: DC

## 2023-05-24 MED ORDER — EMGALITY 120 MG/ML ~~LOC~~ SOAJ: 240.0000 mg | Freq: Once | SUBCUTANEOUS | 0 refills | Status: DC

## 2023-07-13 ENCOUNTER — Telehealth: Payer: Self-pay

## 2023-07-13 NOTE — Telephone Encounter (Signed)
Pt scheduled for 08/05/23

## 2023-07-13 NOTE — Telephone Encounter (Signed)
Refill request faxed for bupropion SR 150 MG tablets

## 2023-07-15 ENCOUNTER — Telehealth: Payer: Self-pay

## 2023-07-15 DIAGNOSIS — F3132 Bipolar disorder, current episode depressed, moderate: Secondary | ICD-10-CM

## 2023-07-15 NOTE — Telephone Encounter (Signed)
Fax came in requesting bupropion SR 150 MG Tablets

## 2023-07-16 MED ORDER — BUPROPION HCL ER (SR) 150 MG PO TB12
150.0000 mg | ORAL_TABLET | Freq: Two times a day (BID) | ORAL | 1 refills | Status: DC
Start: 2023-07-16 — End: 2023-08-05

## 2023-07-16 MED ORDER — BUPROPION HCL ER (XL) 150 MG PO TB24: 150.0000 mg | ORAL_TABLET | Freq: Two times a day (BID) | ORAL | 1 refills | Status: DC

## 2023-08-05 ENCOUNTER — Encounter: Payer: Self-pay | Admitting: Nurse Practitioner

## 2023-08-05 ENCOUNTER — Ambulatory Visit: Payer: Medicaid Other | Admitting: Nurse Practitioner

## 2023-08-05 VITALS — BP 130/82 | HR 78 | Wt 252.4 lb

## 2023-08-05 DIAGNOSIS — F3132 Bipolar disorder, current episode depressed, moderate: Secondary | ICD-10-CM

## 2023-08-05 DIAGNOSIS — E559 Vitamin D deficiency, unspecified: Secondary | ICD-10-CM | POA: Diagnosis not present

## 2023-08-05 DIAGNOSIS — E8881 Metabolic syndrome: Secondary | ICD-10-CM

## 2023-08-05 DIAGNOSIS — R030 Elevated blood-pressure reading, without diagnosis of hypertension: Secondary | ICD-10-CM | POA: Diagnosis not present

## 2023-08-05 DIAGNOSIS — E782 Mixed hyperlipidemia: Secondary | ICD-10-CM

## 2023-08-05 DIAGNOSIS — R7303 Prediabetes: Secondary | ICD-10-CM

## 2023-08-05 DIAGNOSIS — Z6838 Body mass index (BMI) 38.0-38.9, adult: Secondary | ICD-10-CM | POA: Diagnosis not present

## 2023-08-05 MED ORDER — PHENTERMINE HCL 37.5 MG PO TABS
37.5000 mg | ORAL_TABLET | Freq: Every day | ORAL | 0 refills | Status: DC
Start: 2023-08-05 — End: 2023-11-15

## 2023-08-05 MED ORDER — BUPROPION HCL ER (XL) 150 MG PO TB24: 150.0000 mg | ORAL_TABLET | Freq: Every day | ORAL | 3 refills | Status: DC

## 2023-08-05 NOTE — Patient Instructions (Addendum)
We will restart the wellbutrin once a day formula to make it easier to remember to take this.  I have also restarted the phentermine.   We will plan to cut the dermatofibroma out as soon as possible.

## 2023-08-05 NOTE — Progress Notes (Signed)
Shawna Clamp, DNP, AGNP-c [facility]  [address] [place], [place] [address] [phone]  ESTABLISHED PATIENT- Chronic Health and/or Follow-Up Visit  Blood pressure 130/82, pulse 78, weight 252 lb 6.4 oz (114.5 kg), last menstrual period 08/01/2023.    Vernelle Wisner is a 32 y.o. year old female presenting today for evaluation and management of chronic conditions.   Depression Khloei reports running out of her wellbutrin "a while ago" and doing well without it for a while, but recently she tells me she has been struggling with depressive symptoms and emotional changes. She recently learned that the job she has been with for 10 years will likely be closing in the November and this is very upsetting for her. She feels this has triggered her depressive symptoms. She tells me that she does have difficulty remembering to take medications at times. She is open to one a day dosing of medication now that we know wellbutrin works for her. She is open to other options too. She does report she felt that the 150mg  dose was not really doing much.   Weight Iona was planning to start [facility] for weight management and risk reduction, but she has had a difficult time getting the medication. She did have success with her weight on phentermine and is open to retrying this. She also felt the medication was very helpful in management of her focus and attention. She has an appointment in February with the attention specialist.   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 She reports that it is slightly painful. She would like to have this removed again. We discussed cryotherapy, but she would prefer to have the area incised.   All ROS negative with exception of what is listed above.   PHYSICAL EXAM Physical Exam Vitals and nursing note reviewed.  Constitutional:      Appearance: Normal appearance. She is obese.  HENT:      Head: Normocephalic.  Eyes:     Pupils: Pupils are equal, round, and reactive to light.  Cardiovascular:     Rate and Rhythm: Normal rate and regular rhythm.     Pulses: Normal pulses.  Pulmonary:     Effort: Pulmonary effort is normal.  Musculoskeletal:     Right lower leg: No edema.     Left lower leg: No edema.  Skin:    General: Skin is warm and dry.     Capillary Refill: Capillary refill takes less than 2 seconds.       Neurological:     Mental Status: She is alert and oriented to person, place, and time.  Psychiatric:        Behavior: Behavior normal.     PLAN Problem List Items Addressed This Visit     Metabolic syndrome X    Chronic. Labs pending. Discussion of medication management that may be helpful for improved control. She has struggled with weight management and despite adding wellbutrin a year ago, there have been no changes noted. She has been approved for [facility] based on BMI, insulin resistance, and hyperlipemia history, but has not have success obtaining the medication due to issues with supply. Will resend a prescription for the medication today.       Relevant Medications   phentermine (ADIPEX-P) 37.5 MG tablet   Other Relevant Orders   Hemoglobin A1c (Completed)   CBC with Differential/Platelet (Completed)   Comprehensive metabolic panel (Completed)   Iron, TIBC and Ferritin Panel (Completed)  VITAMIN D 25 Hydroxy (Vit-D Deficiency, Fractures) (Completed)   LP+LDL Direct (Completed)   Bipolar affective disorder, currently depressed, moderate (HCC) - Primary    Chronic. She ran out of her wellbutrin a while ago and was initially doing well, but recent changes in her life have triggered new signs of depression. Strongly recommend restart wellbutrin for improved control. No alarm symptoms are present at this time.  - Restart wellbutrin. Increase to 300mg  dose after several weeks if mood is not improved.        Relevant Medications   buPROPion  (WELLBUTRIN XL) 150 MG 24 hr tablet   Elevated blood pressure reading    BP elevated in the office today with no history of HTN. Recommend at home monitoring of BP and report if readings are consistently higher than 120/80      Relevant Orders   Hemoglobin A1c (Completed)   CBC with Differential/Platelet (Completed)   Comprehensive metabolic panel (Completed)   Iron, TIBC and Ferritin Panel (Completed)   VITAMIN D 25 Hydroxy (Vit-D Deficiency, Fractures) (Completed)   LP+LDL Direct (Completed)   BMI 38.0-38.9,adult    Weight management discussed today. Recommend restart phentermine today while attempting to get wegovy doses needed. Diet and exercise recommendations also provided.       Relevant Medications   buPROPion (WELLBUTRIN XL) 150 MG 24 hr tablet   phentermine (ADIPEX-P) 37.5 MG tablet   Other Relevant Orders   Hemoglobin A1c (Completed)   CBC with Differential/Platelet (Completed)   Comprehensive metabolic panel (Completed)   Iron, TIBC and Ferritin Panel (Completed)   VITAMIN D 25 Hydroxy (Vit-D Deficiency, Fractures) (Completed)   LP+LDL Direct (Completed)   Mixed hyperlipidemia    Labs pending. Currently managed with diet and exercise. Consider start of statin therapy if not well controlled.       Relevant Medications   phentermine (ADIPEX-P) 37.5 MG tablet   Other Relevant Orders   Hemoglobin A1c (Completed)   CBC with Differential/Platelet (Completed)   Comprehensive metabolic panel (Completed)   Iron, TIBC and Ferritin Panel (Completed)   VITAMIN D 25 Hydroxy (Vit-D Deficiency, Fractures) (Completed)   LP+LDL Direct (Completed)   Pre-diabetes    Chronic. Repeat labs today.       Relevant Medications   phentermine (ADIPEX-P) 37.5 MG tablet   Other Relevant Orders   Hemoglobin A1c (Completed)   CBC with Differential/Platelet (Completed)   Comprehensive metabolic panel (Completed)   Iron, TIBC and Ferritin Panel (Completed)   VITAMIN D 25 Hydroxy (Vit-D  Deficiency, Fractures) (Completed)   LP+LDL Direct (Completed)   Other Visit Diagnoses     Vitamin D deficiency       Relevant Orders   VITAMIN D 25 Hydroxy (Vit-D Deficiency, Fractures) (Completed)       Return for 45 min procedure for dermatofibroma.  Shawna Clamp, DNP, AGNP-c

## 2023-08-06 LAB — CBC WITH DIFFERENTIAL/PLATELET
Basophils Absolute: 0.1 10*3/uL (ref 0.0–0.2)
Basos: 1 %
EOS (ABSOLUTE): 0.2 10*3/uL (ref 0.0–0.4)
Eos: 3 %
Hematocrit: 44.2 % (ref 34.0–46.6)
Hemoglobin: 14.2 g/dL (ref 11.1–15.9)
Immature Grans (Abs): 0 10*3/uL (ref 0.0–0.1)
Immature Granulocytes: 0 %
Lymphocytes Absolute: 2.2 10*3/uL (ref 0.7–3.1)
Lymphs: 32 %
MCH: 29.4 pg (ref 26.6–33.0)
MCHC: 32.1 g/dL (ref 31.5–35.7)
MCV: 92 fL (ref 79–97)
Monocytes Absolute: 0.5 10*3/uL (ref 0.1–0.9)
Monocytes: 7 %
Neutrophils Absolute: 3.9 10*3/uL (ref 1.4–7.0)
Neutrophils: 57 %
Platelets: 312 10*3/uL (ref 150–450)
RBC: 4.83 x10E6/uL (ref 3.77–5.28)
RDW: 12.1 % (ref 11.7–15.4)
WBC: 7 10*3/uL (ref 3.4–10.8)

## 2023-08-06 LAB — LP+LDL DIRECT
Cholesterol, Total: 221 mg/dL — ABNORMAL HIGH (ref 100–199)
HDL: 45 mg/dL (ref >39)
LDL Chol Calc (NIH): 152 mg/dL — ABNORMAL HIGH (ref 0–99)
LDL Direct: 156 mg/dL — ABNORMAL HIGH (ref 0–99)
Triglycerides: 135 mg/dL (ref 0–149)
VLDL Cholesterol Cal: 24 mg/dL (ref 5–40)

## 2023-08-06 LAB — COMPREHENSIVE METABOLIC PANEL
ALT: 15 IU/L (ref 0–32)
AST: 15 IU/L (ref 0–40)
Albumin: 4.6 g/dL (ref 3.9–4.9)
Alkaline Phosphatase: 79 IU/L (ref 44–121)
BUN/Creatinine Ratio: 13 (ref 9–23)
BUN: 10 mg/dL (ref 6–20)
Bilirubin Total: 0.3 mg/dL (ref 0.0–1.2)
CO2: 24 mmol/L (ref 20–29)
Calcium: 9.6 mg/dL (ref 8.7–10.2)
Chloride: 102 mmol/L (ref 96–106)
Creatinine, Ser: 0.75 mg/dL (ref 0.57–1.00)
Globulin, Total: 2.3 g/dL (ref 1.5–4.5)
Glucose: 103 mg/dL — ABNORMAL HIGH (ref 70–99)
Potassium: 4.7 mmol/L (ref 3.5–5.2)
Sodium: 141 mmol/L (ref 134–144)
Total Protein: 6.9 g/dL (ref 6.0–8.5)
eGFR: 109 mL/min/{1.73_m2} (ref >59)

## 2023-08-06 LAB — IRON,TIBC AND FERRITIN PANEL
Ferritin: 32 ng/mL (ref 15–150)
Iron Saturation: 13 % — ABNORMAL LOW (ref 15–55)
Iron: 52 ug/dL (ref 27–159)
Total Iron Binding Capacity: 413 ug/dL (ref 250–450)
UIBC: 361 ug/dL (ref 131–425)

## 2023-08-06 LAB — HEMOGLOBIN A1C
Est. average glucose Bld gHb Est-mCnc: 117 mg/dL
Hgb A1c MFr Bld: 5.7 % — ABNORMAL HIGH (ref 4.8–5.6)

## 2023-08-06 LAB — VITAMIN D 25 HYDROXY (VIT D DEFICIENCY, FRACTURES): Vit D, 25-Hydroxy: 28.6 ng/mL — ABNORMAL LOW (ref 30.0–100.0)

## 2023-08-11 ENCOUNTER — Other Ambulatory Visit: Payer: Self-pay | Admitting: Nurse Practitioner

## 2023-08-11 DIAGNOSIS — E611 Iron deficiency: Secondary | ICD-10-CM

## 2023-08-11 DIAGNOSIS — E559 Vitamin D deficiency, unspecified: Secondary | ICD-10-CM

## 2023-08-11 MED ORDER — IRON (FERROUS SULFATE) 325 (65 FE) MG PO TABS
325.0000 mg | ORAL_TABLET | ORAL | 1 refills | Status: DC
Start: 2023-08-11 — End: 2023-12-08

## 2023-08-11 MED ORDER — VITAMIN D (ERGOCALCIFEROL) 1.25 MG (50000 UNIT) PO CAPS: 50000.0000 [IU] | ORAL_CAPSULE | ORAL | 3 refills | Status: DC

## 2023-08-22 NOTE — Assessment & Plan Note (Addendum)
Chronic. She ran out of her wellbutrin a while ago and was initially doing well, but recent changes in her life have triggered new signs of depression. Strongly recommend restart wellbutrin for improved control. No alarm symptoms are present at this time.  - Restart wellbutrin. Increase to 300mg  dose after several weeks if mood is not improved.

## 2023-08-22 NOTE — Assessment & Plan Note (Signed)
Chronic. Repeat labs today.

## 2023-08-22 NOTE — Assessment & Plan Note (Signed)
Weight management discussed today. Recommend restart phentermine today while attempting to get wegovy doses needed. Diet and exercise recommendations also provided.

## 2023-08-22 NOTE — Assessment & Plan Note (Addendum)
Chronic. Labs pending. Discussion of medication management that may be helpful for improved control. She has struggled with weight management and despite adding wellbutrin a year ago, there have been no changes noted. She has been approved for [facility] based on BMI, insulin resistance, and hyperlipemia history, but has not have success obtaining the medication due to issues with supply. Will resend a prescription for the medication today.

## 2023-08-22 NOTE — Assessment & Plan Note (Signed)
Labs pending. Currently managed with diet and exercise. Consider start of statin therapy if not well controlled.

## 2023-08-22 NOTE — Assessment & Plan Note (Signed)
BP elevated in the office today with no history of HTN. Recommend at home monitoring of BP and report if readings are consistently higher than 120/80

## 2023-08-27 ENCOUNTER — Ambulatory Visit (INDEPENDENT_AMBULATORY_CARE_PROVIDER_SITE_OTHER): Payer: Medicaid Other | Admitting: Nurse Practitioner

## 2023-08-27 ENCOUNTER — Encounter: Payer: Self-pay | Admitting: Nurse Practitioner

## 2023-08-27 VITALS — BP 124/82 | HR 80 | Wt 246.8 lb

## 2023-08-27 DIAGNOSIS — D239 Other benign neoplasm of skin, unspecified: Secondary | ICD-10-CM | POA: Diagnosis not present

## 2023-09-07 ENCOUNTER — Encounter: Payer: Self-pay | Admitting: Nurse Practitioner

## 2023-09-07 NOTE — Progress Notes (Signed)
  Tollie Eth, DNP, AGNP-c [facility] [address] [place], [place] [address] [phone]   ACUTE VISIT- ESTABLISHED PATIENT  Blood pressure 124/82, pulse 80, weight 246 lb 12.8 oz (111.9 kg), last menstrual period 08/01/2023.  Subjective:  HPI Michele Avila is a 32 y.o. female presents to day for evaluation of acute concern(s).  Dermatofibroma removal.   Brayton Caves, with a history of dermatofibroma, presented for a follow-up visit regarding a persistent issue with scar tissue and recurrence. She reported no new symptoms or changes in her health status since the last visit.  ROS negative except for what is listed in HPI. History, Medications, Surgery, SDOH, and Family History reviewed and updated as appropriate.  Objective:  Physical Exam Vitals and nursing note reviewed.  Constitutional:      Appearance: Normal appearance.  HENT:     Head: Normocephalic.  Eyes:     Pupils: Pupils are equal, round, and reactive to light.  Cardiovascular:     Rate and Rhythm: Normal rate and regular rhythm.     Pulses: Normal pulses.     Heart sounds: Normal heart sounds.  Pulmonary:     Effort: Pulmonary effort is normal.     Breath sounds: Normal breath sounds.  Musculoskeletal:        General: Normal range of motion.     Cervical back: Normal range of motion.  Skin:    General: Skin is warm and dry.     Comments: Dermatofibroma recurrence noted on the right lateral upper thigh. Scar tissue is present. No alarm symptoms.   Neurological:     General: No focal deficit present.     Mental Status: She is alert and oriented to person, place, and time.  Psychiatric:        Mood and Affect: Mood normal.          Assessment & Plan:   Problem List Items Addressed This Visit     Dermatofibroma - Primary    Skin procedure completed.  Right lateral thigh.  Risks, benefits, and alternative treatments discussed and all questions were answered. Patient elected to proceed  and verbal consent obtained.  Area prepped and draped using sterile technique. Area locally anesthetized using 0.3 cc's of lidocaine 1% with epi with HCO3 at 3:1 Ratio.  Punch biopsy 5mm size utilized to remove the fibroma and surrounding tissue.  Sutures utilized to close the site with minimal bleeding. Two sutures placed.  Post Procedure Instructions: Wound care instructions discussed and patient was instructed to keep area clean and dry.  Signs and symptoms of infection discussed, patient agrees to contact the office ASAP should they occur.  Dressing change recommended daily. Patient informed that areas of cryotherapy possible will blister.  Encouraged avoidance of popping blister.  Application of  bacitracin recommended should blistering occur.  Signs and symptoms of infection discussed and patient encouraged to contact the clinic ASAP for concerns.  Patient may return in 7 days for suture removal.          Tollie Eth, DNP, AGNP-c

## 2023-09-07 NOTE — Assessment & Plan Note (Signed)
Skin procedure completed.  Right lateral thigh.  Risks, benefits, and alternative treatments discussed and all questions were answered. Patient elected to proceed and verbal consent obtained.  Area prepped and draped using sterile technique. Area locally anesthetized using 0.3 cc's of lidocaine 1% with epi with HCO3 at 3:1 Ratio.  Punch biopsy 5mm size utilized to remove the fibroma and surrounding tissue.  Sutures utilized to close the site with minimal bleeding. Two sutures placed.  Post Procedure Instructions: Wound care instructions discussed and patient was instructed to keep area clean and dry.  Signs and symptoms of infection discussed, patient agrees to contact the office ASAP should they occur.  Dressing change recommended daily. Patient informed that areas of cryotherapy possible will blister.  Encouraged avoidance of popping blister.  Application of  bacitracin recommended should blistering occur.  Signs and symptoms of infection discussed and patient encouraged to contact the clinic ASAP for concerns.  Patient may return in 7 days for suture removal.

## 2023-09-17 ENCOUNTER — Other Ambulatory Visit: Payer: Self-pay | Admitting: Nurse Practitioner

## 2023-09-17 ENCOUNTER — Other Ambulatory Visit (HOSPITAL_COMMUNITY): Payer: Self-pay

## 2023-09-17 DIAGNOSIS — R7303 Prediabetes: Secondary | ICD-10-CM

## 2023-09-17 DIAGNOSIS — Z6838 Body mass index (BMI) 38.0-38.9, adult: Secondary | ICD-10-CM

## 2023-09-17 DIAGNOSIS — E8881 Metabolic syndrome: Secondary | ICD-10-CM

## 2023-09-17 DIAGNOSIS — R030 Elevated blood-pressure reading, without diagnosis of hypertension: Secondary | ICD-10-CM

## 2023-09-17 DIAGNOSIS — E782 Mixed hyperlipidemia: Secondary | ICD-10-CM

## 2023-09-17 MED ORDER — WEGOVY 0.25 MG/0.5ML ~~LOC~~ SOAJ
0.2500 mg | SUBCUTANEOUS | 0 refills | Status: DC
Start: 2023-09-17 — End: 2023-12-08
  Filled 2023-09-17: qty 2, 28d supply, fill #0

## 2023-09-17 MED ORDER — ATORVASTATIN CALCIUM 20 MG PO TABS
20.0000 mg | ORAL_TABLET | Freq: Every day | ORAL | 3 refills | Status: DC
Start: 2023-09-17 — End: 2023-12-08
  Filled 2023-09-17: qty 90, 90d supply, fill #0

## 2023-09-27 ENCOUNTER — Telehealth: Payer: Self-pay

## 2023-09-27 NOTE — Telephone Encounter (Signed)
Key: [id number] Drug: Wegovy 0.25MG /0.5ML auto-injectors Form: [employer] Electronic Prior Authorization Form (2017 [place]CPDP) Determination: Wait for Determination

## 2023-09-28 NOTE — Telephone Encounter (Signed)
PA for [facility] denied bc chart notes did not explicit state her current diet and exercise plus that it will continue on GLP-1. Will you write an addendum for me to resubmit.

## 2023-09-28 NOTE — Telephone Encounter (Signed)
Pt will also need a BMI that is within 45 days of submitting.

## 2023-09-29 ENCOUNTER — Other Ambulatory Visit (HOSPITAL_COMMUNITY): Payer: Self-pay

## 2023-11-15 ENCOUNTER — Other Ambulatory Visit: Payer: Self-pay | Admitting: Nurse Practitioner

## 2023-11-15 DIAGNOSIS — E782 Mixed hyperlipidemia: Secondary | ICD-10-CM

## 2023-11-15 DIAGNOSIS — R7303 Prediabetes: Secondary | ICD-10-CM

## 2023-11-15 DIAGNOSIS — E8881 Metabolic syndrome: Secondary | ICD-10-CM

## 2023-11-15 DIAGNOSIS — Z6838 Body mass index (BMI) 38.0-38.9, adult: Secondary | ICD-10-CM

## 2023-11-15 NOTE — Telephone Encounter (Signed)
Is this okay to refill? 
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
## 2023-11-26 ENCOUNTER — Ambulatory Visit: Payer: Medicaid Other | Admitting: Neurology

## 2023-12-03 ENCOUNTER — Other Ambulatory Visit: Payer: Self-pay | Admitting: Medical Genetics
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
## 2023-12-08 ENCOUNTER — Encounter (HOSPITAL_COMMUNITY): Payer: Self-pay

## 2023-12-08 ENCOUNTER — Ambulatory Visit (INDEPENDENT_AMBULATORY_CARE_PROVIDER_SITE_OTHER): Payer: Medicaid Other

## 2023-12-08 ENCOUNTER — Telehealth: Payer: Medicaid Other | Admitting: Neurology

## 2023-12-08 ENCOUNTER — Encounter: Payer: Self-pay | Admitting: Neurology

## 2023-12-08 ENCOUNTER — Ambulatory Visit (HOSPITAL_COMMUNITY)
Admission: EM | Admit: 2023-12-08 | Discharge: 2023-12-08 | Disposition: A | Discharge status: Home / Self Care | Payer: Medicaid Other | Attending: Family Medicine | Admitting: Family Medicine

## 2023-12-08 DIAGNOSIS — R0602 Shortness of breath: Secondary | ICD-10-CM | POA: Diagnosis not present

## 2023-12-08 DIAGNOSIS — J4521 Mild intermittent asthma with (acute) exacerbation: Secondary | ICD-10-CM | POA: Diagnosis not present

## 2023-12-08 DIAGNOSIS — G43109 Migraine with aura, not intractable, without status migrainosus: Secondary | ICD-10-CM | POA: Diagnosis not present

## 2023-12-08 DIAGNOSIS — J069 Acute upper respiratory infection, unspecified: Secondary | ICD-10-CM | POA: Diagnosis not present

## 2023-12-08 MED ORDER — METHYLPREDNISOLONE 4 MG PO TBPK: ORAL_TABLET | ORAL | 0 refills | Status: DC

## 2023-12-08 MED ORDER — EMGALITY 120 MG/ML ~~LOC~~ SOAJ: 120.0000 mg | SUBCUTANEOUS | 11 refills | Status: DC

## 2023-12-08 MED ORDER — NURTEC 75 MG PO TBDP: 75.0000 mg | ORAL_TABLET | Freq: Every day | ORAL | 11 refills | Status: DC | PRN

## 2023-12-08 MED ORDER — HYDROCODONE BIT-HOMATROP MBR 5-1.5 MG/5ML PO SOLN: 5.0000 mL | Freq: Four times a day (QID) | ORAL | 0 refills | Status: DC | PRN

## 2023-12-08 NOTE — ED Provider Notes (Signed)
 [facility]   [id number] 12/08/23 Arrival Time: 0935  ASSESSMENT & PLAN:  1. SOB (shortness of breath)   2. Viral URI with cough   3. Mild intermittent asthma with acute exacerbation    Without resp distress. Discussed typical duration of likely viral illness. I have personally viewed and independently interpreted the imaging studies ordered this visit. CXR: no acute changes.  OTC symptom care as needed.  New Prescriptions   HYDROCODONE  BIT-HOMATROPINE (HYCODAN) 5-1.5 MG/5ML SYRUP    Take 5 mLs by mouth every 6 (six) hours as needed for cough.   METHYLPREDNISOLONE  (MEDROL  DOSEPAK) 4 MG TBPK TABLET    Take as directed.     Discharge Instructions      Be aware, your cough medication may cause drowsiness. Please do not drive, operate heavy machinery or make important decisions while on this medication, it can cloud your judgement.       Follow-up Information     Early, Adriane Albe, NP.   Specialty: Nurse Practitioner Why: As needed. Contact information: [address] [place] [address] [phone]         [facility] [place].   Specialty: Urgent Care Why: If worsening or failing to improve as anticipated. Contact information: [address] [place]  [address] [phone]                Reviewed expectations re: course of current medical issues. Questions answered. Outlined signs and symptoms indicating need for more acute intervention. Understanding verbalized. After Visit Summary given.   SUBJECTIVE: History from: Patient. Michele Avila is a 33 y.o. female. Reports: cough, nasal congestion, wheezing, fatigue, subj fever; abrupt onset; x 3-4 days. Does feel SOB at times, esp with wheezing. H/O asthma. Normal PO intake without n/v/d. No tx PTA.  OBJECTIVE:  Vitals:   12/08/23 1036  BP: 126/87  Pulse: 82  Resp: 16  Temp: 98.6 F (37 C)  TempSrc: Oral  SpO2: 96%    General  appearance: alert; no distress Eyes: PERRLA; EOMI; conjunctiva normal HENT: [place]; AT; with mild nasal congestion Neck: supple  Lungs: speaks full sentences without difficulty; unlabored; bilat exp wheezing present Extremities: no edema Skin: warm and dry Neurologic: normal gait Psychological: alert and cooperative; normal mood and affect    Allergies  Allergen Reactions   Camphor-Menthol  Swelling    SEVERE FACIAL SWELLING SEVERE FACIAL SWELLING   Methyl Salicylate Anaphylaxis   [name] [Camphor-Menthol ] Swelling    SEVERE FACIAL SWELLING   Yellow Dyes (Non-Tartrazine)     migraines   Latex Hives and Rash    Past Medical History:  Diagnosis Date   Anemia    Asthma    hx chronic bronchitis/exercise induced asthma as pre teen only   COVID 03/2021   no COVID vaccine, also had COVID in 2021   Encounter for medical examination to establish care 04/24/2022   Environmental allergies    Infection    UTI   Migraine    Nodule of skin of lower extremity 05/14/2022   Recent skin changes 04/24/2022   Seasonal allergies    Smoking trying to quit 04/23/2022   Sprain of anterior talofibular ligament of right ankle 10/28/2022   Wrist fracture, right    Social History   Socioeconomic History   Marital status: Significant Other    Spouse name: Not on file   Number of children: 1   Years of education: Not on file   Highest education level: Some college,  no degree  Occupational History    Employer: Teacher, English as a foreign language: [employer]  Tobacco Use   Smoking status: Every Day    Current packs/day: 0.15    Types: Cigarettes    Passive exposure: Current   Smokeless tobacco: Never   Tobacco comments:    down to 5 cigarrettes/day  Vaping Use   Vaping status: Never Used  Substance and Sexual Activity   Alcohol use: Yes    Comment: occassionally    Drug use: Yes    Types: Marijuana   Sexual activity: Yes    Birth control/protection: I.U.D.  Other Topics Concern   Not on file   Social History Narrative   Patient is right-handed. She lives with her boyfriend and son in a 2 story house. She drinks one beverage with caffeine 2x week. She is acitive at work, loading and unloading trucks.   Social Drivers of Corporate investment banker Strain: Not on file  Food Insecurity: Not on file  Transportation Needs: Not on file  Physical Activity: Not on file  Stress: Not on file  Social Connections: Unknown (03/24/2022)   Received from [facility], [facility]   Social Network    Social Network: Not on file  Intimate Partner Violence: Unknown (02/25/2022)   Received from [facility], [facility]   HITS    Physically Hurt: Not on file    Insult or Talk Down To: Not on file    Threaten Physical Harm: Not on file    Scream or Curse: Not on file   Family History  Problem Relation Age of Onset   Cancer Father        liver   Diabetes Father    Hypertension Father    Hypertension Mother    Hypertension Maternal Grandmother    Kidney disease Maternal Grandmother    Diabetes Paternal Grandmother    Hypertension Paternal Grandmother    Ulcers Brother    Crohn's disease Brother    Past Surgical History:  Procedure Laterality Date   CESAREAN SECTION  2008   CESAREAN SECTION N/A 06/15/2013   Procedure: CESAREAN SECTION;  Surgeon: Atlas Blank, DO;  Location: [facility];  Service: Obstetrics;  Laterality: N/A;   DILATION AND CURETTAGE OF UTERUS  08/2011   FOOT SURGERY  2017   INDUCED ABORTION       Afton Albright, MD 12/08/23 1207

## 2023-12-08 NOTE — Discharge Instructions (Signed)
 Be aware, your cough medication may cause drowsiness. Please do not drive, operate heavy machinery or make important decisions while on this medication, it can cloud your judgement.

## 2023-12-08 NOTE — ED Triage Notes (Signed)
 Patient presents with cough and nasal congestion x 4 days. Patient now has SOB. Patient reports fever and chills.

## 2023-12-17 ENCOUNTER — Other Ambulatory Visit (HOSPITAL_COMMUNITY)
Admission: RE | Admit: 2023-12-17 | Discharge: 2023-12-17 | Disposition: A | Discharge status: Home / Self Care | Payer: Self-pay | Source: Ambulatory Visit | Attending: Medical Genetics | Admitting: Medical Genetics

## 2023-12-22 DIAGNOSIS — R079 Chest pain, unspecified: Secondary | ICD-10-CM | POA: Diagnosis not present

## 2023-12-22 DIAGNOSIS — I10 Essential (primary) hypertension: Secondary | ICD-10-CM | POA: Diagnosis not present

## 2023-12-28 LAB — GENECONNECT MOLECULAR SCREEN: Genetic Analysis Overall Interpretation: NEGATIVE

## 2023-12-29 ENCOUNTER — Other Ambulatory Visit (HOSPITAL_COMMUNITY): Payer: Self-pay

## 2024-01-12 ENCOUNTER — Encounter: Payer: Medicaid Other | Attending: Psychology | Admitting: Psychology

## 2024-02-10 ENCOUNTER — Telehealth: Payer: Self-pay | Admitting: Pharmacist

## 2024-02-10 NOTE — Telephone Encounter (Signed)
 Pharmacy Patient Advocate Encounter  Received notification from [facility] that Prior Authorization for Emgality 120MG /ML auto-injectors (migraine) has been APPROVED from 02/10/2024 to 02/09/2025   PA #/Case ID/Reference #:  [id number]

## 2024-02-10 NOTE — Telephone Encounter (Signed)
 Pharmacy Patient Advocate Encounter   Received notification from CoverMyMeds that prior authorization for Emgality 120MG /ML auto-injectors (migraine) is required/requested.   Insurance verification completed.   The patient is insured through [facility] .   Per test claim: PA required; PA submitted to above mentioned insurance via CoverMyMeds Key/confirmation #/EOC  [id number] Status is pending

## 2024-03-06 ENCOUNTER — Encounter: Payer: Self-pay | Admitting: Family Medicine

## 2024-03-06 ENCOUNTER — Ambulatory Visit: Payer: Self-pay

## 2024-03-06 ENCOUNTER — Ambulatory Visit: Admitting: Family Medicine

## 2024-03-06 VITALS — BP 124/80 | HR 80 | Ht 69.0 in | Wt 232.4 lb

## 2024-03-06 DIAGNOSIS — M62830 Muscle spasm of back: Secondary | ICD-10-CM

## 2024-03-06 DIAGNOSIS — M549 Dorsalgia, unspecified: Secondary | ICD-10-CM | POA: Diagnosis not present

## 2024-03-06 MED ORDER — METAXALONE 800 MG PO TABS: 400.0000 mg | ORAL_TABLET | Freq: Three times a day (TID) | ORAL | 0 refills | Status: DC | PRN

## 2024-03-06 MED ORDER — MELOXICAM 15 MG PO TABS: 15.0000 mg | ORAL_TABLET | Freq: Every day | ORAL | 0 refills | Status: DC

## 2024-03-06 NOTE — Patient Instructions (Addendum)
 Stay well hydrated. Please work on quitting smoking.  Start meloxicam in place of ibuprofen. Don't take advil/motrin/ibuprofen, naproxen/aleve, BC or Goody powder while you are taking the meloxicam. You CAN take tylenol products along with it, if needed (Extra Strength tylenol). I think topical lidocaine patches can be helpful (look at the ingredients to ensure that SalonPas with lidocaine patches don't have any ingredients that you are allergic to in them (ie menthol).  You have a component of muscular pain (at the upper lumbar spine). Heat, stretches, and massage to the muscles can be helpful (don't massage over the spine). You can use metaxalone (skelaxin--a muscle relaxant) every 8 hours if needed. This likely won't do much for the midline pain, but can help with the muscular pain. This possibly can make you sleepy, so start with 1/2 tablet and use with caution.  If you develop fever, numbness, weakness, rash or other changes, you may need to be seen for re-evaluation.

## 2024-03-06 NOTE — Telephone Encounter (Signed)
  Chief Complaint: Back pain Symptoms: pain Frequency: constant X 5 days Pertinent Negatives: Patient denies fever, injury Disposition: [] ED /[] Urgent Care (no appt availability in office) / [x] Appointment(In office/virtual)/ []  [facility]/ [] Home Care/ [] Refused Recommended Disposition /[] [facility]/ []  Follow-up with PCP Additional Notes:  5 days ago woke up feeling like her entire spine is "bruised", no visual bruise. She has no injury. Pain from scull to tailbone, two areas worse mid center back and lower center back. Had nausea for 24 hours, never vomited and has resolved. Intermittently her legs will get "shaky". Pain at rest is 7/10, pain up to 10/10 when touching or moving. Ibprofen and compresses without relief. Requesting evaluation. No acute visits available with PCP, acute visit scheduled with an alternate provider. Educated on care advice as documented in protocol, patient verbalized understanding.   Copied from CRM [id number]. Topic: Clinical - Red Word Triage >> Mar 06, 2024  9:56 AM Zipporah Him wrote: Red Word that prompted transfer to Nurse Triage: Spine feels bruised, been hurting about 5 days. Thought maybe she slept on it wrong, feels bruised and sore tailbone to your neck, but no visual bruising. States she feels like her actual bones are hurting. Reason for Disposition  [1] SEVERE back pain (e.g., excruciating, unable to do any normal activities) AND [2] not improved 2 hours after pain medicine  Protocols used: Back Pain-A-AH

## 2024-03-06 NOTE — Progress Notes (Signed)
 Chief Complaint  Patient presents with   Back Pain    Started about 5 days ago along her spine, from top to bottom. Feels like her spine is bruised. She said her lower back looks like it is starting to bruise.    Messages from triage today: 5 days ago woke up feeling like her entire spine is "bruised", no visual bruise. She has no injury. Pain from skull to tailbone, two areas worse mid center back and lower center back. Had nausea for 24 hours, never vomited and has resolved. Intermittently her legs will get "shaky". Pain at rest is 7/10, pain up to 10/10 when touching or moving. Ibuprofen and compresses without relief.   Woke up on last week (Tues or Wed) with spine feeling very sore.  No change in activity the day before.Thought maybe she slept on it wrong. Pain has gotten worse One area that is more painful is just above the bra strap, and the other focal area of more intense pain is along the tailbone. Hurts to sit.  Doesn't hurt as much to stand.  No radiation of the pain. Denies numbness or tingling. Partner noted some slight bruising of tailbone today.  Tried heat, ice, ibuprofen. These didn't help. Took ibuprofen 400 mg about 3-4 times for a couple of days without much benefit. Took ibuprofen 400 mg this morning. Current pain is 7/10 with sitting, 10/10 to touch the area, or when she first sits down.   PMH, PSH, SH reviewed Migraines, bipolar, anxiety, HLD Marijuana daily (has odor of it in the room)  Outpatient Encounter Medications as of 03/06/2024  Medication Sig Note   buPROPion (WELLBUTRIN XL) 150 MG 24 hr tablet Take 1 tablet (150 mg total) by mouth daily.    Galcanezumab-gnlm (EMGALITY) 120 MG/ML SOAJ Inject 120 mg into the skin every 28 (twenty-eight) days.    ibuprofen (ADVIL) 200 MG tablet Take 400 mg by mouth every 6 (six) hours as needed. 03/06/2024: Took 400mg  at 8:30am   meloxicam (MOBIC) 15 MG tablet Take 1 tablet (15 mg total) by mouth daily. Take it with food.  Take it daily until your pain has resolved.    metaxalone (SKELAXIN) 800 MG tablet Take 0.5-1 tablets (400-800 mg total) by mouth 3 (three) times daily as needed for muscle spasms.    phentermine (ADIPEX-P) 37.5 MG tablet TAKE 1 TABLET(37.5 MG) BY MOUTH DAILY BEFORE BREAKFAST    albuterol (VENTOLIN HFA) 108 (90 Base) MCG/ACT inhaler Inhale 2 puffs into the lungs every 4 (four) hours as needed for wheezing or shortness of breath (or cough). (Patient not taking: Reported on 03/06/2024) 03/06/2024: As needed   EPINEPHrine 0.3 mg/0.3 mL IJ SOAJ injection Inject 0.3 mg into the muscle as needed for anaphylaxis. (Patient not taking: Reported on 03/06/2024)    ibuprofen (ADVIL) 800 MG tablet Take 1 tablet (800 mg total) by mouth every 8 (eight) hours as needed for fever. Alternate with Tylenol (Patient not taking: Reported on 03/06/2024)    Rimegepant Sulfate (NURTEC) 75 MG TBDP Take 1 tablet (75 mg total) by mouth daily as needed (Maximum 1 tablet in 24 hours). (Patient not taking: Reported on 03/06/2024)    [DISCONTINUED] esomeprazole (NEXIUM) 40 MG capsule Take 1 capsule (40 mg total) by mouth daily. (Patient not taking: Reported on 09/16/2019)    [DISCONTINUED] HYDROcodone bit-homatropine (HYCODAN) 5-1.5 MG/5ML syrup Take 5 mLs by mouth every 6 (six) hours as needed for cough.    [DISCONTINUED] loratadine (CLARITIN) 10 MG tablet Take 1  tablet (10 mg total) by mouth daily. (Patient not taking: Reported on 12/03/2019) 10/27/2022: As needed   [DISCONTINUED] methylPREDNISolone (MEDROL DOSEPAK) 4 MG TBPK tablet Take as directed.    No facility-administered encounter medications on file as of 03/06/2024.   NOT taking meloxicam or skelaxin prior to today's visit  Allergies  Allergen Reactions   Camphor-Menthol Swelling    SEVERE FACIAL SWELLING SEVERE FACIAL SWELLING   Methyl Salicylate Anaphylaxis   [name] [Camphor-Menthol] Swelling    SEVERE FACIAL SWELLING   Yellow Dyes (Non-Tartrazine)     migraines    Latex Hives and Rash    ROS: no f/c.  She had some nausea Wed/Thurs, resolved now. Never had any vomiting or diarrhea. No URI symptoms, chest pain, shortness of breath. No urinary complaints. No bleeding, bruising, rash Weight loss 20# with phentermine, and has been maintaining loss (still taking medication). See HPI    PHYSICAL EXAM:  BP 124/80   Pulse 80   Ht 5\' 9"  (1.753 m)   Wt 232 lb 6.4 oz (105.4 kg)   LMP 02/21/2024 (Approximate)   BMI 34.32 kg/m   Wt Readings from Last 3 Encounters:  03/06/24 232 lb 6.4 oz (105.4 kg)  08/27/23 246 lb 12.8 oz (111.9 kg)  08/05/23 252 lb 6.4 oz (114.5 kg)   Well-appearing female--appears comfortable when standing, in moderate discomfort with position changes/sitting. Upper thoracic, mid-thoracic and lower lumbar and sacral spine all have focal tenderness to palpation, superficially.   Overlying skin is normal. No erythema, warmth, bruising or lesions. Tender over upper lumbar paraspinous muscles. No trigger points elsewhere on back.   Heart: regular rate and rhythm Lungs: clear bilaterally Abdomen nontender Psych: normal mood, affect, hygiene and grooming Neuro: alert and oriented, cranial nerves grossly intact. Normal strength, sensation, DTR's, gait. Negative SLR.   ASSESSMENT/PLAN:  Acute midline back pain, unspecified back location - thoracic, lumbar and sacral. Mostly midline, some muscular tenderness in upper lumbar area. Unclear etiology. Check labs. NSAIDs/muscle relaxant - Plan: CBC with Differential/Platelet, Comprehensive metabolic panel with GFR, Sedimentation Rate, C-reactive protein, meloxicam (MOBIC) 15 MG tablet  Muscle spasm of back - Plan: metaxalone (SKELAXIN) 800 MG tablet  I spent 40 minutes dedicated to the care of this patient, including pre-visit review of records, face to face time, post-visit ordering of testing and documentation.  Last saw PCP 07/2023, with no f/u scheduled. Phentermine last prescribed  #90 in 10/2023 (filled last #30 3/25)--having been taking it monthly, regularly, since September.  Needs med check with PCP

## 2024-03-07 ENCOUNTER — Encounter: Payer: Self-pay | Admitting: Family Medicine

## 2024-03-07 LAB — CBC WITH DIFFERENTIAL/PLATELET
Basophils Absolute: 0.1 10*3/uL (ref 0.0–0.2)
Basos: 1 %
EOS (ABSOLUTE): 0.2 10*3/uL (ref 0.0–0.4)
Eos: 2 %
Hematocrit: 43.9 % (ref 34.0–46.6)
Hemoglobin: 13.9 g/dL (ref 11.1–15.9)
Immature Grans (Abs): 0 10*3/uL (ref 0.0–0.1)
Immature Granulocytes: 0 %
Lymphocytes Absolute: 3.3 10*3/uL — ABNORMAL HIGH (ref 0.7–3.1)
Lymphs: 37 %
MCH: 28.8 pg (ref 26.6–33.0)
MCHC: 31.7 g/dL (ref 31.5–35.7)
MCV: 91 fL (ref 79–97)
Monocytes Absolute: 0.5 10*3/uL (ref 0.1–0.9)
Monocytes: 6 %
Neutrophils Absolute: 4.8 10*3/uL (ref 1.4–7.0)
Neutrophils: 54 %
Platelets: 306 10*3/uL (ref 150–450)
RBC: 4.83 x10E6/uL (ref 3.77–5.28)
RDW: 12.4 % (ref 11.7–15.4)
WBC: 8.9 10*3/uL (ref 3.4–10.8)

## 2024-03-07 LAB — COMPREHENSIVE METABOLIC PANEL WITH GFR
ALT: 9 IU/L (ref 0–32)
AST: 17 IU/L (ref 0–40)
Albumin: 4.6 g/dL (ref 3.9–4.9)
Alkaline Phosphatase: 75 IU/L (ref 44–121)
BUN/Creatinine Ratio: 11 (ref 9–23)
BUN: 9 mg/dL (ref 6–20)
Bilirubin Total: 0.3 mg/dL (ref 0.0–1.2)
CO2: 21 mmol/L (ref 20–29)
Calcium: 9.9 mg/dL (ref 8.7–10.2)
Chloride: 101 mmol/L (ref 96–106)
Creatinine, Ser: 0.81 mg/dL (ref 0.57–1.00)
Globulin, Total: 2.4 g/dL (ref 1.5–4.5)
Glucose: 90 mg/dL (ref 70–99)
Potassium: 4.5 mmol/L (ref 3.5–5.2)
Sodium: 137 mmol/L (ref 134–144)
Total Protein: 7 g/dL (ref 6.0–8.5)
eGFR: 99 mL/min/{1.73_m2} (ref >59)

## 2024-03-07 LAB — C-REACTIVE PROTEIN: CRP: 2 mg/L (ref 0–10)

## 2024-03-07 LAB — SEDIMENTATION RATE: Sed Rate: 17 mm/h (ref 0–32)

## 2024-03-08 ENCOUNTER — Other Ambulatory Visit: Payer: Self-pay | Admitting: Family Medicine

## 2024-03-08 ENCOUNTER — Other Ambulatory Visit (HOSPITAL_COMMUNITY): Payer: Self-pay

## 2024-03-08 ENCOUNTER — Telehealth: Payer: Self-pay

## 2024-03-08 DIAGNOSIS — M549 Dorsalgia, unspecified: Secondary | ICD-10-CM

## 2024-03-08 NOTE — Telephone Encounter (Signed)
 Pharmacy Patient Advocate Encounter   Received notification from CoverMyMeds that prior authorization for Metaxalone 800MG  tablets is required/requested.   Insurance verification completed.   The patient is insured through [facility] MEDICAID.   Per test claim: PA required; PA submitted to above mentioned insurance via CoverMyMeds Key/confirmation #/EOC (Key: [id number])  Status is pending

## 2024-03-09 ENCOUNTER — Other Ambulatory Visit (HOSPITAL_COMMUNITY): Payer: Self-pay

## 2024-03-09 NOTE — Telephone Encounter (Signed)
 Pharmacy Patient Advocate Encounter  Received notification from  [facility] MEDICAID  that Prior Authorization for Metaxalone 800MG  tablets has been APPROVED from 4.16.25 to 4.16.26. Ran test claim, Copay is $RTS, RX WAS LAST FILLED ON 4.17.25. This test claim was processed through [facility]- copay amounts may vary at other pharmacies due to pharmacy/plan contracts, or as the patient moves through the different stages of their insurance plan.   PA #/Case ID/Reference #: (Key: [id number])

## 2024-03-20 ENCOUNTER — Encounter: Admitting: Nurse Practitioner

## 2024-03-21 ENCOUNTER — Telehealth: Payer: Self-pay | Admitting: Nurse Practitioner

## 2024-03-21 NOTE — Telephone Encounter (Signed)
 This patient no showed for their appointment on 03/20/24. Which of the following is necessary for this patient.   A) No follow-up necessary   B) Follow-up urgent. Locate Patient Immediately.   C) Follow-up necessary. Contact patient and Schedule visit in ____ Days.   D) Follow-up Advised. Contact patient and Schedule visit in ____ Days.   E) Please Send no show letter to patient. Charge no show fee if no show was a CPE.

## 2024-03-22 ENCOUNTER — Telehealth: Payer: Self-pay | Admitting: Pharmacy Technician

## 2024-03-22 NOTE — Telephone Encounter (Signed)
 Pharmacy Patient Advocate Encounter   Received notification from CoverMyMeds that prior authorization for NURTEC 75MG  is required/requested.   Insurance verification completed.   The patient is insured through [facility] MEDICAID .   Per test claim: PA required; PA submitted to above mentioned insurance via CoverMyMeds Key/confirmation #/EOC [id number] Status is pending

## 2024-03-23 ENCOUNTER — Encounter: Payer: Self-pay | Admitting: Nurse Practitioner

## 2024-03-31 ENCOUNTER — Other Ambulatory Visit (HOSPITAL_COMMUNITY): Payer: Self-pay

## 2024-03-31 NOTE — Telephone Encounter (Signed)
 Pharmacy Patient Advocate Encounter  Received notification from [employer] that Prior Authorization for NURTEC 75MG  has been APPROVED from 4.30.25 to 43.0.26. Ran test claim, Copay is $4. This test claim was processed through [facility]- copay amounts may vary at other pharmacies due to pharmacy/plan contracts, or as the patient moves through the different stages of their insurance plan.   PA #/Case ID/Reference #: [id number]

## 2024-04-11 ENCOUNTER — Ambulatory Visit (HOSPITAL_COMMUNITY)
Admission: EM | Admit: 2024-04-11 | Discharge: 2024-04-11 | Disposition: A | Discharge status: Home / Self Care | Attending: Nurse Practitioner | Admitting: Nurse Practitioner

## 2024-04-11 ENCOUNTER — Encounter (HOSPITAL_COMMUNITY): Payer: Self-pay

## 2024-04-11 DIAGNOSIS — L299 Pruritus, unspecified: Secondary | ICD-10-CM | POA: Diagnosis not present

## 2024-04-11 DIAGNOSIS — T7840XA Allergy, unspecified, initial encounter: Secondary | ICD-10-CM

## 2024-04-11 MED ORDER — METHYLPREDNISOLONE 4 MG PO TBPK: ORAL_TABLET | ORAL | 0 refills | Status: DC

## 2024-04-11 MED ORDER — DEXAMETHASONE SODIUM PHOSPHATE 10 MG/ML IJ SOLN
INTRAMUSCULAR | Status: AC
  Filled 2024-04-11: qty 1

## 2024-04-11 MED ORDER — METHYLPREDNISOLONE ACETATE 40 MG/ML IJ SUSP
INTRAMUSCULAR | Status: AC
  Filled 2024-04-11: qty 1

## 2024-04-11 MED ORDER — DEXAMETHASONE SODIUM PHOSPHATE 10 MG/ML IJ SOLN
10.0000 mg | Freq: Once | INTRAMUSCULAR | Status: AC
  Administered 2024-04-11: 10 mg via INTRAMUSCULAR

## 2024-04-11 NOTE — Discharge Instructions (Addendum)
 You were seen today for irritation at the corners of your mouth, which appears to be an allergic reaction, possibly triggered by a new toothpaste. You received an injection of Decadron  in the clinic which is a steroid used to help reduce the inflammation. You were also prescribed a Medrol  Dosepak to continue treatment at home and may take Benadryl  as needed for any ongoing symptoms. There are no signs of a serious allergic reaction at this time. If you experience any swelling of the face or throat, difficulty breathing, or worsening symptoms, go to the emergency room immediately. Follow up with your primary care provider if your symptoms do not improve or if you have any concerns. Avoid using the suspected toothpaste and any other new products on or around your mouth.

## 2024-04-11 NOTE — ED Provider Notes (Signed)
 [facility]    CSN: [id number] Arrival date & time: 04/11/24  1504      History   Chief Complaint Chief Complaint  Patient presents with   Mouth Lesions   Pruritis    HPI Michele Avila is a 33 y.o. female.   Michele Avila is a 33 y.o. female that presents with an acute onset of oral irritation and itching that began this morning after using a new toothpaste. She reports developing rawness and itchiness around the corners of her mouth and on her tongue shortly after brushing her teeth. The patient describes a "really raw and itchy" sensation at the corners of her mouth, which has persisted throughout the day. She initially noticed a "really, really bright red, almost like a rash" on her tongue, which has since improved but is still visible. Michele Avila reports experiencing generalized itchiness throughout her body without a visible rash. She has been taking Benadryl  throughout the day to manage her symptoms, which she describes as providing some relief. The patient typically uses [employer] toothpaste but used a new [employer] toothpaste this morning, which her partner had purchased. 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 Patient denies consuming any mangoes recently. The patient denies any sore throat or swelling associated with her current symptoms.  The following portions of the patient's history were reviewed and updated as appropriate: allergies, current medications, past family history, past medical history, past social history, past surgical history, and problem list.      Past Medical History:  Diagnosis Date   Anemia    Asthma    hx chronic bronchitis/exercise induced asthma as pre teen only   COVID 03/2021   no COVID vaccine, also had COVID in 2021   Encounter for medical examination to establish care 04/24/2022    Environmental allergies    Infection    UTI   Migraine    Nodule of skin of lower extremity 05/14/2022   Recent skin changes 04/24/2022   Seasonal allergies    Smoking trying to quit 04/23/2022   Sprain of anterior talofibular ligament of right ankle 10/28/2022   Wrist fracture, right     Patient Active Problem List   Diagnosis Date Noted   Influenza 11/18/2022   KP (keratosis pilaris) 11/11/2022   Dandruff in adult 11/11/2022   BMI 38.0-38.9,adult 10/30/2022   Mixed hyperlipidemia 10/30/2022   Pre-diabetes 10/30/2022   Panic 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   Dermatofibroma 08/10/2022   Acute left-sided low back pain without sciatica 06/11/2022   Elevated blood pressure reading 04/24/2022   Metabolic syndrome X 04/23/2022   Intractable migraine without aura and with status migrainosus 04/23/2022   Bipolar affective disorder, currently depressed, moderate (HCC) 04/23/2022    Past Surgical History:  Procedure Laterality Date   CESAREAN SECTION  2008   CESAREAN SECTION N/A 06/15/2013   Procedure: CESAREAN SECTION;  Surgeon: Atlas Blank, DO;  Location: [facility];  Service: Obstetrics;  Laterality: N/A;   DILATION AND CURETTAGE OF UTERUS  08/2011   FOOT SURGERY  2017   INDUCED ABORTION      OB History     Gravida  5   Para  2   Term  2   Preterm      AB  3   Living  2      SAB      IAB  2  Ectopic      Multiple      Live Births  1            Home Medications    Prior to Admission medications   Medication Sig Start Date End Date Taking? Authorizing Provider  methylPREDNISolone  (MEDROL  DOSEPAK) 4 MG TBPK tablet Take as directed 04/11/24  Yes Maryruth Sol, FNP  albuterol  (VENTOLIN  HFA) 108 (90 Base) MCG/ACT inhaler Inhale 2 puffs into the lungs every 4 (four) hours as needed for wheezing or shortness of breath (or cough). Patient not taking: Reported on 03/06/2024 02/24/23   Carlee Charters, NP  buPROPion  (WELLBUTRIN  XL) 150 MG 24 hr tablet Take 1  tablet (150 mg total) by mouth daily. 08/05/23   Early, Sara E, NP  EPINEPHrine  0.3 mg/0.3 mL IJ SOAJ injection Inject 0.3 mg into the muscle as needed for anaphylaxis. Patient not taking: Reported on 03/06/2024 08/24/22   Sherel Dikes, PA-C  Galcanezumab -gnlm (EMGALITY ) 120 MG/ML SOAJ Inject 120 mg into the skin every 28 (twenty-eight) days. 12/08/23   Merriam Abbey, DO  ibuprofen  (ADVIL ) 200 MG tablet Take 400 mg by mouth every 6 (six) hours as needed.    [provider]  ibuprofen  (ADVIL ) 800 MG tablet Take 1 tablet (800 mg total) by mouth every 8 (eight) hours as needed for fever. Alternate with Tylenol  Patient not taking: Reported on 03/06/2024 11/13/22   Annella Kief, NP  phentermine  (ADIPEX-P ) 37.5 MG tablet TAKE 1 TABLET(37.5 MG) BY MOUTH DAILY BEFORE BREAKFAST 11/15/23   Early, Sara E, NP  Rimegepant Sulfate (NURTEC) 75 MG TBDP Take 1 tablet (75 mg total) by mouth daily as needed (Maximum 1 tablet in 24 hours). Patient not taking: Reported on 03/06/2024 12/08/23   Merriam Abbey, DO  esomeprazole  (NEXIUM ) 40 MG capsule Take 1 capsule (40 mg total) by mouth daily. Patient not taking: Reported on 09/16/2019 07/12/19 10/12/19  Afton Albright, MD  loratadine  (CLARITIN ) 10 MG tablet Take 1 tablet (10 mg total) by mouth daily. Patient not taking: Reported on 12/03/2019 10/12/19 01/16/20  Dayton Evener, PA-C    Family History Family History  Problem Relation Age of Onset   Cancer Father        liver   Diabetes Father    Hypertension Father    Hypertension Mother    Hypertension Maternal Grandmother    Kidney disease Maternal Grandmother    Diabetes Paternal Grandmother    Hypertension Paternal Grandmother    Ulcers Brother    Crohn's disease Brother     Social History Social History   Tobacco Use   Smoking status: Every Day    Current packs/day: 0.15    Types: Cigarettes    Passive exposure: Current (Trying to quit)   Smokeless tobacco: Never   Tobacco comments:     down to 5 cigarrettes/day  Vaping Use   Vaping status: Never Used  Substance Use Topics   Alcohol use: Yes    Comment: occassionally    Drug use: Yes    Types: Marijuana    Comment: daily     Allergies   Camphor-menthol , Methyl salicylate, [name] [camphor-menthol ], Yellow dyes (non-tartrazine), and Latex   Review of Systems Review of Systems  HENT:  Positive for mouth sores. Negative for dental problem, sore throat and trouble swallowing.   Gastrointestinal:  Negative for nausea and vomiting.  Skin:  Negative for rash (generalized itching).  All other systems reviewed and are negative.    Physical  Exam Triage Vital Signs ED Triage Vitals  Encounter Vitals Group     BP 04/11/24 1512 117/77     Systolic BP Percentile --      Diastolic BP Percentile --      Pulse Rate 04/11/24 1512 84     Resp 04/11/24 1512 16     Temp 04/11/24 1512 98.8 F (37.1 C)     Temp Source 04/11/24 1512 Oral     SpO2 04/11/24 1512 100 %     Weight --      Height --      Head Circumference --      Peak Flow --      Pain Score 04/11/24 1516 4     Pain Loc --      Pain Education --      Exclude from Growth Chart --    No data found.  Updated Vital Signs BP 117/77 (BP Location: Right Arm)   Pulse 84   Temp 98.8 F (37.1 C) (Oral)   Resp 16   LMP 03/17/2024 (Approximate)   SpO2 100%   Visual Acuity Right Eye Distance:   Left Eye Distance:   Bilateral Distance:    Right Eye Near:   Left Eye Near:    Bilateral Near:     Physical Exam Vitals reviewed.  Constitutional:      General: She is awake. She is not in acute distress.    Appearance: Normal appearance. She is well-developed. She is not ill-appearing, toxic-appearing or diaphoretic.  HENT:     Head: Normocephalic.     Mouth/Throat:     Lips: Pink.     Mouth: Mucous membranes are moist. No oral lesions.     Tongue: No lesions.     Palate: No lesions.     Pharynx: Oropharynx is clear. Uvula midline.     Comments:  Small, erythematous rounded lesions noted to the bilateral oral commissure. No intraoral lesions noted.  Eyes:     Conjunctiva/sclera: Conjunctivae normal.  Cardiovascular:     Rate and Rhythm: Normal rate and regular rhythm.     Heart sounds: Normal heart sounds.  Pulmonary:     Effort: Pulmonary effort is normal.     Breath sounds: Normal breath sounds.  Musculoskeletal:        General: Normal range of motion.  Skin:    General: Skin is warm and dry.     Findings: No rash.  Neurological:     General: No focal deficit present.     Mental Status: She is alert and oriented to person, place, and time.  Psychiatric:        Behavior: Behavior is cooperative.      UC Treatments / Results  Labs (all labs ordered are listed, but only abnormal results are displayed) Labs Reviewed - No data to display  EKG   Radiology No results found.  Procedures Procedures (including critical care time)  Medications Ordered in UC Medications  dexamethasone  (DECADRON ) injection 10 mg (has no administration in time range)    Initial Impression / Assessment and Plan / UC Course  I have reviewed the triage vital signs and the nursing notes.  Pertinent labs & imaging results that were available during my care of the patient were reviewed by me and considered in my medical decision making (see chart for details).    33 yo female presenting with oral irritation and diffused itching that started after using a new toothpaste. The patient is afebrile,  nontoxic, and in no acute distress. Exam reveals small, erythematous, rounded lesions at the bilateral oral commissures with no intraoral lesions or other abnormalities. No skin rash or other eruptions noted. Symptoms are likely due to an acute allergic reaction given the rapid onset following use of a new toothpaste. No signs of anaphylaxis are present. Decadron  IM was administered in clinic, and a Medrol  Dosepak was prescribed. The patient may continue  Benadryl  as needed. Follow-up with primary care provider as needed. Emergency precautions were reviewed.  Today's evaluation has revealed no signs of a dangerous process. Discussed diagnosis with patient and/or guardian. Patient and/or guardian aware of their diagnosis, possible red flag symptoms to watch out for and need for close follow up. Patient and/or guardian understands verbal and written discharge instructions. Patient and/or guardian comfortable with plan and disposition.  Patient and/or guardian has a clear mental status at this time, good insight into illness (after discussion and teaching) and has clear judgment to make decisions regarding their care  Documentation was completed with the aid of voice recognition software. Transcription may contain typographical errors. Final Clinical Impressions(s) / UC Diagnoses   Final diagnoses:  Minor allergic reaction, initial encounter  Pruritus     Discharge Instructions      You were seen today for irritation at the corners of your mouth, which appears to be an allergic reaction, possibly triggered by a new toothpaste. You received an injection of Decadron  in the clinic which is a steroid used to help reduce the inflammation. You were also prescribed a Medrol  Dosepak to continue treatment at home and may take Benadryl  as needed for any ongoing symptoms. There are no signs of a serious allergic reaction at this time. If you experience any swelling of the face or throat, difficulty breathing, or worsening symptoms, go to the emergency room immediately. Follow up with your primary care provider if your symptoms do not improve or if you have any concerns. Avoid using the suspected toothpaste and any other new products on or around your mouth.    ED Prescriptions     Medication Sig Dispense Auth. Provider   methylPREDNISolone  (MEDROL  DOSEPAK) 4 MG TBPK tablet Take as directed 21 tablet Maryruth Sol, FNP      PDMP not reviewed this  encounter.   Maryruth Sol, [place] 04/11/24 [phone]

## 2024-04-11 NOTE — ED Triage Notes (Signed)
 Patient here today with c/o mouth sores and itching on all over that started this morning. Patient states that she did recently changed her toothpaste and has had this happen before from eating mango but has not eaten any mango that she knows of.

## 2024-05-16 ENCOUNTER — Other Ambulatory Visit: Payer: Self-pay

## 2024-05-16 ENCOUNTER — Inpatient Hospital Stay (HOSPITAL_COMMUNITY)
Admission: AD | Admit: 2024-05-16 | Discharge: 2024-05-16 | Disposition: A | Discharge status: Home / Self Care | Attending: Obstetrics & Gynecology | Admitting: Obstetrics & Gynecology

## 2024-05-16 ENCOUNTER — Encounter (HOSPITAL_COMMUNITY): Payer: Self-pay | Admitting: *Deleted

## 2024-05-16 ENCOUNTER — Inpatient Hospital Stay (HOSPITAL_COMMUNITY)

## 2024-05-16 DIAGNOSIS — O3680X0 Pregnancy with inconclusive fetal viability, not applicable or unspecified: Secondary | ICD-10-CM | POA: Diagnosis not present

## 2024-05-16 DIAGNOSIS — R109 Unspecified abdominal pain: Secondary | ICD-10-CM

## 2024-05-16 DIAGNOSIS — O26891 Other specified pregnancy related conditions, first trimester: Secondary | ICD-10-CM | POA: Diagnosis not present

## 2024-05-16 DIAGNOSIS — Z3A08 8 weeks gestation of pregnancy: Secondary | ICD-10-CM | POA: Diagnosis not present

## 2024-05-16 DIAGNOSIS — O26899 Other specified pregnancy related conditions, unspecified trimester: Secondary | ICD-10-CM

## 2024-05-16 LAB — CBC
HCT: 38.5 % (ref 36.0–46.0)
Hemoglobin: 12.4 g/dL (ref 12.0–15.0)
MCH: 29.2 pg (ref 26.0–34.0)
MCHC: 32.2 g/dL (ref 30.0–36.0)
MCV: 90.6 fL (ref 80.0–100.0)
Platelets: 267 10*3/uL (ref 150–400)
RBC: 4.25 MIL/uL (ref 3.87–5.11)
RDW: 13.5 % (ref 11.5–15.5)
WBC: 6 10*3/uL (ref 4.0–10.5)
nRBC: 0 % (ref 0.0–0.2)

## 2024-05-16 LAB — WET PREP, GENITAL
Clue Cells Wet Prep HPF POC: NONE SEEN
Sperm: NONE SEEN
Trich, Wet Prep: NONE SEEN
WBC, Wet Prep HPF POC: <10 (ref <10)
Yeast Wet Prep HPF POC: NONE SEEN

## 2024-05-16 LAB — POCT PREGNANCY, URINE: Preg Test, Ur: POSITIVE — AB

## 2024-05-16 LAB — ABO/RH
ABO/RH(D): O NEG
Antibody Screen: NEGATIVE

## 2024-05-16 LAB — HIV ANTIBODY (ROUTINE TESTING W REFLEX): HIV Screen 4th Generation wRfx: NONREACTIVE

## 2024-05-16 LAB — RPR: RPR Ser Ql: NONREACTIVE

## 2024-05-16 LAB — HCG, QUANTITATIVE, PREGNANCY: hCG, Beta Chain, Quant, S: 2244 m[IU]/mL — ABNORMAL HIGH (ref <5)

## 2024-05-16 NOTE — Discharge Instructions (Signed)

## 2024-05-16 NOTE — MAU Note (Signed)
 Michele Avila is a 33 y.o. at Unknown here in MAU reporting: she's having pain in LLQ for past three days.  States pain worsens with coughing and standing, feels like I'm going to explode. Also states her legs are weak and her tailbone hurts.  Denies VB.    LMP: 03/20/2024 Onset of complaint: 3 days ago Pain score: 10 Vitals:   05/16/24 0934  BP: (!) 143/83  Pulse: 72  Resp: 18  Temp: 97.9 F (36.6 C)  SpO2: 100%     FHT: NA  Lab orders placed from triage: UPT & UA

## 2024-05-16 NOTE — MAU Provider Note (Cosign Needed Addendum)
 History     CSN: [id number]  Arrival date and time: 05/16/24 0840   None     Chief Complaint  Patient presents with   Abdominal Pain   HPI Ms. Michele Avila is a 33 y.o. year old [id number] female at [redacted]w[redacted]d weeks gestation who presents to MAU reporting pain in lower LT quadrant x 3 days. She reports the pain worsens with walking and standing; feels like I'm going to explode. Pain rated 10/10. She reports her legs are weak and shaky for the past 24-48 hours. She states, my legs are preventing me from being able to work. She reports her tailbone hurts also. She denies VB.    OB History     Gravida  6   Para  2   Term  2   Preterm      AB  3   Living  2      SAB      IAB  2   Ectopic      Multiple      Live Births  1           Past Medical History:  Diagnosis Date   Anemia    Asthma    hx chronic bronchitis/exercise induced asthma as pre teen only   COVID 03/2021   no COVID vaccine, also had COVID in 2021   Encounter for medical examination to establish care 04/24/2022   Environmental allergies    Infection    UTI   Migraine    Nodule of skin of lower extremity 05/14/2022   Recent skin changes 04/24/2022   Seasonal allergies    Smoking trying to quit 04/23/2022   Sprain of anterior talofibular ligament of right ankle 10/28/2022   Wrist fracture, right     Past Surgical History:  Procedure Laterality Date   CESAREAN SECTION  2008   CESAREAN SECTION N/A 06/15/2013   Procedure: CESAREAN SECTION;  Surgeon: Donna Just, DO;  Location: [facility];  Service: Obstetrics;  Laterality: N/A;   DILATION AND CURETTAGE OF UTERUS  08/2011   FOOT SURGERY  2017   INDUCED ABORTION      Family History  Problem Relation Age of Onset   Cancer Father        liver   Diabetes Father    Hypertension Father    Hypertension Mother    Hypertension Maternal Grandmother    Kidney disease Maternal Grandmother    Diabetes Paternal Grandmother    Hypertension  Paternal Grandmother    Ulcers Brother    Crohn's disease Brother     Social History   Tobacco Use   Smoking status: Every Day    Current packs/day: 0.15    Types: Cigarettes    Passive exposure: Current (Trying to quit)   Smokeless tobacco: Never   Tobacco comments:    down to 5 cigarrettes/day  Vaping Use   Vaping status: Never Used  Substance Use Topics   Alcohol use: Yes    Comment: occassionally    Drug use: Yes    Types: Marijuana    Comment: daily    Allergies:  Allergies  Allergen Reactions   Camphor-Menthol  Swelling    SEVERE FACIAL SWELLING SEVERE FACIAL SWELLING   Methyl Salicylate Anaphylaxis   [name] [Camphor-Menthol ] Swelling    SEVERE FACIAL SWELLING   Yellow Dyes (Non-Tartrazine)     migraines   Latex Hives and Rash    Medications Prior to Admission  Medication Sig Dispense Refill Last Dose/Taking  albuterol  (VENTOLIN  HFA) 108 (90 Base) MCG/ACT inhaler Inhale 2 puffs into the lungs every 4 (four) hours as needed for wheezing or shortness of breath (or cough). (Patient not taking: Reported on 03/06/2024) 18 g 0    buPROPion  (WELLBUTRIN  XL) 150 MG 24 hr tablet Take 1 tablet (150 mg total) by mouth daily. 90 tablet 3    EPINEPHrine  0.3 mg/0.3 mL IJ SOAJ injection Inject 0.3 mg into the muscle as needed for anaphylaxis. (Patient not taking: Reported on 03/06/2024) 1 each 0    Galcanezumab -gnlm (EMGALITY ) 120 MG/ML SOAJ Inject 120 mg into the skin every 28 (twenty-eight) days. 1.12 mL 11    ibuprofen  (ADVIL ) 200 MG tablet Take 400 mg by mouth every 6 (six) hours as needed.      ibuprofen  (ADVIL ) 800 MG tablet Take 1 tablet (800 mg total) by mouth every 8 (eight) hours as needed for fever. Alternate with Tylenol  (Patient not taking: Reported on 03/06/2024) 30 tablet 0    methylPREDNISolone  (MEDROL  DOSEPAK) 4 MG TBPK tablet Take as directed 21 tablet 0    phentermine  (ADIPEX-P ) 37.5 MG tablet TAKE 1 TABLET(37.5 MG) BY MOUTH DAILY BEFORE BREAKFAST 90 tablet 0     Rimegepant Sulfate (NURTEC) 75 MG TBDP Take 1 tablet (75 mg total) by mouth daily as needed (Maximum 1 tablet in 24 hours). (Patient not taking: Reported on 03/06/2024) 8 tablet 11     Review of Systems  Constitutional: Negative.   HENT: Negative.    Eyes: Negative.   Respiratory: Negative.    Cardiovascular: Negative.   Gastrointestinal: Negative.   Endocrine: Negative.   Genitourinary:  Positive for pelvic pain (lower LT pelvic area).  Musculoskeletal:        Weak legs & tailbone pain  Skin: Negative.   Allergic/Immunologic: Negative.   Neurological: Negative.   Hematological: Negative.   Psychiatric/Behavioral: Negative.     Physical Exam   Blood pressure (!) 143/83, pulse 72, temperature 97.9 F (36.6 C), temperature source Oral, resp. rate 18, height 5' 9 (1.753 m), weight 108.4 kg, last menstrual period 03/20/2024, SpO2 100%.  Physical Exam Vitals and nursing note reviewed.  Constitutional:      Appearance: Normal appearance. She is obese.   Cardiovascular:     Rate and Rhythm: Normal rate.  Pulmonary:     Effort: Pulmonary effort is normal.  Genitourinary:    Comments: Swabs collected by patient using blind swab technique   Neurological:     Mental Status: She is alert and oriented to person, place, and time.   Psychiatric:        Mood and Affect: Mood normal.        Behavior: Behavior normal.        Thought Content: Thought content normal.        Judgment: Judgment normal.    MAU Course  Procedures  MDM CCUA UPT CBC ABO/Rh HCG Wet Prep GC/CT -- Results pending  RPR -- Results pending  OB U/S < 14 wks TVUS  Results for orders placed or performed during the hospital encounter of 05/16/24 (from the past 24 hours)  Pregnancy, urine POC     Status: Abnormal   Collection Time: 05/16/24  9:05 AM  Result Value Ref Range   Preg Test, Ur POSITIVE (A) NEGATIVE  Wet prep, genital     Status: None   Collection Time: 05/16/24  9:24 AM   Specimen:  PATH Cytology Cervicovaginal Ancillary Only  Result Value Ref Range   Yeast Wet  Prep HPF POC NONE SEEN NONE SEEN   Trich, Wet Prep NONE SEEN NONE SEEN   Clue Cells Wet Prep HPF POC NONE SEEN NONE SEEN   WBC, Wet Prep HPF POC <10 <10   Sperm NONE SEEN   ABO/Rh     Status: None   Collection Time: 05/16/24  9:33 AM  Result Value Ref Range   ABO/RH(D) O NEG    Antibody Screen      NEG Performed at [facility], [address], [place], [place] [address]   CBC     Status: None   Collection Time: 05/16/24  9:34 AM  Result Value Ref Range   WBC 6.0 4.0 - 10.5 K/uL   RBC 4.25 3.87 - 5.11 MIL/uL   Hemoglobin 12.4 12.0 - 15.0 g/dL   HCT 61.4 63.9 - 53.9 %   MCV 90.6 80.0 - 100.0 fL   MCH 29.2 26.0 - 34.0 pg   MCHC 32.2 30.0 - 36.0 g/dL   RDW 86.4 88.4 - 84.4 %   Platelets 267 150 - 400 K/uL   nRBC 0.0 0.0 - 0.2 %  hCG, quantitative, pregnancy     Status: Abnormal   Collection Time: 05/16/24  9:34 AM  Result Value Ref Range   hCG, Beta Chain, Quant, S 2,244 (H) <5 mIU/mL  RPR     Status: None   Collection Time: 05/16/24  9:34 AM  Result Value Ref Range   RPR Ser Ql NON REACTIVE NON REACTIVE    US  OB LESS THAN 14 WEEKS WITH OB TRANSVAGINAL Result Date: 05/16/2024 CLINICAL DATA:  Abdominal pain for 3 days. First trimester of pregnancy. EXAM: OBSTETRIC <14 WK US  AND TRANSVAGINAL OB US  TECHNIQUE: Both transabdominal and transvaginal ultrasound examinations were performed for complete evaluation of the gestation as well as the maternal uterus, adnexal regions, and pelvic cul-de-sac. Transvaginal technique was performed to assess early pregnancy. COMPARISON:  None Available. FINDINGS: Intrauterine gestational sac: Single Yolk sac:  Not Visualized. Embryo:  Not Visualized. Cardiac Activity: Not Visualized. MSD: 4.7 mm   5 w   2 d Subchorionic hemorrhage:  None visualized. Maternal uterus/adnexae: Ovaries are unremarkable. No free fluid is noted. IMPRESSION: Probable early intrauterine  gestational sac, but no yolk sac, fetal pole, or cardiac activity yet visualized. Recommend follow-up quantitative B-HCG levels and follow-up US  in 14 days to assess viability. This recommendation follows SRU consensus guidelines: Diagnostic Criteria for Nonviable Pregnancy Early in the First Trimester. LOISE Diedra PARAS Med 2013; 630:8556-48. Electronically Signed   By: Lynwood Landy Raddle M.D.   On: 05/16/2024 11:18      Assessment and Plan  1. Abdominal pain affecting pregnancy, antepartum (Primary) - Information provided on abdominal pain in pregnancy   2. Pregnancy with uncertain fetal viability, single or unspecified fetus - Advised to have repeat U/S for viability in 2 weeks.  3. [redacted] weeks gestation of pregnancy   - Discharge home  - [place] [facility] Providers List given - Keep scheduled appt with [facility] on 06/15/2024 at 2:30 PM. Advised to arrive 15 mins prior to appt time for check in process. - Patient verbalized an understanding of the plan of care and agrees.   Ala Cart, CNM 05/16/2024, 12:16 PM

## 2024-05-17 LAB — GC/CHLAMYDIA PROBE AMP (~~LOC~~) NOT AT ARMC
Chlamydia: NEGATIVE
Comment: NEGATIVE
Comment: NORMAL
Neisseria Gonorrhea: NEGATIVE

## 2024-05-30 ENCOUNTER — Other Ambulatory Visit: Payer: Self-pay

## 2024-05-30 DIAGNOSIS — O26899 Other specified pregnancy related conditions, unspecified trimester: Secondary | ICD-10-CM

## 2024-05-31 ENCOUNTER — Other Ambulatory Visit: Payer: Self-pay

## 2024-05-31 DIAGNOSIS — Z3687 Encounter for antenatal screening for uncertain dates: Secondary | ICD-10-CM

## 2024-05-31 DIAGNOSIS — O3680X0 Pregnancy with inconclusive fetal viability, not applicable or unspecified: Secondary | ICD-10-CM

## 2024-05-31 DIAGNOSIS — O26899 Other specified pregnancy related conditions, unspecified trimester: Secondary | ICD-10-CM

## 2024-05-31 DIAGNOSIS — Z3A01 Less than 8 weeks gestation of pregnancy: Secondary | ICD-10-CM

## 2024-06-01 ENCOUNTER — Telehealth: Payer: Self-pay

## 2024-06-01 NOTE — Telephone Encounter (Signed)
 I sent the pt. A MyChart message explaining that Camie is out of town and will not be back until 06/12/24. I also gave [facility] [place].   Copied from CRM [id number]. Topic: Clinical - Medical Advice >> Jun 01, 2024  3:14 PM Graeme ORN wrote: Reason for CRM: Patient called. States she has a personal question for her provider. Would like to know what services are offered, if any for unplanned pregnancy. Would like a call back to discuss it with provider. Thank You

## 2024-06-02 ENCOUNTER — Ambulatory Visit: Payer: Self-pay | Admitting: Obstetrics and Gynecology
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
## 2024-06-06 ENCOUNTER — Ambulatory Visit: Payer: Medicaid Other | Admitting: Neurology

## 2024-08-03 ENCOUNTER — Ambulatory Visit: Admitting: Neurology
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
## 2024-11-28 ENCOUNTER — Ambulatory Visit (INDEPENDENT_AMBULATORY_CARE_PROVIDER_SITE_OTHER): Payer: Self-pay | Admitting: Neurology

## 2024-11-28 ENCOUNTER — Encounter: Payer: Self-pay | Admitting: Neurology

## 2024-11-28 VITALS — BP 133/86 | HR 78 | Ht 68.0 in | Wt 251.8 lb

## 2024-11-28 DIAGNOSIS — G43119 Migraine with aura, intractable, without status migrainosus: Secondary | ICD-10-CM

## 2024-11-28 MED ORDER — NURTEC 75 MG PO TBDP: 1.0000 | ORAL_TABLET | Freq: Every day | ORAL | 11 refills | Status: AC | PRN

## 2024-11-28 MED ORDER — QULIPTA 60 MG PO TABS: 60.0000 mg | ORAL_TABLET | Freq: Every day | ORAL | 11 refills | Status: AC

## 2024-11-28 MED ORDER — ONDANSETRON 8 MG PO TBDP: 8.0000 mg | ORAL_TABLET | Freq: Three times a day (TID) | ORAL | 5 refills | Status: AC | PRN

## 2024-11-28 NOTE — Progress Notes (Signed)
 Medication Samples have been provided to the patient.  Drug name: Elyxb       Strength: 120 mg        Qty: 2  LOT: [id number]  Exp.Date: 11/26  Dosing instructions: as needed  The patient has been instructed regarding the correct time, dose, and frequency of taking this medication, including desired effects and most common side effects.   Michele Avila Michele Avila 11:10 AM 11/28/2024    Medication Samples have been provided to the patient.  Drug name: Nurtec       Strength: 75 mg        Qty: 2  LOT: [id number] Michele/[id number]  Exp.Date: 11/28/5/28  Dosing instructions: as needed  The patient has been instructed regarding the correct time, dose, and frequency of taking this medication, including desired effects and most common side effects.   Michele Avila 11:11 AM 11/28/2024

## 2024-11-28 NOTE — Patient Instructions (Signed)
" °  Start QULIPTA  60MG  DAILY.  Contact us  in 3 months with update and we can increase dose if needed. Take NURTEC at earliest onset of headache.  Maximum 1 tablet in 24 hours.  If no improvement in 1 hour, try ELYXYB once in 24 hours.  Give me update.  Take ONDANSETRON  8MG  for nausea Limit use of pain relievers to no more than 9 days out of the month.  These medications include acetaminophen , NSAIDs (ibuprofen /Advil /Motrin , naproxen /Aleve , triptans (Imitrex /sumatriptan ), Excedrin, and narcotics.  This will help reduce risk of rebound headaches. Be aware of common food triggers  Routine exercise Stay adequately hydrated (aim for 64 oz water daily) Keep headache diary Maintain proper stress management Maintain proper sleep hygiene Do not skip meals Consider supplements:  magnesium  citrate 400mg  daily, riboflavin 400mg  daily, coenzyme Q10 100mg  three times daily.  "

## 2024-12-07 ENCOUNTER — Encounter (HOSPITAL_COMMUNITY): Payer: Self-pay

## 2024-12-07 ENCOUNTER — Ambulatory Visit (HOSPITAL_COMMUNITY): Admission: EM | Admit: 2024-12-07 | Discharge: 2024-12-07 | Disposition: A | Discharge status: Home / Self Care

## 2024-12-07 DIAGNOSIS — T148XXA Other injury of unspecified body region, initial encounter: Secondary | ICD-10-CM

## 2024-12-07 MED ORDER — BACLOFEN 10 MG PO TABS: 10.0000 mg | ORAL_TABLET | Freq: Two times a day (BID) | ORAL | 0 refills | Status: AC | PRN

## 2024-12-07 MED ORDER — LIDOCAINE 5 % EX PTCH: 1.0000 | MEDICATED_PATCH | CUTANEOUS | 0 refills | Status: AC

## 2024-12-07 NOTE — ED Provider Notes (Signed)
 " [facility]    CSN: [id number] Arrival date & time: 12/07/24  1208      History   Chief Complaint Chief Complaint  Patient presents with   Back Pain    HPI Aishah Teffeteller is a 34 y.o. female.   This 34 year old female is being seen for right side thoracic back pain.  She was getting up off of her couch last night when her dog jumped on her and she jerked back.  She had sudden onset pain right side thoracic spine.  She denies headache or dizziness.  She denies chest pain, shortness of breath.  She denies numbness, tingling, weakness in extremities.  She has taken ibuprofen  for her symptoms with minimal relief.   Back Pain Associated symptoms: no chest pain, no headaches, no numbness and no weakness     Past Medical History:  Diagnosis Date   Anemia    Asthma    hx chronic bronchitis/exercise induced asthma as pre teen only   COVID 03/2021   no COVID vaccine, also had COVID in 2021   Encounter for medical examination to establish care 04/24/2022   Environmental allergies    Infection    UTI   Migraine    Nodule of skin of lower extremity 05/14/2022   Recent skin changes 04/24/2022   Seasonal allergies    Smoking trying to quit 04/23/2022   Sprain of anterior talofibular ligament of right ankle 10/28/2022   Wrist fracture, right     Patient Active Problem List   Diagnosis Date Noted   Influenza 11/18/2022   KP (keratosis pilaris) 11/11/2022   Dandruff in adult 11/11/2022   BMI 38.0-38.9,adult 10/30/2022   Mixed hyperlipidemia 10/30/2022   Pre-diabetes 10/30/2022   Panic 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   Dermatofibroma 08/10/2022   Acute left-sided low back pain without sciatica 06/11/2022   Elevated blood pressure reading 04/24/2022   Metabolic syndrome X 04/23/2022   Intractable migraine without aura and with status migrainosus 04/23/2022   Bipolar affective disorder, currently depressed, moderate (HCC) 04/23/2022    Past Surgical History:   Procedure Laterality Date   CESAREAN SECTION  2008   CESAREAN SECTION N/A 06/15/2013   Procedure: CESAREAN SECTION;  Surgeon: Donna Just, DO;  Location: [facility];  Service: Obstetrics;  Laterality: N/A;   DILATION AND CURETTAGE OF UTERUS  08/2011   FOOT SURGERY  2017   INDUCED ABORTION      OB History     Gravida  6   Para  2   Term  2   Preterm      AB  3   Living  2      SAB      IAB  2   Ectopic      Multiple      Live Births  1            Home Medications    Prior to Admission medications  Medication Sig Start Date End Date Taking? Authorizing Provider  baclofen  (LIORESAL ) 10 MG tablet Take 1 tablet (10 mg total) by mouth every 12 (twelve) hours as needed for up to 5 days for muscle spasms. 12/07/24 12/12/24 Yes Ronal Maybury C, FNP  lidocaine  (LIDODERM ) 5 % Place 1 patch onto the skin daily. Apply one patch to painful are on back.  Remove & Discard patch within 12 hours. 12/07/24  Yes Damin Salido C, FNP  Atogepant  (QULIPTA ) 60 MG TABS Take 1 tablet (60 mg total) by mouth  daily. 11/28/24   Skeet Juliene SAUNDERS, DO  ondansetron  (ZOFRAN -ODT) 8 MG disintegrating tablet Take 1 tablet (8 mg total) by mouth every 8 (eight) hours as needed for nausea or vomiting. 11/28/24   Skeet Juliene SAUNDERS, DO  Rimegepant Sulfate (NURTEC) 75 MG TBDP Take 1 tablet (75 mg total) by mouth daily as needed. 11/28/24   Skeet Juliene SAUNDERS, DO  esomeprazole  (NEXIUM ) 40 MG capsule Take 1 capsule (40 mg total) by mouth daily. Patient not taking: Reported on 09/16/2019 07/12/19 10/12/19  Rolinda Rogue, MD  loratadine  (CLARITIN ) 10 MG tablet Take 1 tablet (10 mg total) by mouth daily. Patient not taking: Reported on 12/03/2019 10/12/19 01/16/20  Wieters, Hallie C, PA-C    Family History Family History  Problem Relation Age of Onset   Cancer Father        liver   Diabetes Father    Hypertension Father    Hypertension Mother    Hypertension Maternal Grandmother    Kidney disease Maternal Grandmother     Diabetes Paternal Grandmother    Hypertension Paternal Grandmother    Ulcers Brother    Crohn's disease Brother     Social History Social History[1]   Allergies   Camphor-menthol ; Methyl salicylate; [name] [camphor-menthol ]; Yellow dyes 6, 10, and 11; and Latex   Review of Systems Review of Systems  Constitutional:  Positive for activity change.  Respiratory:  Negative for shortness of breath.   Cardiovascular:  Negative for chest pain.  Musculoskeletal:  Positive for back pain.  Skin:  Negative for color change.  Neurological:  Negative for dizziness, weakness, numbness and headaches.     Physical Exam Triage Vital Signs ED Triage Vitals  Encounter Vitals Group     BP 12/07/24 1328 130/79     Girls Systolic BP Percentile --      Girls Diastolic BP Percentile --      Boys Systolic BP Percentile --      Boys Diastolic BP Percentile --      Pulse Rate 12/07/24 1328 71     Resp 12/07/24 1328 16     Temp 12/07/24 1328 99.6 F (37.6 C)     Temp Source 12/07/24 1328 Oral     SpO2 12/07/24 1328 96 %     Weight --      Height --      Head Circumference --      Peak Flow --      Pain Score 12/07/24 1325 10     Pain Loc --      Pain Education --      Exclude from Growth Chart --    No data found.  Updated Vital Signs BP 130/79 (BP Location: Right Arm)   Pulse 71   Temp 99.6 F (37.6 C) (Oral)   Resp 16   LMP 11/10/2024 (Exact Date)   SpO2 96%   Breastfeeding Unknown   Visual Acuity Right Eye Distance:   Left Eye Distance:   Bilateral Distance:    Right Eye Near:   Left Eye Near:    Bilateral Near:     Physical Exam Vitals and nursing note reviewed.  Constitutional:      General: She is not in acute distress.    Appearance: She is well-developed.     Comments: Female appearing stated age found sitting in chair in no acute distress.  Cardiovascular:     Rate and Rhythm: Normal rate and regular rhythm.     Heart sounds: Normal  heart sounds. No  murmur heard. Pulmonary:     Effort: Pulmonary effort is normal. No respiratory distress.     Breath sounds: Normal breath sounds.  Musculoskeletal:     Cervical back: Normal.     Thoracic back: Tenderness present.     Lumbar back: Normal.       Back:  Skin:    General: Skin is warm and dry.     Capillary Refill: Capillary refill takes less than 2 seconds.  Neurological:     Mental Status: She is alert.  Psychiatric:        Mood and Affect: Mood normal.      UC Treatments / Results  Labs (all labs ordered are listed, but only abnormal results are displayed) Labs Reviewed - No data to display  EKG   Radiology No results found.  Procedures Procedures (including critical care time)  Medications Ordered in UC Medications - No data to display  Initial Impression / Assessment and Plan / UC Course  I have reviewed the triage vital signs and the nursing notes.  Pertinent labs & imaging results that were available during my care of the patient were reviewed by me and considered in my medical decision making (see chart for details).     Was and triage reviewed, patient is hemodynamically stable.  Her presentation is consistent with thoracic muscle strain.  She has multiple allergies.  Due to this I am unable to give ketorolac  injection in clinic.  Her allergies limit use of muscle relaxers.  She is given prescriptions for baclofen , lidocaine  patches.  Advised supportive care with Tylenol , heat application, gentle stretches.  She is provided with orthopedic follow-up information.  Plan of care, follow-up care, return precautions given, no questions at this time. Final Clinical Impressions(s) / UC Diagnoses   Final diagnoses:  Muscle strain     Discharge Instructions      Your back pain is likely due to a muscle strain which will improve on its own with time.   You may take tylenol  as needed for aches and pains.  Take muscle relaxer as needed for muscle spasm, mostly  take this at bedtime as this medicine can cause drowsiness.  Apply lidocaine  patch to painful area, remove after 12 hours.  Apply heat to the pulled muscle 20 minutes on 20 minutes off as needed, heat relaxes muscles.  Perform gentle exercises and stretches to area of tenderness.  I would like for you to rest, however I do not want you to avoid moving the area. Movement and stretching will help with healing.  Red flag symptoms to watch out for are numbness/tingling to the extremeties, weakness, loss of bowel/bladder control, and/or worsening pain that does not respond well to medicines.  Orthopedic information for follow-up if needed: [facility] [address] [place], [place]  [address] [phone]  Follow-up with your primary care provider or return to urgent care if your symptoms do not improve in the next 3 to 4 days with medications and interventions recommended today. If your symptoms are severe (red flag), please go to the emergency room.       ED Prescriptions     Medication Sig Dispense Auth. Provider   baclofen  (LIORESAL ) 10 MG tablet Take 1 tablet (10 mg total) by mouth every 12 (twelve) hours as needed for up to 5 days for muscle spasms. 10 tablet Finnbar Cedillos C, FNP   lidocaine  (LIDODERM ) 5 % Place 1 patch onto the skin daily. Apply one patch to  painful are on back.  Remove & Discard patch within 12 hours. 30 patch Brilynn Biasi C, FNP      PDMP not reviewed this encounter.     [1]  Social History Tobacco Use   Smoking status: Every Day    Current packs/day: 0.15    Types: Cigarettes    Passive exposure: Current (Trying to quit)   Smokeless tobacco: Never   Tobacco comments:    down to 5 cigarrettes/day  Vaping Use   Vaping status: Never Used  Substance Use Topics   Alcohol use: Yes    Comment: occassionally    Drug use: Yes    Types: Marijuana    Comment: daily     Lennice Jon BROCKS, FNP 12/07/24 1455  "

## 2024-12-07 NOTE — Discharge Instructions (Addendum)
 Your back pain is likely due to a muscle strain which will improve on its own with time.   You may take tylenol  as needed for aches and pains.  Take muscle relaxer as needed for muscle spasm, mostly take this at bedtime as this medicine can cause drowsiness.  Apply lidocaine  patch to painful area, remove after 12 hours.  Apply heat to the pulled muscle 20 minutes on 20 minutes off as needed, heat relaxes muscles.  Perform gentle exercises and stretches to area of tenderness.  I would like for you to rest, however I do not want you to avoid moving the area. Movement and stretching will help with healing.  Red flag symptoms to watch out for are numbness/tingling to the extremeties, weakness, loss of bowel/bladder control, and/or worsening pain that does not respond well to medicines.  Orthopedic information for follow-up if needed: [facility] [address] [place], [place]  [address] [phone]  Follow-up with your primary care provider or return to urgent care if your symptoms do not improve in the next 3 to 4 days with medications and interventions recommended today. If your symptoms are severe (red flag), please go to the emergency room.

## 2024-12-07 NOTE — ED Triage Notes (Signed)
 Pt has c/o right upper back pain that started yesterday. Pt states she was getting off of couch yesterday and jerked back, and started getting worse since then. Pt has taken ibuprofen  at home with little relief. Last dose was yesterday night.

## 2024-12-08 ENCOUNTER — Telehealth: Payer: Self-pay

## 2024-12-08 ENCOUNTER — Other Ambulatory Visit (HOSPITAL_COMMUNITY): Payer: Self-pay

## 2024-12-08 NOTE — Telephone Encounter (Signed)
 PA needed for Nurtec.

## 2024-12-11 ENCOUNTER — Other Ambulatory Visit (HOSPITAL_COMMUNITY): Payer: Self-pay

## 2024-12-11 ENCOUNTER — Telehealth: Payer: Self-pay | Admitting: Pharmacy Technician

## 2024-12-11 NOTE — Telephone Encounter (Signed)
 Pharmacy Patient Advocate Encounter   Received notification from Pt Calls Messages that prior authorization for NURTEC 75MG  is required/requested.   Insurance verification completed.   The patient is insured through [facility] MEDICAID.   Per test claim: PA required; PA submitted to above mentioned insurance via Latent Key/confirmation #/EOC [id number] Status is pending

## 2024-12-13 ENCOUNTER — Other Ambulatory Visit (HOSPITAL_COMMUNITY): Payer: Self-pay

## 2024-12-13 NOTE — Telephone Encounter (Signed)
 Pharmacy Patient Advocate Encounter   PT DOES NOT HAVE COVERAGE UNDER MEDICAID  Received notification from Pt Calls Messages that prior authorization for NURTEC 75MG  is required/requested.   Insurance verification completed.   The patient is insured through [employer].   Per test claim: PA required; PA submitted to above mentioned insurance via Fax Key/confirmation #/EOC [id number] Status is pending

## 2024-12-13 NOTE — Telephone Encounter (Signed)
 PA has been submitted, and telephone encounter has been created. Please see telephone encounter dated 1.19.26.  No coverage found under medicaid. Submitted through [employer] on 1.21.26

## 2024-12-21 NOTE — Telephone Encounter (Signed)
 Letter received from [employer]  Nurtec approved 12/15/24-06/14/25.

## 2025-01-12 ENCOUNTER — Ambulatory Visit: Admitting: Neurology

## 2025-06-06 ENCOUNTER — Ambulatory Visit: Payer: Self-pay | Admitting: Neurology
